# Patient Record
Sex: Male | Born: 1940 | Race: White | Hispanic: No | Marital: Married | Smoking: Never smoker
Health system: Southern US, Community
[De-identification: ages and names within clinical notes are randomized; demographics above are authoritative.]

## PROBLEM LIST (undated history)

## (undated) DIAGNOSIS — E785 Hyperlipidemia, unspecified: Secondary | ICD-10-CM

## (undated) DIAGNOSIS — K219 Gastro-esophageal reflux disease without esophagitis: Secondary | ICD-10-CM

## (undated) DIAGNOSIS — R350 Frequency of micturition: Secondary | ICD-10-CM

## (undated) DIAGNOSIS — R351 Nocturia: Secondary | ICD-10-CM

## (undated) DIAGNOSIS — R3915 Urgency of urination: Secondary | ICD-10-CM

## (undated) DIAGNOSIS — N400 Enlarged prostate without lower urinary tract symptoms: Secondary | ICD-10-CM

## (undated) DIAGNOSIS — Z8619 Personal history of other infectious and parasitic diseases: Secondary | ICD-10-CM

## (undated) DIAGNOSIS — I10 Essential (primary) hypertension: Secondary | ICD-10-CM

---

## 1996-05-05 HISTORY — PX: OTHER SURGICAL HISTORY: SHX169

## 1997-12-19 ENCOUNTER — Ambulatory Visit (HOSPITAL_BASED_OUTPATIENT_CLINIC_OR_DEPARTMENT_OTHER): Admission: RE | Admit: 1997-12-19 | Discharge: 1997-12-19 | Payer: Self-pay | Admitting: Orthopaedic Surgery

## 1998-09-17 ENCOUNTER — Emergency Department (HOSPITAL_COMMUNITY): Admission: EM | Admit: 1998-09-17 | Discharge: 1998-09-17 | Payer: Self-pay | Admitting: Emergency Medicine

## 1998-09-17 ENCOUNTER — Encounter: Payer: Self-pay | Admitting: Emergency Medicine

## 2001-02-09 ENCOUNTER — Encounter: Payer: Self-pay | Admitting: Family Medicine

## 2001-02-09 ENCOUNTER — Ambulatory Visit (HOSPITAL_COMMUNITY): Admission: RE | Admit: 2001-02-09 | Discharge: 2001-02-09 | Payer: Self-pay | Admitting: Family Medicine

## 2001-02-22 ENCOUNTER — Ambulatory Visit (HOSPITAL_COMMUNITY): Admission: RE | Admit: 2001-02-22 | Discharge: 2001-02-22 | Payer: Self-pay | Admitting: Family Medicine

## 2001-02-22 ENCOUNTER — Encounter: Payer: Self-pay | Admitting: Family Medicine

## 2005-03-10 ENCOUNTER — Encounter: Admission: RE | Admit: 2005-03-10 | Discharge: 2005-03-10 | Payer: Self-pay | Admitting: Family Medicine

## 2011-07-18 DIAGNOSIS — IMO0002 Reserved for concepts with insufficient information to code with codable children: Secondary | ICD-10-CM | POA: Diagnosis not present

## 2011-07-18 DIAGNOSIS — M1710 Unilateral primary osteoarthritis, unspecified knee: Secondary | ICD-10-CM | POA: Diagnosis not present

## 2011-07-18 DIAGNOSIS — M76899 Other specified enthesopathies of unspecified lower limb, excluding foot: Secondary | ICD-10-CM | POA: Diagnosis not present

## 2011-08-26 DIAGNOSIS — R350 Frequency of micturition: Secondary | ICD-10-CM | POA: Diagnosis not present

## 2011-08-26 DIAGNOSIS — R82998 Other abnormal findings in urine: Secondary | ICD-10-CM | POA: Diagnosis not present

## 2011-08-26 DIAGNOSIS — Z125 Encounter for screening for malignant neoplasm of prostate: Secondary | ICD-10-CM | POA: Diagnosis not present

## 2011-08-26 DIAGNOSIS — R3911 Hesitancy of micturition: Secondary | ICD-10-CM | POA: Diagnosis not present

## 2011-09-09 DIAGNOSIS — H2510 Age-related nuclear cataract, unspecified eye: Secondary | ICD-10-CM | POA: Diagnosis not present

## 2011-10-03 DIAGNOSIS — N401 Enlarged prostate with lower urinary tract symptoms: Secondary | ICD-10-CM | POA: Diagnosis not present

## 2011-11-26 DIAGNOSIS — N401 Enlarged prostate with lower urinary tract symptoms: Secondary | ICD-10-CM | POA: Diagnosis not present

## 2011-11-28 DIAGNOSIS — N3000 Acute cystitis without hematuria: Secondary | ICD-10-CM | POA: Diagnosis not present

## 2011-11-28 DIAGNOSIS — R339 Retention of urine, unspecified: Secondary | ICD-10-CM | POA: Diagnosis not present

## 2011-11-28 DIAGNOSIS — N401 Enlarged prostate with lower urinary tract symptoms: Secondary | ICD-10-CM | POA: Diagnosis not present

## 2011-12-30 DIAGNOSIS — N401 Enlarged prostate with lower urinary tract symptoms: Secondary | ICD-10-CM | POA: Diagnosis not present

## 2011-12-30 DIAGNOSIS — R339 Retention of urine, unspecified: Secondary | ICD-10-CM | POA: Diagnosis not present

## 2012-01-06 DIAGNOSIS — N401 Enlarged prostate with lower urinary tract symptoms: Secondary | ICD-10-CM | POA: Diagnosis not present

## 2012-01-06 DIAGNOSIS — R339 Retention of urine, unspecified: Secondary | ICD-10-CM | POA: Diagnosis not present

## 2012-01-07 ENCOUNTER — Other Ambulatory Visit: Payer: Self-pay | Admitting: Urology

## 2012-02-03 ENCOUNTER — Encounter (HOSPITAL_BASED_OUTPATIENT_CLINIC_OR_DEPARTMENT_OTHER): Payer: Self-pay | Admitting: *Deleted

## 2012-02-03 NOTE — Progress Notes (Signed)
NPO AFTER MN. ARRVIES AT 0715. NEEDS ISTAT AND EKG. WILL TAKE NEXIUM AND NORVASC AM OF SURG W/ SIP OF WATER.

## 2012-02-05 ENCOUNTER — Other Ambulatory Visit: Payer: Self-pay

## 2012-02-05 ENCOUNTER — Ambulatory Visit (HOSPITAL_BASED_OUTPATIENT_CLINIC_OR_DEPARTMENT_OTHER): Payer: Medicare Other | Admitting: Anesthesiology

## 2012-02-05 ENCOUNTER — Encounter (HOSPITAL_BASED_OUTPATIENT_CLINIC_OR_DEPARTMENT_OTHER): Payer: Self-pay | Admitting: Anesthesiology

## 2012-02-05 ENCOUNTER — Observation Stay (HOSPITAL_BASED_OUTPATIENT_CLINIC_OR_DEPARTMENT_OTHER)
Admission: RE | Admit: 2012-02-05 | Discharge: 2012-02-07 | Disposition: A | Discharge status: Home / Self Care | Payer: Medicare Other | Source: Ambulatory Visit | Attending: Urology | Admitting: Urology

## 2012-02-05 ENCOUNTER — Encounter (HOSPITAL_COMMUNITY)
Admission: RE | Disposition: A | Discharge status: Home / Self Care | Payer: Self-pay | Source: Ambulatory Visit | Attending: Urology

## 2012-02-05 ENCOUNTER — Encounter (HOSPITAL_BASED_OUTPATIENT_CLINIC_OR_DEPARTMENT_OTHER): Payer: Self-pay | Admitting: *Deleted

## 2012-02-05 DIAGNOSIS — Z23 Encounter for immunization: Secondary | ICD-10-CM | POA: Diagnosis not present

## 2012-02-05 DIAGNOSIS — K219 Gastro-esophageal reflux disease without esophagitis: Secondary | ICD-10-CM | POA: Diagnosis not present

## 2012-02-05 DIAGNOSIS — N400 Enlarged prostate without lower urinary tract symptoms: Secondary | ICD-10-CM

## 2012-02-05 DIAGNOSIS — E785 Hyperlipidemia, unspecified: Secondary | ICD-10-CM | POA: Diagnosis not present

## 2012-02-05 DIAGNOSIS — Q549 Hypospadias, unspecified: Secondary | ICD-10-CM | POA: Diagnosis not present

## 2012-02-05 DIAGNOSIS — N401 Enlarged prostate with lower urinary tract symptoms: Principal | ICD-10-CM | POA: Insufficient documentation

## 2012-02-05 DIAGNOSIS — E669 Obesity, unspecified: Secondary | ICD-10-CM | POA: Insufficient documentation

## 2012-02-05 DIAGNOSIS — N312 Flaccid neuropathic bladder, not elsewhere classified: Secondary | ICD-10-CM | POA: Diagnosis not present

## 2012-02-05 DIAGNOSIS — I10 Essential (primary) hypertension: Secondary | ICD-10-CM | POA: Diagnosis not present

## 2012-02-05 DIAGNOSIS — R339 Retention of urine, unspecified: Secondary | ICD-10-CM | POA: Diagnosis not present

## 2012-02-05 DIAGNOSIS — Z79899 Other long term (current) drug therapy: Secondary | ICD-10-CM | POA: Diagnosis not present

## 2012-02-05 HISTORY — DX: Gastro-esophageal reflux disease without esophagitis: K21.9

## 2012-02-05 HISTORY — DX: Urgency of urination: R39.15

## 2012-02-05 HISTORY — DX: Essential (primary) hypertension: I10

## 2012-02-05 HISTORY — DX: Nocturia: R35.1

## 2012-02-05 HISTORY — DX: Personal history of other infectious and parasitic diseases: Z86.19

## 2012-02-05 HISTORY — DX: Hyperlipidemia, unspecified: E78.5

## 2012-02-05 HISTORY — DX: Frequency of micturition: R35.0

## 2012-02-05 HISTORY — PX: TRANSURETHRAL RESECTION OF PROSTATE: SHX73

## 2012-02-05 HISTORY — DX: Benign prostatic hyperplasia without lower urinary tract symptoms: N40.0

## 2012-02-05 LAB — POCT I-STAT, CHEM 8
BUN: 12 mg/dL (ref 6–23)
Calcium, Ion: 1.25 mmol/L (ref 1.13–1.30)
Chloride: 105 mEq/L (ref 96–112)
Creatinine, Ser: 0.9 mg/dL (ref 0.50–1.35)
Glucose, Bld: 112 mg/dL — ABNORMAL HIGH (ref 70–99)
HCT: 43 % (ref 39.0–52.0)
Hemoglobin: 14.6 g/dL (ref 13.0–17.0)
Potassium: 3.8 mEq/L (ref 3.5–5.1)
Sodium: 143 mEq/L (ref 135–145)
TCO2: 23 mmol/L (ref 0–100)

## 2012-02-05 SURGERY — TRANSURETHRAL RESECTION OF THE PROSTATE WITH GYRUS INSTRUMENTS
Anesthesia: General | Site: Bladder | Wound class: Clean Contaminated

## 2012-02-05 MED ORDER — LACTATED RINGERS IV SOLN
INTRAVENOUS | Status: DC
  Administered 2012-02-05 (×2): via INTRAVENOUS

## 2012-02-05 MED ORDER — DIPHENHYDRAMINE HCL 12.5 MG/5ML PO ELIX: 12.5000 mg | ORAL_SOLUTION | Freq: Four times a day (QID) | ORAL | Status: DC | PRN

## 2012-02-05 MED ORDER — CEFAZOLIN SODIUM-DEXTROSE 2-3 GM-% IV SOLR
2.0000 g | INTRAVENOUS | Status: AC
  Administered 2012-02-05: 2 g via INTRAVENOUS

## 2012-02-05 MED ORDER — GLYCOPYRROLATE 0.2 MG/ML IJ SOLN
INTRAMUSCULAR | Status: DC | PRN
  Administered 2012-02-05: 0.2 mg via INTRAVENOUS

## 2012-02-05 MED ORDER — ZOLPIDEM TARTRATE 5 MG PO TABS
5.0000 mg | ORAL_TABLET | Freq: Every evening | ORAL | Status: DC | PRN
  Administered 2012-02-05 – 2012-02-06 (×2): 5 mg via ORAL
  Filled 2012-02-05 (×2): qty 1

## 2012-02-05 MED ORDER — TAMSULOSIN HCL 0.4 MG PO CAPS
0.4000 mg | ORAL_CAPSULE | Freq: Every day | ORAL | Status: DC
  Administered 2012-02-05 – 2012-02-06 (×2): 0.4 mg via ORAL
  Filled 2012-02-05 (×3): qty 1

## 2012-02-05 MED ORDER — AMLODIPINE BESYLATE 5 MG PO TABS
5.0000 mg | ORAL_TABLET | Freq: Every morning | ORAL | Status: DC
  Administered 2012-02-05 – 2012-02-07 (×3): 5 mg via ORAL
  Filled 2012-02-05 (×3): qty 1

## 2012-02-05 MED ORDER — PANTOPRAZOLE SODIUM 40 MG PO TBEC
80.0000 mg | DELAYED_RELEASE_TABLET | Freq: Every day | ORAL | Status: DC
  Administered 2012-02-06 – 2012-02-07 (×2): 80 mg via ORAL
  Filled 2012-02-05 (×4): qty 2

## 2012-02-05 MED ORDER — FENTANYL CITRATE 0.05 MG/ML IJ SOLN
INTRAMUSCULAR | Status: DC | PRN
  Administered 2012-02-05 (×2): 25 ug via INTRAVENOUS
  Administered 2012-02-05: 50 ug via INTRAVENOUS

## 2012-02-05 MED ORDER — DIPHENHYDRAMINE HCL 50 MG/ML IJ SOLN: 12.5000 mg | Freq: Four times a day (QID) | INTRAMUSCULAR | Status: DC | PRN

## 2012-02-05 MED ORDER — INFLUENZA VIRUS VACC SPLIT PF IM SUSP
0.5000 mL | INTRAMUSCULAR | Status: AC
Start: 2012-02-06 — End: 2012-02-06
  Administered 2012-02-06: 0.5 mL via INTRAMUSCULAR
  Filled 2012-02-05: qty 0.5

## 2012-02-05 MED ORDER — CYCLOBENZAPRINE HCL 10 MG PO TABS
10.0000 mg | ORAL_TABLET | Freq: Three times a day (TID) | ORAL | Status: DC | PRN
  Filled 2012-02-05: qty 1

## 2012-02-05 MED ORDER — LIDOCAINE HCL (CARDIAC) 20 MG/ML IV SOLN
INTRAVENOUS | Status: DC | PRN
  Administered 2012-02-05: 80 mg via INTRAVENOUS

## 2012-02-05 MED ORDER — TEMAZEPAM 15 MG PO CAPS: 15.0000 mg | ORAL_CAPSULE | Freq: Every day | ORAL | Status: DC

## 2012-02-05 MED ORDER — FENTANYL CITRATE 0.05 MG/ML IJ SOLN
25.0000 ug | INTRAMUSCULAR | Status: DC | PRN
  Administered 2012-02-05 (×2): 25 ug via INTRAVENOUS

## 2012-02-05 MED ORDER — BISACODYL 5 MG PO TBEC: 5.0000 mg | DELAYED_RELEASE_TABLET | Freq: Every day | ORAL | Status: DC | PRN

## 2012-02-05 MED ORDER — MIDAZOLAM HCL 5 MG/5ML IJ SOLN
INTRAMUSCULAR | Status: DC | PRN
  Administered 2012-02-05: 1 mg via INTRAVENOUS

## 2012-02-05 MED ORDER — SIMVASTATIN 40 MG PO TABS
40.0000 mg | ORAL_TABLET | Freq: Every evening | ORAL | Status: DC
  Administered 2012-02-05: 40 mg via ORAL
  Filled 2012-02-05 (×2): qty 1

## 2012-02-05 MED ORDER — ONDANSETRON HCL 4 MG/2ML IJ SOLN: 4.0000 mg | INTRAMUSCULAR | Status: DC | PRN

## 2012-02-05 MED ORDER — HYDROCODONE-ACETAMINOPHEN 5-325 MG PO TABS
1.0000 | ORAL_TABLET | ORAL | Status: DC | PRN
Start: 2012-02-05 — End: 2012-02-07

## 2012-02-05 MED ORDER — PROPOFOL 10 MG/ML IV BOLUS
INTRAVENOUS | Status: DC | PRN
  Administered 2012-02-05: 150 mg via INTRAVENOUS

## 2012-02-05 MED ORDER — DEXTROSE-NACL 5-0.45 % IV SOLN
INTRAVENOUS | Status: DC
  Administered 2012-02-05 – 2012-02-06 (×3): via INTRAVENOUS

## 2012-02-05 MED ORDER — DEXAMETHASONE SODIUM PHOSPHATE 4 MG/ML IJ SOLN
INTRAMUSCULAR | Status: DC | PRN
  Administered 2012-02-05: 8 mg via INTRAVENOUS

## 2012-02-05 MED ORDER — BELLADONNA ALKALOIDS-OPIUM 16.2-60 MG RE SUPP
RECTAL | Status: DC | PRN
  Administered 2012-02-05: 1 via RECTAL

## 2012-02-05 MED ORDER — OXYBUTYNIN CHLORIDE 5 MG PO TABS
5.0000 mg | ORAL_TABLET | Freq: Three times a day (TID) | ORAL | Status: DC | PRN
  Administered 2012-02-05: 5 mg via ORAL
  Filled 2012-02-05 (×2): qty 1

## 2012-02-05 MED ORDER — CIPROFLOXACIN HCL 500 MG PO TABS
500.0000 mg | ORAL_TABLET | Freq: Two times a day (BID) | ORAL | Status: DC
  Administered 2012-02-05 – 2012-02-07 (×4): 500 mg via ORAL
  Filled 2012-02-05 (×6): qty 1

## 2012-02-05 MED ORDER — PANTOPRAZOLE SODIUM 40 MG PO TBEC: 40.0000 mg | DELAYED_RELEASE_TABLET | Freq: Every day | ORAL | Status: DC

## 2012-02-05 MED ORDER — PROMETHAZINE HCL 25 MG/ML IJ SOLN: 6.2500 mg | INTRAMUSCULAR | Status: DC | PRN

## 2012-02-05 MED ORDER — ACETAMINOPHEN 10 MG/ML IV SOLN
INTRAVENOUS | Status: DC | PRN
  Administered 2012-02-05: 1000 mg via INTRAVENOUS

## 2012-02-05 MED ORDER — HYDROMORPHONE HCL PF 1 MG/ML IJ SOLN: 0.5000 mg | INTRAMUSCULAR | Status: DC | PRN

## 2012-02-05 MED ORDER — ONDANSETRON HCL 4 MG/2ML IJ SOLN
INTRAMUSCULAR | Status: DC | PRN
  Administered 2012-02-05: 4 mg via INTRAVENOUS

## 2012-02-05 MED ORDER — BACITRACIN-NEOMYCIN-POLYMYXIN 400-5-5000 EX OINT: 1.0000 "application " | TOPICAL_OINTMENT | Freq: Three times a day (TID) | CUTANEOUS | Status: DC | PRN

## 2012-02-05 MED ORDER — SODIUM CHLORIDE 0.9 % IR SOLN
Status: DC | PRN
  Administered 2012-02-05: 6000 mL via INTRAVESICAL

## 2012-02-05 SURGICAL SUPPLY — 44 items
BAG DRAIN URO-CYSTO SKYTR STRL (DRAIN) ×2 IMPLANT
BAG URINE DRAINAGE (UROLOGICAL SUPPLIES) ×2 IMPLANT
BAG URINE LEG 19OZ MD ST LTX (BAG) ×2 IMPLANT
BLADE SURG 15 STRL LF DISP TIS (BLADE) IMPLANT
BLADE SURG 15 STRL SS (BLADE)
BOOTIES KNEE HIGH SLOAN (MISCELLANEOUS) ×2 IMPLANT
CANISTER SUCT LVC 12 LTR MEDI- (MISCELLANEOUS) ×6 IMPLANT
CATH AINSWORTH 30CC 24FR (CATHETERS) IMPLANT
CATH FOLEY 2WAY  5CC 16FR SIL (CATHETERS) ×1
CATH FOLEY 2WAY 5CC 16FR SIL (CATHETERS) ×1 IMPLANT
CATH FOLEY 2WAY SLVR  5CC 14FR (CATHETERS)
CATH FOLEY 2WAY SLVR  5CC 20FR (CATHETERS)
CATH FOLEY 2WAY SLVR 5CC 14FR (CATHETERS) IMPLANT
CATH FOLEY 2WAY SLVR 5CC 20FR (CATHETERS) IMPLANT
CATH HEMA 3WAY 30CC 24FR COUDE (CATHETERS) IMPLANT
CATH HEMA 3WAY 30CC 24FR RND (CATHETERS) ×2 IMPLANT
CATH SIMPLASTIC 24 30ML (CATHETERS) IMPLANT
CLOTH BEACON ORANGE TIMEOUT ST (SAFETY) ×2 IMPLANT
DRAPE CAMERA CLOSED 9X96 (DRAPES) ×2 IMPLANT
DRSG TEGADERM 4X4.75 (GAUZE/BANDAGES/DRESSINGS) ×6 IMPLANT
ELECT BUTTON HF 24-28F 2 30DE (ELECTRODE) IMPLANT
ELECT LOOP HF 24-28F (CUTTING LOOP) IMPLANT
ELECT LOOP HF 26F 30D .35MM (CUTTING LOOP) IMPLANT
ELECT LOOP MED HF 24F 12D CBL (CLIP) ×2 IMPLANT
ELECT NEEDLE 45D HF 24-28F 12D (CUTTING LOOP) IMPLANT
ELECT REM PT RETURN 9FT ADLT (ELECTROSURGICAL)
ELECTRODE REM PT RTRN 9FT ADLT (ELECTROSURGICAL) IMPLANT
GLOVE BIO SURGEON STRL SZ 6.5 (GLOVE) ×2 IMPLANT
GLOVE BIO SURGEON STRL SZ7.5 (GLOVE) ×2 IMPLANT
GLOVE ECLIPSE 6.0 STRL STRAW (GLOVE) ×2 IMPLANT
GOWN PREVENTION PLUS LG XLONG (DISPOSABLE) ×2 IMPLANT
GOWN STRL REIN XL XLG (GOWN DISPOSABLE) ×2 IMPLANT
HOLDER FOLEY CATH W/STRAP (MISCELLANEOUS) ×2 IMPLANT
IV NS IRRIG 3000ML ARTHROMATIC (IV SOLUTION) ×16 IMPLANT
KIT ASPIRATION TUBING (SET/KITS/TRAYS/PACK) ×2 IMPLANT
LOOP CUTTING 24FR OLYMPUS (CUTTING LOOP) IMPLANT
PACK CYSTOSCOPY (CUSTOM PROCEDURE TRAY) ×2 IMPLANT
PLUG CATH AND CAP STER (CATHETERS) ×2 IMPLANT
SET ASPIRATION TUBING (TUBING) IMPLANT
SPONGE GAUZE 4X4 12PLY (GAUZE/BANDAGES/DRESSINGS) ×2 IMPLANT
SUT ETHILON 3 0 PS 1 (SUTURE) IMPLANT
SUT SILK 0 TIES 10X30 (SUTURE) ×2 IMPLANT
SYR 30ML LL (SYRINGE) IMPLANT
SYRINGE IRR TOOMEY STRL 70CC (SYRINGE) ×2 IMPLANT

## 2012-02-05 NOTE — Anesthesia Procedure Notes (Signed)
Procedure Name: LMA Insertion Date/Time: 02/05/2012 8:55 AM Performed by: Norva Pavlov Pre-anesthesia Checklist: Patient identified, Emergency Drugs available, Suction available and Patient being monitored Patient Re-evaluated:Patient Re-evaluated prior to inductionOxygen Delivery Method: Circle System Utilized Preoxygenation: Pre-oxygenation with 100% oxygen Intubation Type: IV induction Ventilation: Mask ventilation without difficulty LMA: LMA inserted LMA Size: 4.0 Number of attempts: 1 Airway Equipment and Method: bite block Placement Confirmation: positive ETCO2 Tube secured with: Tape Dental Injury: Teeth and Oropharynx as per pre-operative assessment

## 2012-02-05 NOTE — Anesthesia Preprocedure Evaluation (Addendum)
Anesthesia Evaluation  Patient identified by MRN, date of birth, ID band Patient awake    Reviewed: Allergy & Precautions, H&P , NPO status , Patient's Chart, lab work & pertinent test results  Airway Mallampati: II TM Distance: >3 FB Neck ROM: Full    Dental  (+) Upper Dentures and Lower Dentures   Pulmonary neg pulmonary ROS,  breath sounds clear to auscultation  Pulmonary exam normal       Cardiovascular hypertension, Pt. on medications negative cardio ROS  Rhythm:Regular Rate:Normal     Neuro/Psych negative neurological ROS  negative psych ROS   GI/Hepatic Neg liver ROS, GERD-  Medicated,  Endo/Other  negative endocrine ROS  Renal/GU negative Renal ROS  negative genitourinary   Musculoskeletal negative musculoskeletal ROS (+)   Abdominal (+) + obese,   Peds negative pediatric ROS (+)  Hematology negative hematology ROS (+)   Anesthesia Other Findings   Reproductive/Obstetrics negative OB ROS                          Anesthesia Physical Anesthesia Plan  ASA: II  Anesthesia Plan: General   Post-op Pain Management:    Induction: Intravenous  Airway Management Planned: LMA  Additional Equipment:   Intra-op Plan:   Post-operative Plan: Extubation in OR  Informed Consent: I have reviewed the patients History and Physical, chart, labs and discussed the procedure including the risks, benefits and alternatives for the proposed anesthesia with the patient or authorized representative who has indicated his/her understanding and acceptance.   Dental advisory given  Plan Discussed with: CRNA  Anesthesia Plan Comments:         Anesthesia Quick Evaluation

## 2012-02-05 NOTE — Interval H&P Note (Signed)
History and Physical Interval Note:  02/05/2012 8:49 AM  Philip Allen  has presented today for surgery, with the diagnosis of BPH  The various methods of treatment have been discussed with the patient and family. After consideration of risks, benefits and other options for treatment, the patient has consented to  Procedure(s) (LRB) with comments: TRANSURETHRAL RESECTION OF THE PROSTATE WITH GYRUS INSTRUMENTS (N/A) - WITH GYRUS SUPRAPUBIC TUBE OWER TO MAIN as a surgical intervention .  The patient's history has been reviewed, patient examined, no change in status, stable for surgery.  I have reviewed the patient's chart and labs.  Questions were answered to the patient's satisfaction.     Jethro Bolus I

## 2012-02-05 NOTE — Transfer of Care (Signed)
Immediate Anesthesia Transfer of Care Note  Patient: Philip Allen  Procedure(s) Performed: Procedure(s) (LRB): TRANSURETHRAL RESECTION OF THE PROSTATE WITH GYRUS INSTRUMENTS (N/A)  Patient Location: PACU  Anesthesia Type: General  Level of Consciousness: drowsy, follows commands, oriented x3.  Airway & Oxygen Therapy: Patient Spontanous Breathing and Patient connected to face mask oxygen  Post-op Assessment: Report given to PACU RN and Post -op Vital signs reviewed and stable  Post vital signs: Reviewed and stable  Complications: No apparent anesthesia complications

## 2012-02-05 NOTE — H&P (Signed)
Chief Complaint  cc: Dr. Felipa Eth   Reason For Visit  F/u to review urodynamics & Renal u/s   Active Problems Problems  1. 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 Incomplete Emptying Of Bladder 788.21  History of Present Illness       71 year old male returns today to review urodynamic results & Renal u/s.  Hx of a 10 year history of urinary frequency, hesitancy, incomplete emptying, straining to urinate, weakened urinary flow, nocturia-despite taking Flomax for 3 years. He has been treated for possible prostatitis with Cipro 500 mg b.i.d. for 14 days. IPSS 21/7. He has failed a Rapaflo. PSA is 1.99 urine culture is negative. His past history is significant for elevated PVR of 800 cc without discomfort.  Physical examination shows normal sphincter tone with prostate size 3+. Repeat PSA is 2.3. Flow rate is 6 cc per second. PVR = 186 cc. The patient was felt to have probable obstruction, and is referred for urodynamics for evaluation.  Video urodynamics is accomplished on 12/30/11, in the sitting position. The maximum cystometric capacity is 1255 cc. The bladder is hyposensitive. First sensation occurs at 925 cc, and normal desire at 1118 cc. Strong desire to void occurs at 1118 cc. The bladder comes slightly unstable, and the patient is able to void off of these unstable bladder contractions.  The patient did not leak for leak point determination and abdominal pressure generation of 141 cm water.  Pressure flow study showed a maximum flow rate of 11 cc per second, with detrusor pressure at maximum flow of 100 cm water. He has some loss of compliance. PVR appears to be 1000 cc. He voids with an obstructed flow pattern. There is trabeculation, and elevation of the bladder base found.  Review of the Leisa Lenz nomogram shows obstruction. The patient is polyp self intermittent catheterization. He has definite large hyposensitive bladder.   Past Medical  History Problems  1. 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 History of  Esophageal Reflux 530.81 3. History of  Hepatitis 573.3 4. History of  Hyperlipidemia 272.4 5. History of  Hypertension 401.9  Surgical History Problems  1. History of  Rotator Cuff Repair Right  Current Meds 1. Amoxicillin-Pot Clavulanate 500-125 MG Oral Tablet; TAKE 3 TABLET 3 times daily; Therapy:  30Jul2013 to (Evaluate:09Aug2013)  Requested for: 30Jul2013; Last Rx:30Jul2013 2. Aspirin 81 MG Oral Tablet; Therapy: (Recorded:31May2013) to 3. Cyclobenzaprine HCl 10 MG Oral Tablet; Therapy: (Recorded:31May2013) to 4. Finasteride 5 MG Oral Tablet; TAKE ONE TABLET BY MOUTH DAILY; Therapy: 26Jul2013 to  (Evaluate:21Jul2014)  Requested for: 26Jul2013; Last Rx:26Jul2013 5. Flomax 0.4 MG Oral Capsule; Therapy: (Recorded:31May2013) to 6. Mobic 15 MG Oral Tablet; Therapy: (Recorded:31May2013) to 7. NexIUM 40 MG Oral Packet; Therapy: (Recorded:31May2013) to 8. Norvasc 5 MG Oral Tablet; Therapy: (Recorded:31May2013) to 9. Rapaflo 8 MG Oral Capsule; TAKE 1 CAPSULE DAILY WITH FOOD; Therapy: 31May2013 to  (Evaluate:07Oct2013); Last Rx:09Jul2013 10. Sulfamethoxazole-TMP DS 800-160 MG Oral Tablet; 1 tablet BID x 10 days, then 1/2 tablet daily x 1   mo; Therapy: 26Jul2013 to (Last Rx:26Jul2013)  Requested for: 26Jul2013 11. Temazepam 15 MG Oral Capsule; Therapy: (Recorded:31May2013) to 12. Zocor 40 MG Oral Tablet; Therapy: (Recorded:31May2013) to  Allergies Medication  1. No Known Drug Allergies  Family History Problems  1. Family history of  Family Health Status Number Of Children 2 girls and 1 boy 2. Family history of  Father Deceased At Age 48 heart disease 3. Family history of  Mother Deceased At Age  84 heart disease  Social History Problems  1. Alcohol Use 2 per week 2. Caffeine Use 3 per day 3. Marital History - Single 4. Never A Smoker 5. Occupation: Retired Denied  6. History of  Tobacco  Use  Review of Systems Genitourinary, constitutional, skin, eye, otolaryngeal, hematologic/lymphatic, cardiovascular, pulmonary, endocrine, musculoskeletal, gastrointestinal, neurological and psychiatric system(s) were reviewed and pertinent findings if present are noted.  Genitourinary: urinary frequency, difficulty starting the urinary stream, weak urinary stream, incomplete emptying of bladder, penile pain and initiating urination requires straining.  Musculoskeletal: back pain and joint pain.    Vitals Vital Signs [Data Includes: Last 1 Day]  03Sep2013 08:26AM  Blood Pressure: 155 / 79 Temperature: 97.9 F Heart Rate: 59  Results/Data Urine [Data Includes: Last 1 Day]   03Sep2013  COLOR YELLOW   APPEARANCE CLEAR   SPECIFIC GRAVITY 1.010   pH 7.0   GLUCOSE NEG mg/dL  BILIRUBIN NEG   KETONE NEG mg/dL  BLOOD NEG   PROTEIN NEG mg/dL  UROBILINOGEN 0.2 mg/dL  NITRITE NEG   LEUKOCYTE ESTERASE NEG    Assessment Assessed  1. 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 Incomplete Emptying Of Bladder 788.21      49.38cc gland size, with 6-12cc max flow rate, and urodynamic diagnosis of outflow obstruction, with hypotonic bladder, and elevated pvr. Cysto shows median lobe with elevated bladder neck. Urodynamics shows hypotonic bladder with markedly elevated pvr.   We have reviewed options for treatment. He has failed medical theapy, and needs relief of his obstruction, then it will take several weeks to months to try to get his bladder to return to normal. However, has normal neurologic status, and normal eye-hand coordination, and normal bowels, and I would anticipate that he has a good chance to recover his bladder function.  Mr. Chaikin needs cysto,Gyrus TURP and S-P tube. Then he will need PT post op.   Plan  Gyrus TUR-P and s-p tube.   Signatures Electronically signed by : Jethro Bolus, M.D.; Jan 06 2012  9:26AM

## 2012-02-05 NOTE — Anesthesia Postprocedure Evaluation (Signed)
  Anesthesia Post-op Note  Patient: Philip Allen  Procedure(s) Performed: Procedure(s) (LRB): TRANSURETHRAL RESECTION OF THE PROSTATE WITH GYRUS INSTRUMENTS (N/A)  Patient Location: PACU  Anesthesia Type: General  Level of Consciousness: awake and alert   Airway and Oxygen Therapy: Patient Spontanous Breathing  Post-op Pain: mild  Post-op Assessment: Post-op Vital signs reviewed, Patient's Cardiovascular Status Stable, Respiratory Function Stable, Patent Airway and No signs of Nausea or vomiting  Post-op Vital Signs: stable  Complications: No apparent anesthesia complications

## 2012-02-05 NOTE — Op Note (Signed)
Pre-operative diagnosis : BPH with hypotonic bladder Postoperative diagnosis: Same  Operation: Cystourethroscopy, suprapubic tube placement, TURP  Surgeon:  S. Patsi Sears, MD  First assistant: None  [id number]}  Preparation: After appropriate preanesthesia, the patient was brought to the operating room, placed on the operating table in the dorsal supine position where general LMA anesthesia was introduced. He was replaced on the operating table in the dorsal lithotomy position with pubis was prepped with Betadine solution and draped in usual fashion. The arm band was double checked.  Review history:71 year old male returns today to review urodynamic results & Renal u/s. Hx of a 10 year history of urinary frequency, hesitancy, incomplete emptying, straining to urinate, weakened urinary flow, nocturia-despite taking Flomax for 3 years. He has been treated for possible prostatitis with Cipro 500 mg b.i.d. for 14 days. IPSS 21/7. He has failed a Rapaflo. PSA is 1.99 urine culture is negative. His past history is significant for elevated PVR of 800 cc without discomfort.  Physical examination shows normal sphincter tone with prostate size 3+. Repeat PSA is 2.3. Flow rate is 6 cc per second. PVR = 186 cc. The patient was felt to have probable obstruction, and is referred for urodynamics for evaluation.  Video urodynamics is accomplished on 12/30/11, in the sitting position. The maximum cystometric capacity is 1255 cc. The bladder is hyposensitive. First sensation occurs at 925 cc, and normal desire at 1118 cc. Strong desire to void occurs at 1118 cc. The bladder comes slightly unstable, and the patient is able to void off of these unstable bladder contractions.  The patient did not leak for leak point determination and abdominal pressure generation of 141 cm water.  Pressure flow study showed a maximum flow rate of 11 cc per second, with detrusor pressure at maximum flow of 100 cm water. He has  some loss of compliance. PVR appears to be 1000 cc. He voids with an obstructed flow pattern. There is trabeculation, and elevation of the bladder base found.  Review of the Leisa Lenz nomogram shows obstruction. The patient is  has definite large hyposensitive bladder. He is for TURP and S-P tube, followed by PT, and bladder re-training.     Statement of  Likelihood of Success: Excellent. TIME-OUT observed.:  Procedure: Inspection of the penis revealed that the patient had distal shaft hypospadias, requiring urethral dilation to a size 26 [place] prior to introduction of the continuous flow resectoscope. This was accomplished, and the obturator removed. Photodocumentation of very obstructed prostatic fossa was noted, with bilobar BPH, and greatly enlarged median lobe. Trabeculation was photo documented. There was no evidence of bladder stone or tumor noted. The ureteral orifices were identified in normal position on the trigone.  The patient was placed in Trendelenburg position. The scope was removed. A Lowsley tractor was placed. Using a 15 blade, cutdown was accomplished where the Lowsley could be palpated through the skin, and the midline. The Lowsley was brought into the wound, and a 16 [place] Silastic catheter was placed in the urethra antegrade. The Lowsley was then removed, and the continuous flow resectoscope was placed within the urethra, and under direct vision, the Foley catheter was brought retrograde into the bladder. Once in the bladder, 10 cc of sterile water was placed in the balloon. At the end of the procedure, a 3-0 nylon suture was used to secure the suprapubic catheter to the skin.  Following placement of the SP tube, attention was directed to resection of the prostate. Resection was begun at the 7:00  position, carried to the 5:00 position, from the 1:00 position to the 6:00 position, and from the 11:00 to the 6 clock position. Chips were sent to laboratory for evaluation. The  resection was noted to be quite bloody, extensive cauterization was used. Irrigation was accomplished, and a 24 [place] hematuria catheter, was placed, with continuous irrigation, and traction. The patient was awakened and taken recovery room in good condition. He received IV Tylenol prior to the surgery.

## 2012-02-06 ENCOUNTER — Encounter (HOSPITAL_BASED_OUTPATIENT_CLINIC_OR_DEPARTMENT_OTHER): Payer: Self-pay | Admitting: Urology

## 2012-02-06 LAB — HEMOGLOBIN AND HEMATOCRIT, BLOOD
HCT: 32 % — ABNORMAL LOW (ref 39.0–52.0)
Hemoglobin: 11.4 g/dL — ABNORMAL LOW (ref 13.0–17.0)

## 2012-02-06 MED ORDER — CIPROFLOXACIN HCL 500 MG PO TABS: 500.0000 mg | ORAL_TABLET | Freq: Two times a day (BID) | ORAL | Status: DC

## 2012-02-06 MED ORDER — ATORVASTATIN CALCIUM 20 MG PO TABS
20.0000 mg | ORAL_TABLET | Freq: Every day | ORAL | Status: DC
  Administered 2012-02-06: 20 mg via ORAL
  Filled 2012-02-06 (×2): qty 1

## 2012-02-06 MED ORDER — HYDROCODONE-ACETAMINOPHEN 7.5-650 MG PO TABS: 1.0000 | ORAL_TABLET | Freq: Four times a day (QID) | ORAL | Status: DC | PRN

## 2012-02-06 NOTE — Discharge Summary (Signed)
Physician Discharge Summary  Patient ID: KYM FENTER MRN: [id number] DOB/AGE: 08-18-1940 71 y.o.  Admit date: 02/05/2012 Discharge date: 02/06/2012  Admission Diagnoses: Hypotonic bladder, chronic urinary obstruction  Discharge Diagnoses:  Hypotonic bladder Bladder outlet obstruction  Discharged Condition: good  Hospital Course: TURP/S-P tube   Consults: None  Significant Diagnostic Studies: labs: ( see labs)  Treatments: IV hydration, antibiotics: Ancef and surgery: TURP/S-P tube  Discharge Exam: Blood pressure 136/50, pulse 72, temperature 98.5 F (36.9 C), temperature source Oral, resp. rate 16, height 5\' 7"  (1.702 m), weight 89.35 kg (196 lb 15.7 oz), SpO2 95.00%. General appearance: alert, cooperative and appears stated age  Disposition:   For D/C Saturday with s-p training.      Medication List     As of 02/06/2012  1:40 PM    ASK your doctor about these medications         amLODipine 5 MG tablet   Commonly known as: NORVASC   Take 5 mg by mouth every morning.      aspirin 81 MG tablet   Take 81 mg by mouth every morning.      cyclobenzaprine 10 MG tablet   Commonly known as: FLEXERIL   Take 10 mg by mouth 3 (three) times daily as needed.      esomeprazole 40 MG capsule   Commonly known as: NEXIUM   Take 40 mg by mouth daily before breakfast.      silodosin 4 MG Caps capsule   Commonly known as: RAPAFLO   Take 8 mg by mouth daily with breakfast.      simvastatin 40 MG tablet   Commonly known as: ZOCOR   Take 40 mg by mouth every evening.      temazepam 15 MG capsule   Commonly known as: RESTORIL   Take 15 mg by mouth at bedtime.           Follow-up Information    Follow up with Jethro Bolus I, MD. (per appointment)    Contact information:   [address] [place] [address] [phone]          Signed: Jethro Bolus I 02/06/2012, 1:40 PM

## 2012-02-06 NOTE — Care Management Note (Addendum)
    Page 1 of 1   02/06/2012     11:22:29 AM   CARE MANAGEMENT NOTE 02/06/2012  Patient:  Philip Allen, Philip Allen   Account Number:  [id number]  Date Initiated:  02/06/2012  Documentation initiated by:  Lanier Clam  Subjective/Objective Assessment:   ADMITTED W/PROSTATE HYPERPLASIA     Action/Plan:   FROM HOME   Anticipated DC Date:  02/06/2012   Anticipated DC Plan:  HOME/SELF CARE         Choice offered to / List presented to:             Status of service:  Completed, signed off Medicare Important Message given?   (If response is "NO", the following Medicare IM given date fields will be blank) Date Medicare IM given:   Date Additional Medicare IM given:    Discharge Disposition:  HOME/SELF CARE  Per UR Regulation:  Reviewed for med. necessity/level of care/duration of stay  If discussed at Long Length of Stay Meetings, dates discussed:    Comments:

## 2012-02-06 NOTE — Progress Notes (Signed)
Urology Progress Note  1 Day Post-Op   Subjective: no clots, but mild hematuria. No s-p teaching yet.     No acute urologic events overnight. Ambulation:   positive Flatus:    negative Bowel movement  negative  Pain: some relief  Objective:  Blood pressure 136/50, pulse 72, temperature 98.5 F (36.9 C), temperature source Oral, resp. rate 16, height 5\' 7"  (1.702 m), weight 89.35 kg (196 lb 15.7 oz), SpO2 95.00%.  Physical Exam:  General:  No acute distress, awake Extremities: extremities normal, atraumatic, no cyanosis or edema Genitourinary:  Foley: pink urine. No clots.  Foley: 3-way. No clots    I/O last 3 completed shifts: In: 16109 [P.O.:720; I.V.:3310; Other:24700] Out: 60454 [Urine:29925]  Recent Labs  Basename 02/06/12 0333 02/05/12 0806   HGB 11.4* 14.6   WBC -- --   PLT -- --    Recent Labs  [facility] 02/05/12 0806   NA 143   K 3.8   CL 105   CO2 --   BUN 12   CREATININE 0.90   CALCIUM --   GFRNONAA --   GFRAA --     No results found for this basename: PT:2,INR:2,APTT:2 in the last 72 hours   No components found with this basename: ABG:2  Assessment/Plan:  Catheter not removed. Remove in AM. D/C in AM. Teach how to use s-p tube.   Follow up per appointment.

## 2012-02-07 NOTE — Progress Notes (Signed)
2 Days Post-Op Subjective: Patient reports no complaint.  Objective: Vital signs in last 24 hours: Temp:  [98.1 F (36.7 C)-98.5 F (36.9 C)] 98.5 F (36.9 C) (10/05 0523) Pulse Rate:  [58-72] 72  (10/05 0523) Resp:  [16] 16  (10/05 0523) BP: (125-132)/(64-70) 125/70 mmHg (10/05 0523) SpO2:  [96 %-97 %] 97 % (10/05 0523)  Intake/Output from previous day: 10/04 0701 - 10/05 0700 In: 4940 [P.O.:840; I.V.:1100] Out: 8225 [Urine:8225] Intake/Output this shift:    Physical Exam:  General: His suprapubic tube site looks good. The dressing is intact. His Foley catheter is draining. Urine is slightly blood-tinged but there are no clots. His abdomen is soft and nontender.  Lab Results:  Basename 02/06/12 0333 02/05/12 0806  HGB 11.4* 14.6  HCT 32.0* 43.0   BMET  Basename 02/05/12 0806  NA 143  K 3.8  CL 105  CO2 --  GLUCOSE 112*  BUN 12  CREATININE 0.90  CALCIUM --   No results found for this basename: LABPT:3,INR:3 in the last 72 hours No results found for this basename: LABURIN:1 in the last 72 hours No results found for this or any previous visit.  Studies/Results: No results found.  Assessment/Plan: He is doing well and ready for discharge. I will remove his urethral Foley catheter and he will be discharged with his suprapubic tube open to drainage.  DC urethral Foley  Discharged home with suprapubic tube.   LOS: 2 days   Philip Allen C 02/07/2012, 10:15 AM

## 2012-02-07 NOTE — Progress Notes (Signed)
Paged oncall Md regarding order to remove foley catheter.

## 2012-02-07 NOTE — Progress Notes (Signed)
Connected supra public to bag which is draining.  Removed foley catheter.  Provided teaching on S-P care.

## 2012-02-16 DIAGNOSIS — N401 Enlarged prostate with lower urinary tract symptoms: Secondary | ICD-10-CM | POA: Diagnosis not present

## 2012-03-02 DIAGNOSIS — R82998 Other abnormal findings in urine: Secondary | ICD-10-CM | POA: Diagnosis not present

## 2012-03-02 DIAGNOSIS — R339 Retention of urine, unspecified: Secondary | ICD-10-CM | POA: Diagnosis not present

## 2012-03-16 DIAGNOSIS — N3941 Urge incontinence: Secondary | ICD-10-CM | POA: Diagnosis not present

## 2012-04-11 ENCOUNTER — Emergency Department (INDEPENDENT_AMBULATORY_CARE_PROVIDER_SITE_OTHER)
Admission: EM | Admit: 2012-04-11 | Discharge: 2012-04-11 | Disposition: A | Discharge status: Home / Self Care | Payer: Medicare Other | Source: Home / Self Care | Attending: Family Medicine | Admitting: Family Medicine

## 2012-04-11 ENCOUNTER — Emergency Department (INDEPENDENT_AMBULATORY_CARE_PROVIDER_SITE_OTHER): Payer: Medicare Other

## 2012-04-11 ENCOUNTER — Encounter (HOSPITAL_COMMUNITY): Payer: Self-pay | Admitting: *Deleted

## 2012-04-11 DIAGNOSIS — S63509A Unspecified sprain of unspecified wrist, initial encounter: Secondary | ICD-10-CM | POA: Diagnosis not present

## 2012-04-11 DIAGNOSIS — M25539 Pain in unspecified wrist: Secondary | ICD-10-CM | POA: Diagnosis not present

## 2012-04-11 DIAGNOSIS — S63501A Unspecified sprain of right wrist, initial encounter: Secondary | ICD-10-CM

## 2012-04-11 NOTE — ED Provider Notes (Signed)
History     CSN: [id number]  Arrival date & time 04/11/12  1304   First MD Initiated Contact with Patient 04/11/12 1322      Chief Complaint  Patient presents with  . Wrist Injury    (Consider location/radiation/quality/duration/timing/severity/associated sxs/prior treatment) Patient is a 71 y.o. male presenting with wrist injury. The history is provided by the patient.  Wrist Injury  The incident occurred yesterday. The incident occurred at home. The injury mechanism was torsion (reached back and pushed up on sofa and felt pop in ulnar right wrist, similar action this am caused similar pain, here for eval.). The pain is present in the right wrist. The quality of the pain is described as aching. The pain is mild. Pertinent negatives include no fever. The symptoms are aggravated by movement, palpation and use.    Past Medical History  Diagnosis Date  . Hypertension   . BPH (benign prostatic hypertrophy)   . Frequency of urination   . Urgency of urination   . Nocturia   . Hyperlipidemia   . GERD (gastroesophageal reflux disease)   . 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 Right shoulder surgery 1998  . Transurethral resection of prostate 02/05/2012    Procedure: TRANSURETHRAL RESECTION OF THE PROSTATE WITH GYRUS INSTRUMENTS;  Surgeon: Kathi Ludwig, MD;  Location: [facility];  Service: Urology;  Laterality: N/A;  WITH GYRUS SUPRAPUBIC TUBE OWER TO MAIN    No family history on file.  History  Substance Use Topics  . Smoking status: Never Smoker   . Smokeless tobacco: Never Used  . Alcohol Use: Yes     Comment: OCCASIONAL       Review of Systems  Constitutional: Negative for fever.  Musculoskeletal: Negative for joint swelling.    Allergies  Review of patient's allergies indicates no known allergies.  Home Medications   Current Outpatient Rx  Name  Route  Sig  Dispense  Refill    . AMLODIPINE BESYLATE 5 MG PO TABS   Oral   Take 5 mg by mouth every morning.         . ASPIRIN 81 MG PO TABS   Oral   Take 81 mg by mouth every morning.         . CYCLOBENZAPRINE HCL 10 MG PO TABS   Oral   Take 10 mg by mouth 3 (three) times daily as needed.         [name] ESOMEPRAZOLE MAGNESIUM 40 MG PO CPDR   Oral   Take 40 mg by mouth daily before breakfast.         . SILODOSIN 4 MG PO CAPS   Oral   Take 8 mg by mouth daily with breakfast.         . SIMVASTATIN 40 MG PO TABS   Oral   Take 40 mg by mouth every evening.         [name] TEMAZEPAM 15 MG PO CAPS   Oral   Take 15 mg by mouth at bedtime.         [name] CIPROFLOXACIN HCL 500 MG PO TABS   Oral   Take 1 tablet (500 mg total) by mouth 2 (two) times daily.   10 tablet   0   . HYDROCODONE-ACETAMINOPHEN 7.5-650 MG PO TABS   Oral   Take 1 tablet by mouth every 6 (six) hours as needed  for pain.   30 tablet   0     BP 113/66  Pulse 68  Temp 97.9 F (36.6 C) (Oral)  Resp 17  SpO2 98%  Physical Exam  Nursing note and vitals reviewed. Constitutional: He is oriented to person, place, and time. He appears well-developed and well-nourished.  Musculoskeletal: He exhibits tenderness.       Right wrist: He exhibits decreased range of motion and tenderness. He exhibits no swelling and no effusion.       Arms: Neurological: He is alert and oriented to person, place, and time.  Skin: Skin is warm and dry.    ED Course  Procedures (including critical care time)  Labs Reviewed - No data to display Dg Wrist Complete Right  04/11/2012  *RADIOLOGY REPORT*  Clinical Data: Pain  RIGHT WRIST - COMPLETE 3+ VIEW  Comparison: None.  Findings: Four views of the right wrist submitted.  No acute fracture or subluxation.  No radiopaque foreign body.  IMPRESSION: No acute fracture or subluxation.   Original Report Authenticated By: Natasha Mead, M.D.      1. Sprain of right wrist       MDM  X-rays reviewed and report  per radiologist.        Linna Hoff, MD 04/11/12 [phone]

## 2012-04-11 NOTE — ED Notes (Signed)
Reports feeling a "snap" in right wrist when using BUE to push himself up off the sofa yesterday; incident occurred twice.  C/O continued pain and tenderness to right anterior medial wrist.  Has been wearing flexible wrist support.

## 2012-05-17 ENCOUNTER — Emergency Department (HOSPITAL_COMMUNITY)
Admission: EM | Admit: 2012-05-17 | Discharge: 2012-05-17 | Disposition: A | Discharge status: Home / Self Care | Payer: Medicare Other | Attending: Emergency Medicine | Admitting: Emergency Medicine

## 2012-05-17 ENCOUNTER — Encounter (HOSPITAL_COMMUNITY): Payer: Self-pay

## 2012-05-17 DIAGNOSIS — M79609 Pain in unspecified limb: Secondary | ICD-10-CM | POA: Diagnosis not present

## 2012-05-17 DIAGNOSIS — I10 Essential (primary) hypertension: Secondary | ICD-10-CM | POA: Insufficient documentation

## 2012-05-17 DIAGNOSIS — IMO0001 Reserved for inherently not codable concepts without codable children: Secondary | ICD-10-CM | POA: Diagnosis not present

## 2012-05-17 DIAGNOSIS — Z7982 Long term (current) use of aspirin: Secondary | ICD-10-CM | POA: Insufficient documentation

## 2012-05-17 DIAGNOSIS — T148XXA Other injury of unspecified body region, initial encounter: Secondary | ICD-10-CM

## 2012-05-17 DIAGNOSIS — Z79899 Other long term (current) drug therapy: Secondary | ICD-10-CM | POA: Diagnosis not present

## 2012-05-17 DIAGNOSIS — Z87448 Personal history of other diseases of urinary system: Secondary | ICD-10-CM | POA: Diagnosis not present

## 2012-05-17 DIAGNOSIS — Y9389 Activity, other specified: Secondary | ICD-10-CM | POA: Insufficient documentation

## 2012-05-17 DIAGNOSIS — E785 Hyperlipidemia, unspecified: Secondary | ICD-10-CM | POA: Insufficient documentation

## 2012-05-17 DIAGNOSIS — Z8719 Personal history of other diseases of the digestive system: Secondary | ICD-10-CM | POA: Insufficient documentation

## 2012-05-17 DIAGNOSIS — Y9289 Other specified places as the place of occurrence of the external cause: Secondary | ICD-10-CM | POA: Insufficient documentation

## 2012-05-17 DIAGNOSIS — W050XXA Fall from non-moving wheelchair, initial encounter: Secondary | ICD-10-CM | POA: Insufficient documentation

## 2012-05-17 DIAGNOSIS — R6889 Other general symptoms and signs: Secondary | ICD-10-CM | POA: Diagnosis not present

## 2012-05-17 DIAGNOSIS — M25559 Pain in unspecified hip: Secondary | ICD-10-CM | POA: Diagnosis not present

## 2012-05-17 LAB — POCT I-STAT, CHEM 8
BUN: 17 mg/dL (ref 6–23)
Calcium, Ion: 1.17 mmol/L (ref 1.13–1.30)
Chloride: 107 meq/L (ref 96–112)
Creatinine, Ser: 1 mg/dL (ref 0.50–1.35)
Glucose, Bld: 114 mg/dL — ABNORMAL HIGH (ref 70–99)
HCT: 44 % (ref 39.0–52.0)
Hemoglobin: 15 g/dL (ref 13.0–17.0)
Potassium: 4.2 meq/L (ref 3.5–5.1)
Sodium: 140 meq/L (ref 135–145)
TCO2: 25 mmol/L (ref 0–100)

## 2012-05-17 MED ORDER — DIAZEPAM 5 MG PO TABS: 5.0000 mg | ORAL_TABLET | Freq: Four times a day (QID) | ORAL | Status: DC | PRN
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
## 2012-05-18 NOTE — ED Provider Notes (Signed)
Medical screening examination/treatment/procedure(s) were performed by non-physician practitioner and as supervising physician I was immediately available for consultation/collaboration.  Pami Wool, MD 05/18/12 2117 

## 2012-07-21 DIAGNOSIS — N3941 Urge incontinence: Secondary | ICD-10-CM | POA: Diagnosis not present

## 2012-07-21 DIAGNOSIS — N401 Enlarged prostate with lower urinary tract symptoms: Secondary | ICD-10-CM | POA: Diagnosis not present

## 2012-07-28 DIAGNOSIS — E785 Hyperlipidemia, unspecified: Secondary | ICD-10-CM | POA: Diagnosis not present

## 2012-07-28 DIAGNOSIS — R252 Cramp and spasm: Secondary | ICD-10-CM | POA: Diagnosis not present

## 2012-07-28 DIAGNOSIS — M25569 Pain in unspecified knee: Secondary | ICD-10-CM | POA: Diagnosis not present

## 2012-07-28 DIAGNOSIS — Z1331 Encounter for screening for depression: Secondary | ICD-10-CM | POA: Diagnosis not present

## 2012-07-28 DIAGNOSIS — K219 Gastro-esophageal reflux disease without esophagitis: Secondary | ICD-10-CM | POA: Diagnosis not present

## 2012-07-28 DIAGNOSIS — N401 Enlarged prostate with lower urinary tract symptoms: Secondary | ICD-10-CM | POA: Diagnosis not present

## 2012-07-28 DIAGNOSIS — N138 Other obstructive and reflux uropathy: Secondary | ICD-10-CM | POA: Diagnosis not present

## 2012-07-28 DIAGNOSIS — I10 Essential (primary) hypertension: Secondary | ICD-10-CM | POA: Diagnosis not present

## 2012-11-30 DIAGNOSIS — N401 Enlarged prostate with lower urinary tract symptoms: Secondary | ICD-10-CM | POA: Diagnosis not present

## 2013-01-17 DIAGNOSIS — N401 Enlarged prostate with lower urinary tract symptoms: Secondary | ICD-10-CM | POA: Diagnosis not present

## 2013-01-17 DIAGNOSIS — B356 Tinea cruris: Secondary | ICD-10-CM | POA: Diagnosis not present

## 2013-01-17 DIAGNOSIS — I10 Essential (primary) hypertension: Secondary | ICD-10-CM | POA: Diagnosis not present

## 2013-01-17 DIAGNOSIS — M25569 Pain in unspecified knee: Secondary | ICD-10-CM | POA: Diagnosis not present

## 2013-01-17 DIAGNOSIS — K219 Gastro-esophageal reflux disease without esophagitis: Secondary | ICD-10-CM | POA: Diagnosis not present

## 2013-01-17 DIAGNOSIS — Z1331 Encounter for screening for depression: Secondary | ICD-10-CM | POA: Diagnosis not present

## 2013-01-17 DIAGNOSIS — N138 Other obstructive and reflux uropathy: Secondary | ICD-10-CM | POA: Diagnosis not present

## 2013-01-17 DIAGNOSIS — Z23 Encounter for immunization: Secondary | ICD-10-CM | POA: Diagnosis not present

## 2013-01-17 DIAGNOSIS — E785 Hyperlipidemia, unspecified: Secondary | ICD-10-CM | POA: Diagnosis not present

## 2013-05-25 IMAGING — CR DG WRIST COMPLETE 3+V*R*
2 series · 2 of 2 positions shown · non-contrast
Comparison: None.

CLINICAL DATA: Pain

RIGHT WRIST - COMPLETE 3+ VIEW

[view not recorded (1 of 2)]
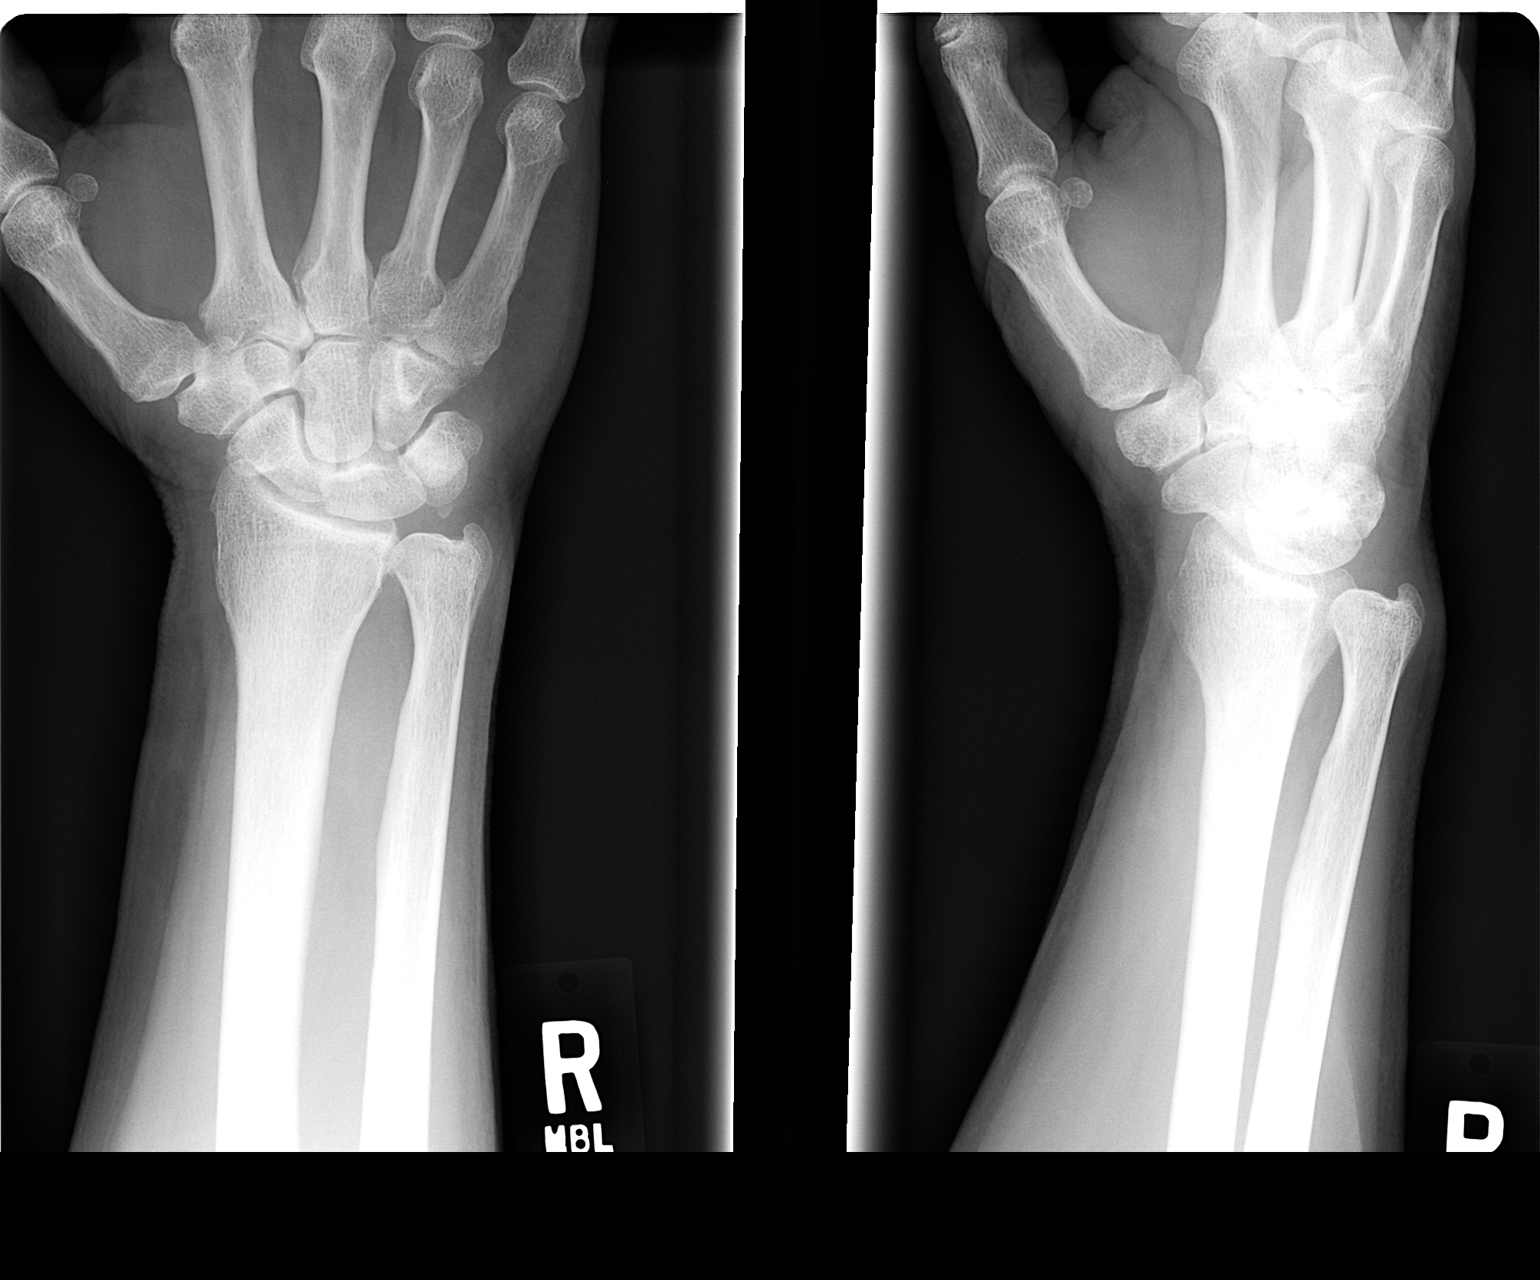

[view not recorded (2 of 2)]
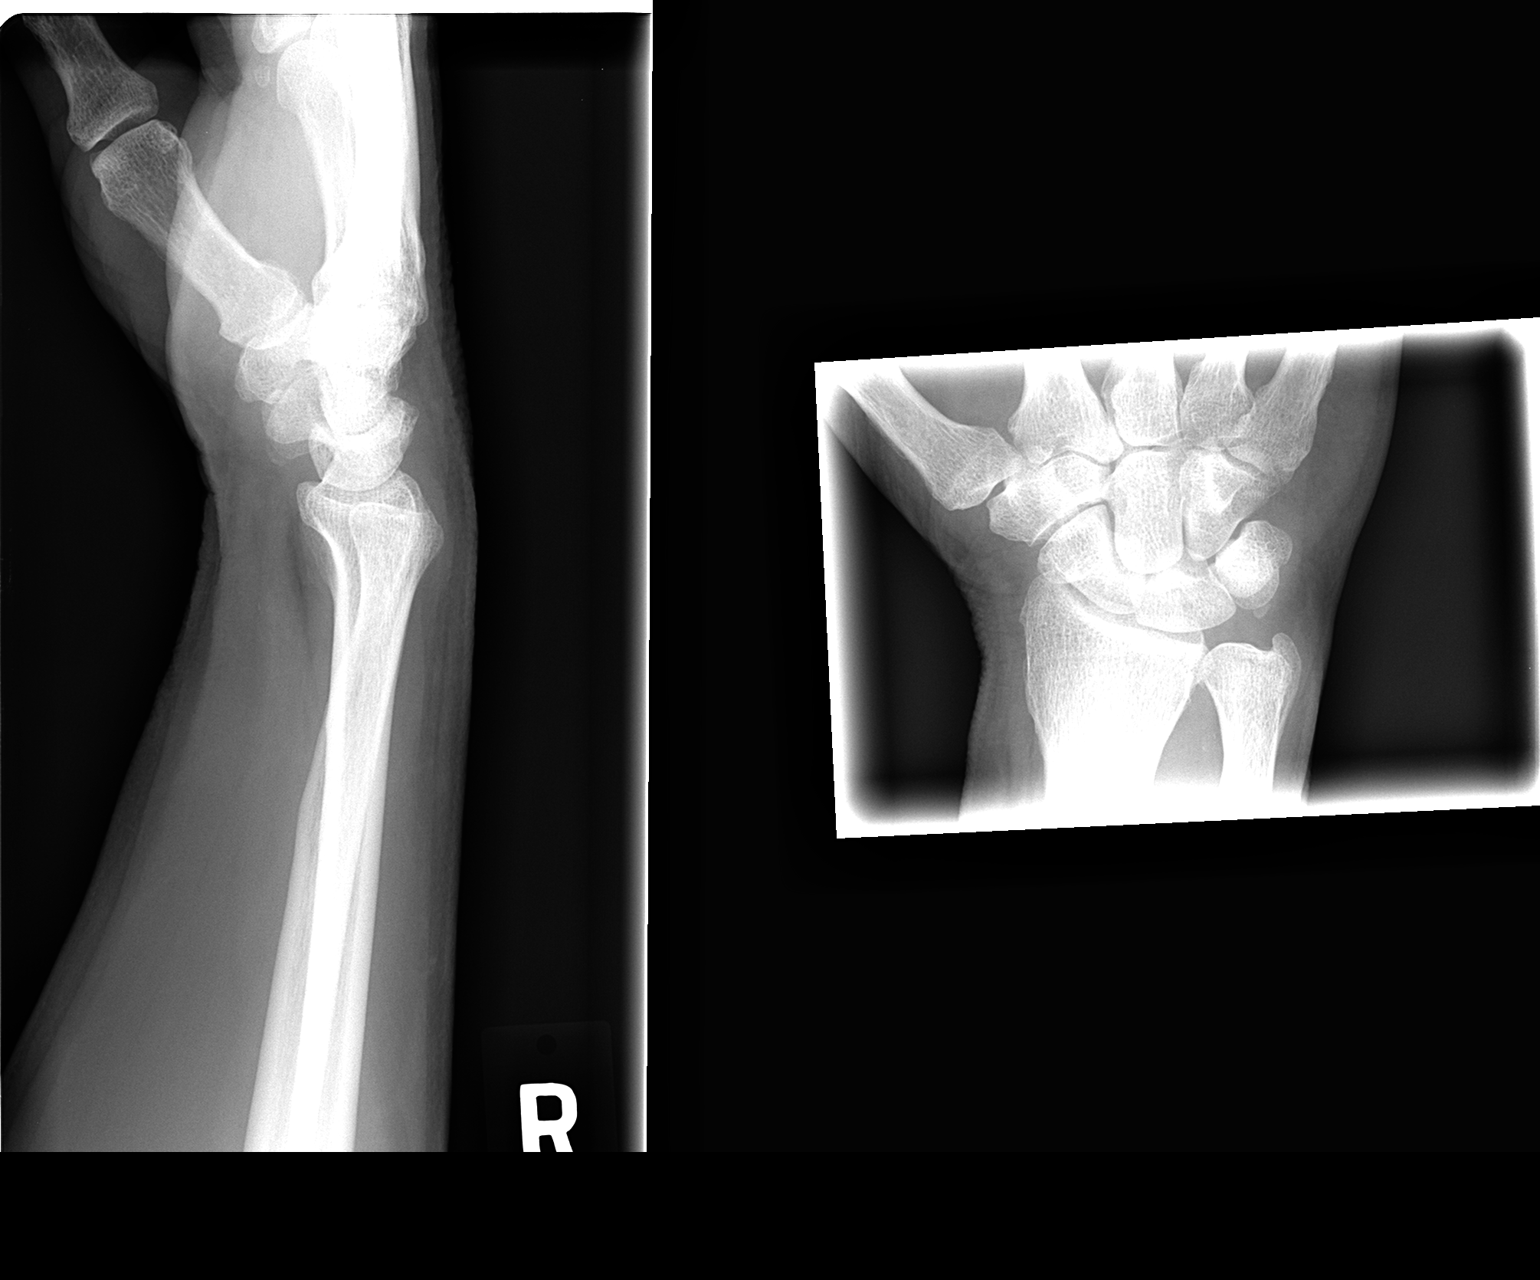

[2 of 2 positions shown; findings below may reference images not displayed]

FINDINGS: Four views of the right wrist submitted.  No acute
fracture or subluxation.  No radiopaque foreign body.
IMPRESSION: No acute fracture or subluxation.

## 2013-07-11 DIAGNOSIS — E785 Hyperlipidemia, unspecified: Secondary | ICD-10-CM | POA: Diagnosis not present

## 2013-07-11 DIAGNOSIS — I10 Essential (primary) hypertension: Secondary | ICD-10-CM | POA: Diagnosis not present

## 2013-07-11 DIAGNOSIS — K219 Gastro-esophageal reflux disease without esophagitis: Secondary | ICD-10-CM | POA: Diagnosis not present

## 2013-07-11 DIAGNOSIS — L989 Disorder of the skin and subcutaneous tissue, unspecified: Secondary | ICD-10-CM | POA: Diagnosis not present

## 2013-07-11 DIAGNOSIS — Z6830 Body mass index (BMI) 30.0-30.9, adult: Secondary | ICD-10-CM | POA: Diagnosis not present

## 2013-07-11 DIAGNOSIS — N401 Enlarged prostate with lower urinary tract symptoms: Secondary | ICD-10-CM | POA: Diagnosis not present

## 2013-07-11 DIAGNOSIS — N138 Other obstructive and reflux uropathy: Secondary | ICD-10-CM | POA: Diagnosis not present

## 2013-07-11 DIAGNOSIS — M722 Plantar fascial fibromatosis: Secondary | ICD-10-CM | POA: Diagnosis not present

## 2013-07-14 DIAGNOSIS — L820 Inflamed seborrheic keratosis: Secondary | ICD-10-CM | POA: Diagnosis not present

## 2013-07-14 DIAGNOSIS — D235 Other benign neoplasm of skin of trunk: Secondary | ICD-10-CM | POA: Diagnosis not present

## 2013-07-14 DIAGNOSIS — L570 Actinic keratosis: Secondary | ICD-10-CM | POA: Diagnosis not present

## 2013-11-11 DIAGNOSIS — H5210 Myopia, unspecified eye: Secondary | ICD-10-CM | POA: Diagnosis not present

## 2013-11-11 DIAGNOSIS — H2510 Age-related nuclear cataract, unspecified eye: Secondary | ICD-10-CM | POA: Diagnosis not present

## 2013-11-11 DIAGNOSIS — H35369 Drusen (degenerative) of macula, unspecified eye: Secondary | ICD-10-CM | POA: Diagnosis not present

## 2013-11-11 DIAGNOSIS — D3130 Benign neoplasm of unspecified choroid: Secondary | ICD-10-CM | POA: Diagnosis not present

## 2014-03-23 DIAGNOSIS — Z23 Encounter for immunization: Secondary | ICD-10-CM | POA: Diagnosis not present

## 2014-05-26 DIAGNOSIS — J069 Acute upper respiratory infection, unspecified: Secondary | ICD-10-CM | POA: Diagnosis not present

## 2014-05-26 DIAGNOSIS — J40 Bronchitis, not specified as acute or chronic: Secondary | ICD-10-CM | POA: Diagnosis not present

## 2014-05-26 DIAGNOSIS — J0100 Acute maxillary sinusitis, unspecified: Secondary | ICD-10-CM | POA: Diagnosis not present

## 2014-09-25 DIAGNOSIS — Z125 Encounter for screening for malignant neoplasm of prostate: Secondary | ICD-10-CM | POA: Diagnosis not present

## 2014-09-25 DIAGNOSIS — E782 Mixed hyperlipidemia: Secondary | ICD-10-CM | POA: Diagnosis not present

## 2014-09-25 DIAGNOSIS — I10 Essential (primary) hypertension: Secondary | ICD-10-CM | POA: Diagnosis not present

## 2014-09-25 DIAGNOSIS — Z79899 Other long term (current) drug therapy: Secondary | ICD-10-CM | POA: Diagnosis not present

## 2014-09-25 DIAGNOSIS — E785 Hyperlipidemia, unspecified: Secondary | ICD-10-CM | POA: Diagnosis not present

## 2014-09-25 DIAGNOSIS — N401 Enlarged prostate with lower urinary tract symptoms: Secondary | ICD-10-CM | POA: Diagnosis not present

## 2014-09-25 DIAGNOSIS — E559 Vitamin D deficiency, unspecified: Secondary | ICD-10-CM | POA: Diagnosis not present

## 2014-09-25 DIAGNOSIS — K219 Gastro-esophageal reflux disease without esophagitis: Secondary | ICD-10-CM | POA: Diagnosis not present

## 2014-09-25 DIAGNOSIS — R251 Tremor, unspecified: Secondary | ICD-10-CM | POA: Diagnosis not present

## 2014-09-25 DIAGNOSIS — G250 Essential tremor: Secondary | ICD-10-CM | POA: Diagnosis not present

## 2014-10-18 DIAGNOSIS — M069 Rheumatoid arthritis, unspecified: Secondary | ICD-10-CM | POA: Diagnosis not present

## 2014-10-18 DIAGNOSIS — E782 Mixed hyperlipidemia: Secondary | ICD-10-CM | POA: Diagnosis not present

## 2014-10-18 DIAGNOSIS — I10 Essential (primary) hypertension: Secondary | ICD-10-CM | POA: Diagnosis not present

## 2014-10-18 DIAGNOSIS — R7301 Impaired fasting glucose: Secondary | ICD-10-CM | POA: Diagnosis not present

## 2014-12-13 DIAGNOSIS — H35363 Drusen (degenerative) of macula, bilateral: Secondary | ICD-10-CM | POA: Diagnosis not present

## 2014-12-13 DIAGNOSIS — H02831 Dermatochalasis of right upper eyelid: Secondary | ICD-10-CM | POA: Diagnosis not present

## 2014-12-13 DIAGNOSIS — H02832 Dermatochalasis of right lower eyelid: Secondary | ICD-10-CM | POA: Diagnosis not present

## 2014-12-13 DIAGNOSIS — H02835 Dermatochalasis of left lower eyelid: Secondary | ICD-10-CM | POA: Diagnosis not present

## 2014-12-13 DIAGNOSIS — D4981 Neoplasm of unspecified behavior of retina and choroid: Secondary | ICD-10-CM | POA: Diagnosis not present

## 2014-12-13 DIAGNOSIS — H02834 Dermatochalasis of left upper eyelid: Secondary | ICD-10-CM | POA: Diagnosis not present

## 2014-12-13 DIAGNOSIS — H52223 Regular astigmatism, bilateral: Secondary | ICD-10-CM | POA: Diagnosis not present

## 2014-12-13 DIAGNOSIS — H2513 Age-related nuclear cataract, bilateral: Secondary | ICD-10-CM | POA: Diagnosis not present

## 2014-12-13 DIAGNOSIS — H524 Presbyopia: Secondary | ICD-10-CM | POA: Diagnosis not present

## 2015-01-03 DIAGNOSIS — S92325A Nondisplaced fracture of second metatarsal bone, left foot, initial encounter for closed fracture: Secondary | ICD-10-CM | POA: Diagnosis not present

## 2015-01-03 DIAGNOSIS — M79674 Pain in right toe(s): Secondary | ICD-10-CM | POA: Diagnosis not present

## 2015-01-03 DIAGNOSIS — M79672 Pain in left foot: Secondary | ICD-10-CM | POA: Diagnosis not present

## 2015-01-15 DIAGNOSIS — H25013 Cortical age-related cataract, bilateral: Secondary | ICD-10-CM | POA: Diagnosis not present

## 2015-01-29 DIAGNOSIS — H2512 Age-related nuclear cataract, left eye: Secondary | ICD-10-CM | POA: Diagnosis not present

## 2015-01-31 DIAGNOSIS — I10 Essential (primary) hypertension: Secondary | ICD-10-CM | POA: Diagnosis not present

## 2015-01-31 DIAGNOSIS — R7301 Impaired fasting glucose: Secondary | ICD-10-CM | POA: Diagnosis not present

## 2015-01-31 DIAGNOSIS — E782 Mixed hyperlipidemia: Secondary | ICD-10-CM | POA: Diagnosis not present

## 2015-02-07 DIAGNOSIS — H2512 Age-related nuclear cataract, left eye: Secondary | ICD-10-CM | POA: Diagnosis not present

## 2015-02-07 DIAGNOSIS — H25812 Combined forms of age-related cataract, left eye: Secondary | ICD-10-CM | POA: Diagnosis not present

## 2015-02-13 DIAGNOSIS — H2511 Age-related nuclear cataract, right eye: Secondary | ICD-10-CM | POA: Diagnosis not present

## 2015-02-21 DIAGNOSIS — H2511 Age-related nuclear cataract, right eye: Secondary | ICD-10-CM | POA: Diagnosis not present

## 2015-02-21 DIAGNOSIS — H25811 Combined forms of age-related cataract, right eye: Secondary | ICD-10-CM | POA: Diagnosis not present

## 2015-03-14 ENCOUNTER — Encounter (HOSPITAL_COMMUNITY): Payer: Self-pay | Admitting: Emergency Medicine

## 2015-03-14 ENCOUNTER — Emergency Department (HOSPITAL_COMMUNITY)
Admission: EM | Admit: 2015-03-14 | Discharge: 2015-03-14 | Disposition: A | Discharge status: Home / Self Care | Payer: Medicare Other | Attending: Emergency Medicine | Admitting: Emergency Medicine

## 2015-03-14 ENCOUNTER — Emergency Department (HOSPITAL_COMMUNITY): Payer: Medicare Other

## 2015-03-14 DIAGNOSIS — I10 Essential (primary) hypertension: Secondary | ICD-10-CM | POA: Diagnosis not present

## 2015-03-14 DIAGNOSIS — Z79899 Other long term (current) drug therapy: Secondary | ICD-10-CM | POA: Diagnosis not present

## 2015-03-14 DIAGNOSIS — M79602 Pain in left arm: Secondary | ICD-10-CM | POA: Insufficient documentation

## 2015-03-14 DIAGNOSIS — K219 Gastro-esophageal reflux disease without esophagitis: Secondary | ICD-10-CM | POA: Diagnosis not present

## 2015-03-14 DIAGNOSIS — R079 Chest pain, unspecified: Secondary | ICD-10-CM | POA: Insufficient documentation

## 2015-03-14 DIAGNOSIS — E785 Hyperlipidemia, unspecified: Secondary | ICD-10-CM | POA: Insufficient documentation

## 2015-03-14 DIAGNOSIS — Z87438 Personal history of other diseases of male genital organs: Secondary | ICD-10-CM | POA: Insufficient documentation

## 2015-03-14 DIAGNOSIS — Z791 Long term (current) use of non-steroidal anti-inflammatories (NSAID): Secondary | ICD-10-CM | POA: Diagnosis not present

## 2015-03-14 DIAGNOSIS — R0789 Other chest pain: Secondary | ICD-10-CM | POA: Diagnosis not present

## 2015-03-14 DIAGNOSIS — Z7982 Long term (current) use of aspirin: Secondary | ICD-10-CM | POA: Insufficient documentation

## 2015-03-14 DIAGNOSIS — Z8619 Personal history of other infectious and parasitic diseases: Secondary | ICD-10-CM | POA: Diagnosis not present

## 2015-03-14 DIAGNOSIS — Z792 Long term (current) use of antibiotics: Secondary | ICD-10-CM | POA: Diagnosis not present

## 2015-03-14 DIAGNOSIS — H47012 Ischemic optic neuropathy, left eye: Secondary | ICD-10-CM | POA: Diagnosis not present

## 2015-03-14 LAB — SEDIMENTATION RATE: Sed Rate: 6 mm/hr (ref 0–16)

## 2015-03-14 LAB — BASIC METABOLIC PANEL
Anion gap: 8 (ref 5–15)
BUN: 16 mg/dL (ref 6–20)
CO2: 24 mmol/L (ref 22–32)
Calcium: 9.5 mg/dL (ref 8.9–10.3)
Chloride: 107 mmol/L (ref 101–111)
Creatinine, Ser: 0.94 mg/dL (ref 0.61–1.24)
GFR calc Af Amer: >60 mL/min (ref >60)
GFR calc non Af Amer: >60 mL/min (ref >60)
Glucose, Bld: 138 mg/dL — ABNORMAL HIGH (ref 65–99)
Potassium: 4.1 mmol/L (ref 3.5–5.1)
Sodium: 139 mmol/L (ref 135–145)

## 2015-03-14 LAB — CBC
HCT: 41.6 % (ref 39.0–52.0)
Hemoglobin: 14.4 g/dL (ref 13.0–17.0)
MCH: 32.5 pg (ref 26.0–34.0)
MCHC: 34.6 g/dL (ref 30.0–36.0)
MCV: 93.9 fL (ref 78.0–100.0)
Platelets: 121 10*3/uL — ABNORMAL LOW (ref 150–400)
RBC: 4.43 MIL/uL (ref 4.22–5.81)
RDW: 12.6 % (ref 11.5–15.5)
WBC: 5.4 10*3/uL (ref 4.0–10.5)

## 2015-03-14 LAB — DIFFERENTIAL
Basophils Absolute: 0 10*3/uL (ref 0.0–0.1)
Basophils Relative: 0 %
Eosinophils Absolute: 0.2 10*3/uL (ref 0.0–0.7)
Eosinophils Relative: 3 %
Lymphocytes Relative: 35 %
Lymphs Abs: 1.9 10*3/uL (ref 0.7–4.0)
Monocytes Absolute: 0.4 10*3/uL (ref 0.1–1.0)
Monocytes Relative: 8 %
Neutro Abs: 3 10*3/uL (ref 1.7–7.7)
Neutrophils Relative %: 54 %

## 2015-03-14 LAB — C-REACTIVE PROTEIN: CRP: <0.5 mg/dL (ref <1.0)

## 2015-03-14 NOTE — ED Notes (Signed)
The patient was having chest pain yesterday and now today he is having arm pain today.  The patient's wife has a prescription for labs to be drawn from Dr. Kevan Rosebush.   The patient is not having chest pain now, but he is having arm pain now.  The patient is having surgery in his eye tomorrow.  The wife is concerned something is happening with his heart.

## 2015-03-14 NOTE — ED Provider Notes (Signed)
CSN: [id number]     Arrival date & time 03/14/15  1811 History   First MD Initiated Contact with Patient 03/14/15 1855     Chief Complaint  Patient presents with  . Chest Pain    The patient was having chest pain yesterday and now today he is having arm pain today.  The patient's wife has a prescription for labs to be drawn from Dr. Kevan Rosebush.       HPI Patient reports yesterday he was working in the garage and he fell slightly forward onto his left chest and shoulder and developed a burning sensation in his chest.  This lasts approximately 30 minutes.  He had no associated diaphoresis, nausea, shortness of breath.  He went about the remainder of his stay and felt fine.  Today he was sitting on the couch and developed left arm pain without associated chest pain that lasted approximately 20 minutes.  He felt it like an aching pain.  He denies weakness of his arms or legs.  Again no associated shortness of breath, nausea, diaphoresis.  Last night the patient developed some slight blurred vision in his left eye and he was seen by ophthalmology today because of decreasing vision out of his left eye.  He is being seen by the retina specialist tomorrow and was ordered to have a CBC, sedimentation rate, CRP.  They asked him about chest pain and when the patient, and that is having chest discomfort but the physician and the patient's wife recommended he come to the ER to have it evaluated.  Currently he is without any symptoms.  He reports no chest discomfort or arm pain except for the 2 isolated episodes.  At age 74 he is very functional.   Past Medical History  Diagnosis Date  . Hypertension   . BPH (benign prostatic hypertrophy)   . Frequency of urination   . Urgency of urination   . Nocturia   . Hyperlipidemia   . GERD (gastroesophageal reflux disease)   . 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 Right shoulder surgery  1998   . Transurethral resection of prostate  02/05/2012    Procedure: TRANSURETHRAL RESECTION OF THE PROSTATE WITH GYRUS INSTRUMENTS;  Surgeon: Ailene Rud, MD;  Location: [facility];  Service: Urology;  Laterality: N/A;  WITH GYRUS SUPRAPUBIC TUBE OWER TO MAIN   History reviewed. No pertinent family history. Social History  Substance Use Topics  . Smoking status: Never Smoker   . Smokeless tobacco: Never Used  . Alcohol Use: Yes     Comment: OCCASIONAL     Review of Systems  All other systems reviewed and are negative.     Allergies  Naproxen  Home Medications   Prior to Admission medications   Medication Sig Start Date End Date Taking? Authorizing Provider  amLODipine (NORVASC) 5 MG tablet Take 5 mg by mouth every morning.   Yes Historical Provider, MD  aspirin 81 MG tablet Take 81 mg by mouth every morning.   Yes Historical Provider, MD  atorvastatin (LIPITOR) 40 MG tablet Take 40 mg by mouth daily.   Yes Historical Provider, MD  celecoxib (CELEBREX) 200 MG capsule Take 400 mg by mouth at bedtime.   Yes Historical Provider, MD  esomeprazole (NEXIUM) 40 MG capsule Take 40 mg by mouth daily before breakfast.   Yes Historical Provider, MD  temazepam (RESTORIL) 15 MG capsule  Take 30 mg by mouth at bedtime as needed.    Yes Historical Provider, MD  ciprofloxacin (CIPRO) 500 MG tablet Take 500 mg by mouth 2 (two) times daily. 02/06/12   Carolan Clines, MD  diazepam (VALIUM) 5 MG tablet Take 1 tablet (5 mg total) by mouth every 6 (six) hours as needed for anxiety (spasms). 05/17/12   Nicole Pisciotta, PA-C  hydrocodone-acetaminophen (LORCET PLUS) 7.5-650 MG per tablet Take 1 tablet by mouth every 6 (six) hours as needed for pain. 02/06/12   Carolan Clines, MD   BP 143/83 mmHg  Pulse 56  Temp(Src) 98.3 F (36.8 C) (Oral)  Resp 16  SpO2 97% Physical Exam  Constitutional: He is oriented to person, place, and time. He appears well-developed and  well-nourished.  HENT:  Head: Normocephalic and atraumatic.  Eyes: EOM are normal.  Neck: Normal range of motion.  Cardiovascular: Normal rate, regular rhythm, normal heart sounds and intact distal pulses.   Pulmonary/Chest: Effort normal and breath sounds normal. No respiratory distress.  Abdominal: Soft. He exhibits no distension. There is no tenderness.  Musculoskeletal: Normal range of motion.  Neurological: He is alert and oriented to person, place, and time.  Skin: Skin is warm and dry.  Psychiatric: He has a normal mood and affect. Judgment normal.  Nursing note and vitals reviewed.   ED Course  Procedures (including critical care time) Labs Review Labs Reviewed  BASIC METABOLIC PANEL - Abnormal; Notable for the following:    Glucose, Bld 138 (*)    All other components within normal limits  CBC - Abnormal; Notable for the following:    Platelets 121 (*)    All other components within normal limits  SEDIMENTATION RATE  C-REACTIVE PROTEIN  DIFFERENTIAL  I-STAT TROPOININ, ED  Randolm Idol, ED    Imaging Review Dg Chest 2 View  03/14/2015  CLINICAL DATA:  74 year old male with midsternal chest pain yesterday and left arm tingling today for 30 minutes. EXAM: CHEST  2 VIEW COMPARISON:  Chest x-ray 05/26/2014. FINDINGS: Mild diffuse peribronchial cuffing. Lung volumes are normal. No consolidative airspace disease. No pleural effusions. No pneumothorax. No pulmonary nodule or mass noted. Pulmonary vasculature and the cardiomediastinal silhouette are within normal limits. 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 IMPRESSION: 1. Mild diffuse peribronchial cuffing, suggestive of acute bronchitis. 2. Atherosclerosis. Electronically Signed   By: Vinnie Langton M.D.   On: 03/14/2015 19:28   I have personally reviewed and evaluated these images and lab results as part of my medical decision-making.   EKG Interpretation   Date/Time:  Wednesday March 14 2015 21:40:20  EST Ventricular Rate:  55 PR Interval:  186 QRS Duration: 111 QT Interval:  460 QTC Calculation: 440 R Axis:   49 Text Interpretation:  Normal sinus rhythm Posterior infarct, old No  significant change was found Confirmed by Glen Blatchley  MD, Lennette Bihari ([id number]) on  03/14/2015 10:14:28 PM      MDM   Final diagnoses:  None    I pathology evaluation is deferred to his ophthalmology team who has a plan in place.  No indication for emergent management of treatment by myself in the emergency department.  In regards to his chest pain and left arm pain they were 2 transient episodes and unrelated to one another.  Neither was with exertion.  They're both atypical in nature.  EKG 2) 2 are negative for ischemic changes.  No active symptoms at this time.  Outpatient cardiology and primary care follow-up    [facility],  MD 03/15/15 [id number]

## 2015-03-14 NOTE — ED Notes (Signed)
Called the [facility] and reported labs to him per prescription

## 2015-03-14 NOTE — ED Notes (Signed)
Order from [facility]: CBC with diff CRP ESR  Call results to [phone] and fax to [phone]

## 2015-03-15 ENCOUNTER — Encounter (INDEPENDENT_AMBULATORY_CARE_PROVIDER_SITE_OTHER): Payer: Medicare Other | Admitting: Ophthalmology

## 2015-03-15 DIAGNOSIS — H47012 Ischemic optic neuropathy, left eye: Secondary | ICD-10-CM

## 2015-03-15 DIAGNOSIS — H35033 Hypertensive retinopathy, bilateral: Secondary | ICD-10-CM | POA: Diagnosis not present

## 2015-03-15 DIAGNOSIS — H43813 Vitreous degeneration, bilateral: Secondary | ICD-10-CM

## 2015-03-15 DIAGNOSIS — H4602 Optic papillitis, left eye: Secondary | ICD-10-CM | POA: Diagnosis not present

## 2015-03-15 DIAGNOSIS — I10 Essential (primary) hypertension: Secondary | ICD-10-CM | POA: Diagnosis not present

## 2015-03-15 DIAGNOSIS — H353131 Nonexudative age-related macular degeneration, bilateral, early dry stage: Secondary | ICD-10-CM

## 2015-03-15 LAB — I-STAT TROPONIN, ED: Troponin i, poc: 0.01 ng/mL (ref 0.00–0.08)

## 2015-03-30 DIAGNOSIS — I10 Essential (primary) hypertension: Secondary | ICD-10-CM | POA: Diagnosis not present

## 2015-03-30 DIAGNOSIS — H47012 Ischemic optic neuropathy, left eye: Secondary | ICD-10-CM | POA: Diagnosis not present

## 2015-03-30 DIAGNOSIS — Z9842 Cataract extraction status, left eye: Secondary | ICD-10-CM | POA: Diagnosis not present

## 2015-03-30 DIAGNOSIS — Z7982 Long term (current) use of aspirin: Secondary | ICD-10-CM | POA: Diagnosis not present

## 2015-03-30 DIAGNOSIS — Z79899 Other long term (current) drug therapy: Secondary | ICD-10-CM | POA: Diagnosis not present

## 2015-03-30 DIAGNOSIS — Z961 Presence of intraocular lens: Secondary | ICD-10-CM | POA: Diagnosis not present

## 2015-03-30 DIAGNOSIS — H47292 Other optic atrophy, left eye: Secondary | ICD-10-CM | POA: Diagnosis not present

## 2015-03-30 DIAGNOSIS — H53432 Sector or arcuate defects, left eye: Secondary | ICD-10-CM | POA: Diagnosis not present

## 2015-04-17 DIAGNOSIS — I10 Essential (primary) hypertension: Secondary | ICD-10-CM | POA: Diagnosis not present

## 2015-04-17 DIAGNOSIS — Z961 Presence of intraocular lens: Secondary | ICD-10-CM | POA: Diagnosis not present

## 2015-04-17 DIAGNOSIS — H35432 Paving stone degeneration of retina, left eye: Secondary | ICD-10-CM | POA: Diagnosis not present

## 2015-04-17 DIAGNOSIS — H47292 Other optic atrophy, left eye: Secondary | ICD-10-CM | POA: Diagnosis not present

## 2015-04-17 DIAGNOSIS — H47012 Ischemic optic neuropathy, left eye: Secondary | ICD-10-CM | POA: Diagnosis not present

## 2015-04-17 DIAGNOSIS — H53432 Sector or arcuate defects, left eye: Secondary | ICD-10-CM | POA: Diagnosis not present

## 2015-04-28 DIAGNOSIS — H47292 Other optic atrophy, left eye: Secondary | ICD-10-CM | POA: Diagnosis not present

## 2015-05-08 DIAGNOSIS — G459 Transient cerebral ischemic attack, unspecified: Secondary | ICD-10-CM | POA: Diagnosis not present

## 2015-05-08 DIAGNOSIS — H5442 Blindness, left eye, normal vision right eye: Secondary | ICD-10-CM | POA: Diagnosis not present

## 2015-05-08 DIAGNOSIS — I10 Essential (primary) hypertension: Secondary | ICD-10-CM | POA: Diagnosis not present

## 2015-06-08 DIAGNOSIS — H53432 Sector or arcuate defects, left eye: Secondary | ICD-10-CM | POA: Diagnosis not present

## 2015-06-08 DIAGNOSIS — H47012 Ischemic optic neuropathy, left eye: Secondary | ICD-10-CM | POA: Diagnosis not present

## 2015-06-08 DIAGNOSIS — H47292 Other optic atrophy, left eye: Secondary | ICD-10-CM | POA: Diagnosis not present

## 2015-06-08 DIAGNOSIS — Z961 Presence of intraocular lens: Secondary | ICD-10-CM | POA: Diagnosis not present

## 2015-06-27 DIAGNOSIS — I10 Essential (primary) hypertension: Secondary | ICD-10-CM | POA: Diagnosis not present

## 2015-06-27 DIAGNOSIS — R7301 Impaired fasting glucose: Secondary | ICD-10-CM | POA: Diagnosis not present

## 2015-06-27 DIAGNOSIS — E782 Mixed hyperlipidemia: Secondary | ICD-10-CM | POA: Diagnosis not present

## 2015-06-29 DIAGNOSIS — I10 Essential (primary) hypertension: Secondary | ICD-10-CM | POA: Diagnosis not present

## 2015-06-29 DIAGNOSIS — R7301 Impaired fasting glucose: Secondary | ICD-10-CM | POA: Diagnosis not present

## 2015-06-29 DIAGNOSIS — M1991 Primary osteoarthritis, unspecified site: Secondary | ICD-10-CM | POA: Diagnosis not present

## 2015-06-29 DIAGNOSIS — K219 Gastro-esophageal reflux disease without esophagitis: Secondary | ICD-10-CM | POA: Diagnosis not present

## 2015-06-29 DIAGNOSIS — E782 Mixed hyperlipidemia: Secondary | ICD-10-CM | POA: Diagnosis not present

## 2015-08-01 DIAGNOSIS — J060 Acute laryngopharyngitis: Secondary | ICD-10-CM | POA: Diagnosis not present

## 2015-08-01 DIAGNOSIS — R05 Cough: Secondary | ICD-10-CM | POA: Diagnosis not present

## 2015-09-06 DIAGNOSIS — H47012 Ischemic optic neuropathy, left eye: Secondary | ICD-10-CM | POA: Diagnosis not present

## 2015-10-24 ENCOUNTER — Ambulatory Visit (HOSPITAL_COMMUNITY)
Admission: RE | Admit: 2015-10-24 | Discharge: 2015-10-24 | Disposition: A | Discharge status: Home / Self Care | Payer: Medicare Other | Source: Ambulatory Visit | Attending: Internal Medicine | Admitting: Internal Medicine

## 2015-10-24 ENCOUNTER — Other Ambulatory Visit (HOSPITAL_COMMUNITY): Payer: Self-pay | Admitting: Internal Medicine

## 2015-10-24 DIAGNOSIS — R059 Cough, unspecified: Secondary | ICD-10-CM

## 2015-10-24 DIAGNOSIS — R05 Cough: Secondary | ICD-10-CM

## 2015-10-24 DIAGNOSIS — R042 Hemoptysis: Secondary | ICD-10-CM | POA: Diagnosis not present

## 2015-12-20 DIAGNOSIS — I10 Essential (primary) hypertension: Secondary | ICD-10-CM | POA: Diagnosis not present

## 2015-12-20 DIAGNOSIS — Z125 Encounter for screening for malignant neoplasm of prostate: Secondary | ICD-10-CM | POA: Diagnosis not present

## 2015-12-20 DIAGNOSIS — E782 Mixed hyperlipidemia: Secondary | ICD-10-CM | POA: Diagnosis not present

## 2015-12-20 DIAGNOSIS — R7301 Impaired fasting glucose: Secondary | ICD-10-CM | POA: Diagnosis not present

## 2015-12-24 DIAGNOSIS — I10 Essential (primary) hypertension: Secondary | ICD-10-CM | POA: Diagnosis not present

## 2015-12-24 DIAGNOSIS — R7301 Impaired fasting glucose: Secondary | ICD-10-CM | POA: Diagnosis not present

## 2015-12-24 DIAGNOSIS — Z0000 Encounter for general adult medical examination without abnormal findings: Secondary | ICD-10-CM | POA: Diagnosis not present

## 2015-12-24 DIAGNOSIS — K219 Gastro-esophageal reflux disease without esophagitis: Secondary | ICD-10-CM | POA: Diagnosis not present

## 2015-12-24 DIAGNOSIS — E782 Mixed hyperlipidemia: Secondary | ICD-10-CM | POA: Diagnosis not present

## 2016-04-10 DIAGNOSIS — Z23 Encounter for immunization: Secondary | ICD-10-CM | POA: Diagnosis not present

## 2016-04-26 IMAGING — CR DG CHEST 2V
2 series · 2 of 2 positions shown · non-contrast
Comparison: Chest x-ray 05/26/2014.

CLINICAL DATA: 74-year-old male with midsternal chest pain
yesterday and left arm tingling today for 30 minutes.

EXAM:
CHEST  2 VIEW

[chest pa]
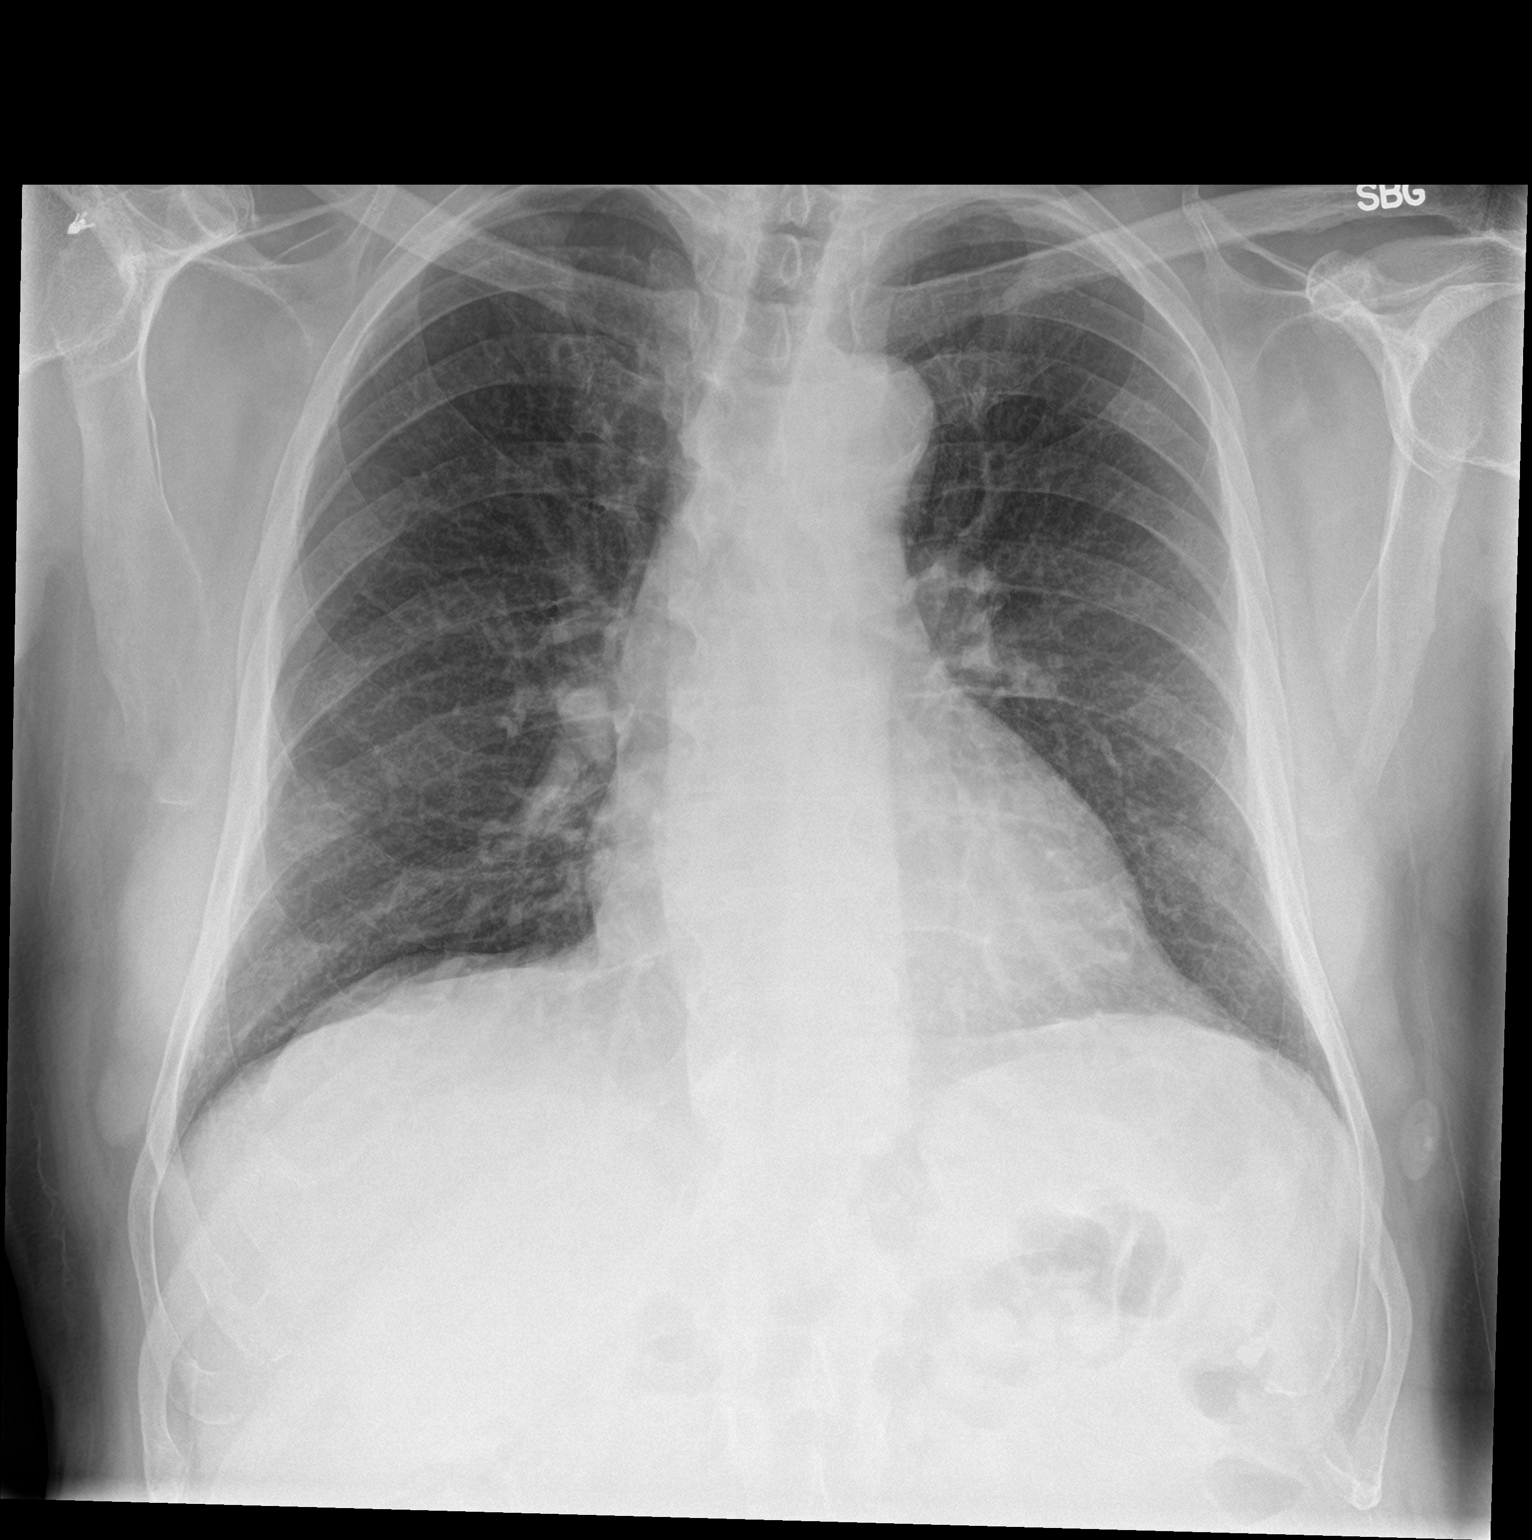

[chest lat]
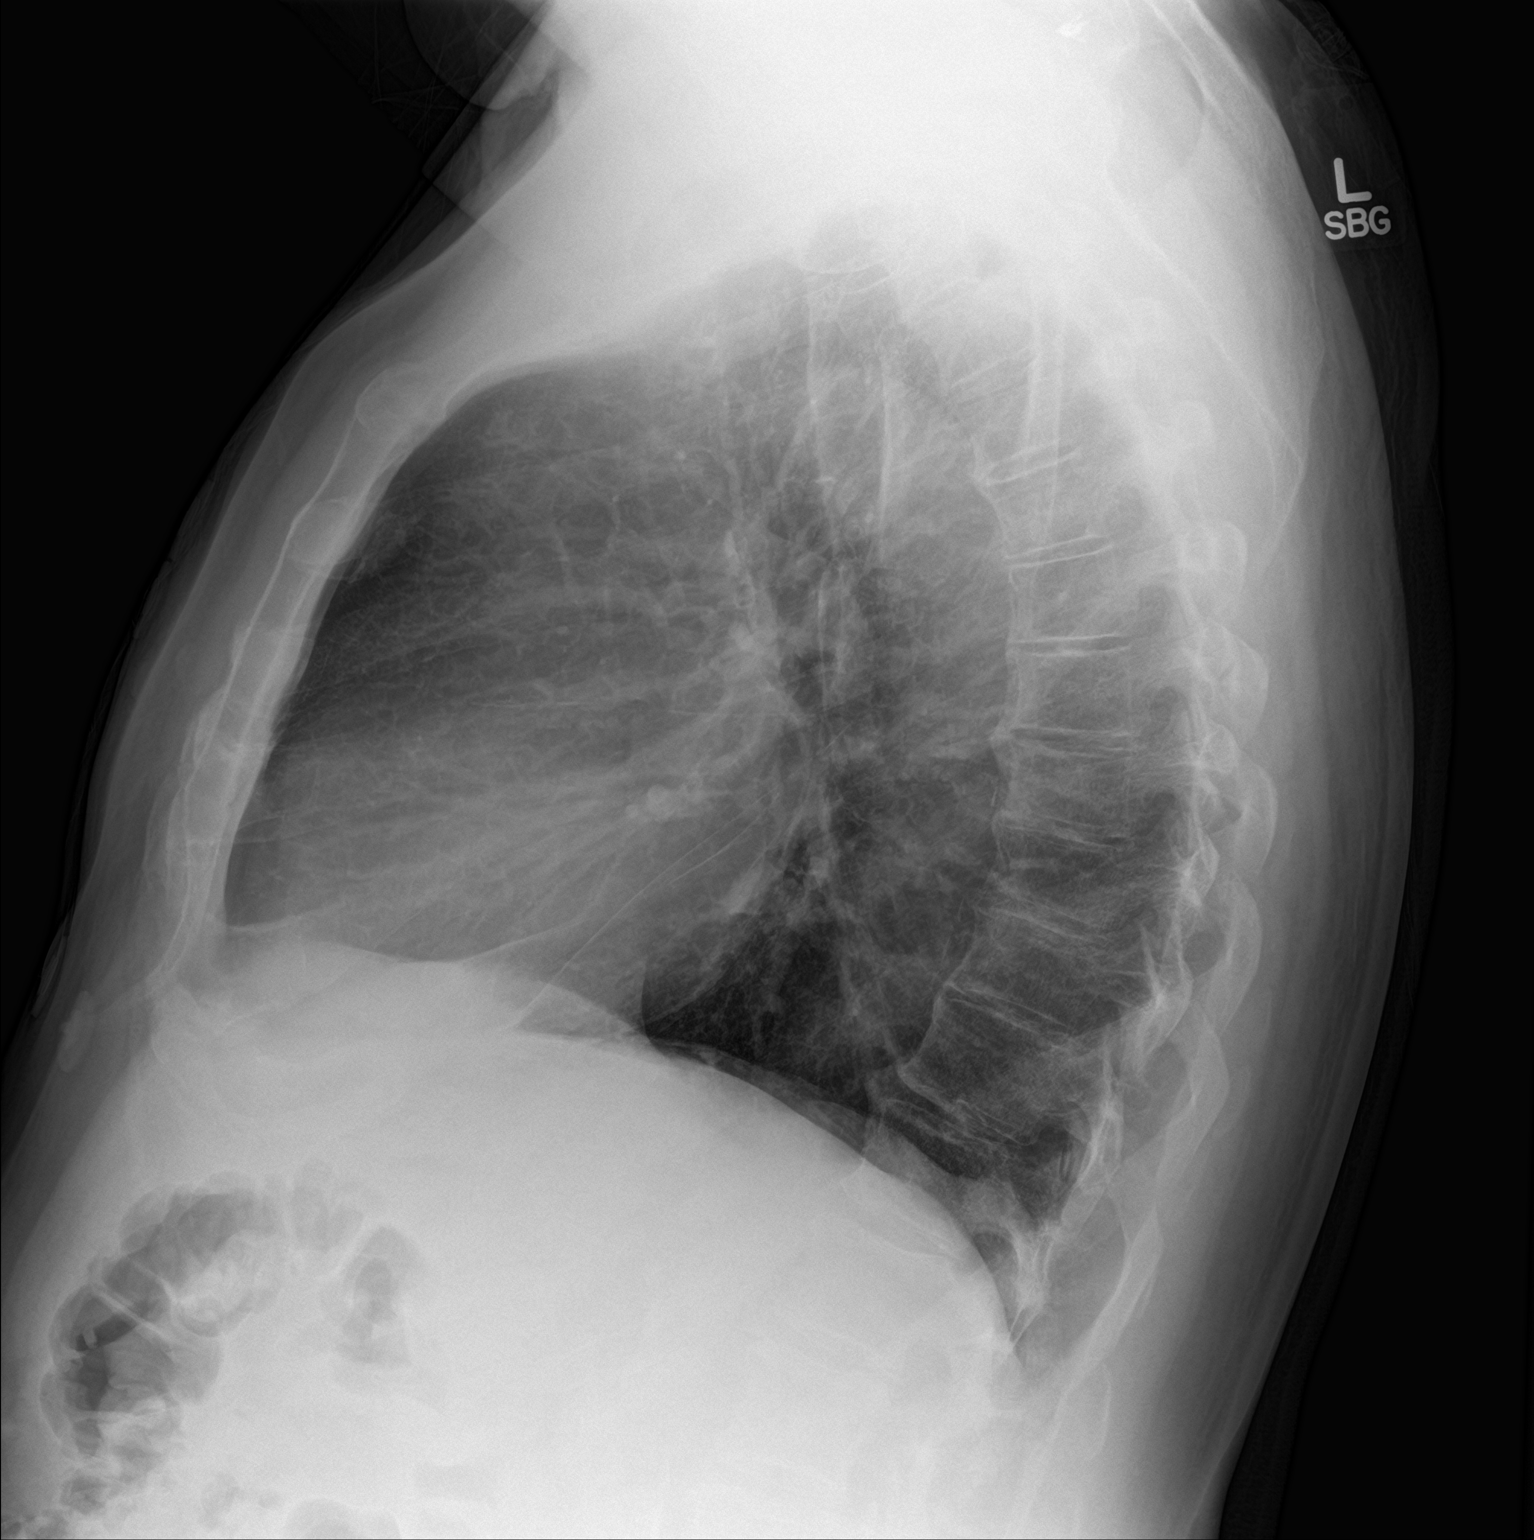

[2 of 2 positions shown; findings below may reference images not displayed]

FINDINGS: Mild diffuse peribronchial cuffing. Lung volumes are normal. No
consolidative airspace disease. No pleural effusions. No
pneumothorax. No pulmonary nodule or mass noted. Pulmonary
vasculature and the cardiomediastinal silhouette are within normal
limits. Atherosclerosis in the thoracic aorta.
IMPRESSION: 1. Mild diffuse peribronchial cuffing, suggestive of acute
bronchitis.
2. Atherosclerosis.

## 2016-06-25 DIAGNOSIS — R7301 Impaired fasting glucose: Secondary | ICD-10-CM | POA: Diagnosis not present

## 2016-06-25 DIAGNOSIS — E782 Mixed hyperlipidemia: Secondary | ICD-10-CM | POA: Diagnosis not present

## 2016-06-25 DIAGNOSIS — I10 Essential (primary) hypertension: Secondary | ICD-10-CM | POA: Diagnosis not present

## 2016-07-21 DIAGNOSIS — R7301 Impaired fasting glucose: Secondary | ICD-10-CM | POA: Diagnosis not present

## 2016-07-21 DIAGNOSIS — I10 Essential (primary) hypertension: Secondary | ICD-10-CM | POA: Diagnosis not present

## 2016-07-21 DIAGNOSIS — Z6830 Body mass index (BMI) 30.0-30.9, adult: Secondary | ICD-10-CM | POA: Diagnosis not present

## 2016-07-21 DIAGNOSIS — E782 Mixed hyperlipidemia: Secondary | ICD-10-CM | POA: Diagnosis not present

## 2016-08-19 DIAGNOSIS — H47012 Ischemic optic neuropathy, left eye: Secondary | ICD-10-CM | POA: Diagnosis not present

## 2016-08-19 DIAGNOSIS — H524 Presbyopia: Secondary | ICD-10-CM | POA: Diagnosis not present

## 2016-08-19 DIAGNOSIS — H43813 Vitreous degeneration, bilateral: Secondary | ICD-10-CM | POA: Diagnosis not present

## 2016-08-19 DIAGNOSIS — D3132 Benign neoplasm of left choroid: Secondary | ICD-10-CM | POA: Diagnosis not present

## 2016-08-19 DIAGNOSIS — H5201 Hypermetropia, right eye: Secondary | ICD-10-CM | POA: Diagnosis not present

## 2016-08-19 DIAGNOSIS — Z961 Presence of intraocular lens: Secondary | ICD-10-CM | POA: Diagnosis not present

## 2016-08-27 DIAGNOSIS — J0100 Acute maxillary sinusitis, unspecified: Secondary | ICD-10-CM | POA: Diagnosis not present

## 2016-09-25 DIAGNOSIS — H524 Presbyopia: Secondary | ICD-10-CM | POA: Diagnosis not present

## 2016-09-25 DIAGNOSIS — H26492 Other secondary cataract, left eye: Secondary | ICD-10-CM | POA: Diagnosis not present

## 2016-10-16 DIAGNOSIS — H26491 Other secondary cataract, right eye: Secondary | ICD-10-CM | POA: Diagnosis not present

## 2016-11-20 DIAGNOSIS — I10 Essential (primary) hypertension: Secondary | ICD-10-CM | POA: Diagnosis not present

## 2016-11-20 DIAGNOSIS — R7301 Impaired fasting glucose: Secondary | ICD-10-CM | POA: Diagnosis not present

## 2016-12-03 DIAGNOSIS — K219 Gastro-esophageal reflux disease without esophagitis: Secondary | ICD-10-CM | POA: Diagnosis not present

## 2016-12-03 DIAGNOSIS — E782 Mixed hyperlipidemia: Secondary | ICD-10-CM | POA: Diagnosis not present

## 2016-12-03 DIAGNOSIS — R7301 Impaired fasting glucose: Secondary | ICD-10-CM | POA: Diagnosis not present

## 2016-12-03 DIAGNOSIS — I10 Essential (primary) hypertension: Secondary | ICD-10-CM | POA: Diagnosis not present

## 2016-12-03 DIAGNOSIS — Z6829 Body mass index (BMI) 29.0-29.9, adult: Secondary | ICD-10-CM | POA: Diagnosis not present

## 2017-02-11 DIAGNOSIS — Z23 Encounter for immunization: Secondary | ICD-10-CM | POA: Diagnosis not present

## 2017-04-06 DIAGNOSIS — R7301 Impaired fasting glucose: Secondary | ICD-10-CM | POA: Diagnosis not present

## 2017-04-06 DIAGNOSIS — I10 Essential (primary) hypertension: Secondary | ICD-10-CM | POA: Diagnosis not present

## 2017-04-06 DIAGNOSIS — E782 Mixed hyperlipidemia: Secondary | ICD-10-CM | POA: Diagnosis not present

## 2017-04-08 DIAGNOSIS — I639 Cerebral infarction, unspecified: Secondary | ICD-10-CM | POA: Diagnosis not present

## 2017-04-08 DIAGNOSIS — I10 Essential (primary) hypertension: Secondary | ICD-10-CM | POA: Diagnosis not present

## 2017-04-08 DIAGNOSIS — M25551 Pain in right hip: Secondary | ICD-10-CM | POA: Diagnosis not present

## 2017-04-08 DIAGNOSIS — Z0000 Encounter for general adult medical examination without abnormal findings: Secondary | ICD-10-CM | POA: Diagnosis not present

## 2017-04-08 DIAGNOSIS — E782 Mixed hyperlipidemia: Secondary | ICD-10-CM | POA: Diagnosis not present

## 2017-04-08 DIAGNOSIS — D696 Thrombocytopenia, unspecified: Secondary | ICD-10-CM | POA: Diagnosis not present

## 2017-04-08 DIAGNOSIS — R7301 Impaired fasting glucose: Secondary | ICD-10-CM | POA: Diagnosis not present

## 2017-04-08 DIAGNOSIS — K219 Gastro-esophageal reflux disease without esophagitis: Secondary | ICD-10-CM | POA: Diagnosis not present

## 2017-04-08 DIAGNOSIS — Z23 Encounter for immunization: Secondary | ICD-10-CM | POA: Diagnosis not present

## 2017-06-09 DIAGNOSIS — K219 Gastro-esophageal reflux disease without esophagitis: Secondary | ICD-10-CM | POA: Diagnosis not present

## 2017-06-09 DIAGNOSIS — I639 Cerebral infarction, unspecified: Secondary | ICD-10-CM | POA: Diagnosis not present

## 2017-06-09 DIAGNOSIS — R7301 Impaired fasting glucose: Secondary | ICD-10-CM | POA: Diagnosis not present

## 2017-06-09 DIAGNOSIS — Z0000 Encounter for general adult medical examination without abnormal findings: Secondary | ICD-10-CM | POA: Diagnosis not present

## 2017-06-09 DIAGNOSIS — M25551 Pain in right hip: Secondary | ICD-10-CM | POA: Diagnosis not present

## 2017-06-09 DIAGNOSIS — D696 Thrombocytopenia, unspecified: Secondary | ICD-10-CM | POA: Diagnosis not present

## 2017-06-09 DIAGNOSIS — E782 Mixed hyperlipidemia: Secondary | ICD-10-CM | POA: Diagnosis not present

## 2017-06-09 DIAGNOSIS — R1011 Right upper quadrant pain: Secondary | ICD-10-CM | POA: Diagnosis not present

## 2017-06-09 DIAGNOSIS — Z23 Encounter for immunization: Secondary | ICD-10-CM | POA: Diagnosis not present

## 2017-06-09 DIAGNOSIS — I10 Essential (primary) hypertension: Secondary | ICD-10-CM | POA: Diagnosis not present

## 2017-08-28 DIAGNOSIS — H5201 Hypermetropia, right eye: Secondary | ICD-10-CM | POA: Diagnosis not present

## 2017-08-28 DIAGNOSIS — H47012 Ischemic optic neuropathy, left eye: Secondary | ICD-10-CM | POA: Diagnosis not present

## 2017-08-28 DIAGNOSIS — H53452 Other localized visual field defect, left eye: Secondary | ICD-10-CM | POA: Diagnosis not present

## 2017-08-28 DIAGNOSIS — H524 Presbyopia: Secondary | ICD-10-CM | POA: Diagnosis not present

## 2017-08-28 DIAGNOSIS — H4720 Unspecified optic atrophy: Secondary | ICD-10-CM | POA: Diagnosis not present

## 2017-08-28 DIAGNOSIS — H26491 Other secondary cataract, right eye: Secondary | ICD-10-CM | POA: Diagnosis not present

## 2017-10-19 DIAGNOSIS — R7301 Impaired fasting glucose: Secondary | ICD-10-CM | POA: Diagnosis not present

## 2017-10-19 DIAGNOSIS — E782 Mixed hyperlipidemia: Secondary | ICD-10-CM | POA: Diagnosis not present

## 2017-10-21 DIAGNOSIS — R7301 Impaired fasting glucose: Secondary | ICD-10-CM | POA: Diagnosis not present

## 2017-10-21 DIAGNOSIS — E782 Mixed hyperlipidemia: Secondary | ICD-10-CM | POA: Diagnosis not present

## 2017-10-21 DIAGNOSIS — D696 Thrombocytopenia, unspecified: Secondary | ICD-10-CM | POA: Diagnosis not present

## 2017-10-21 DIAGNOSIS — I639 Cerebral infarction, unspecified: Secondary | ICD-10-CM | POA: Diagnosis not present

## 2017-10-21 DIAGNOSIS — K219 Gastro-esophageal reflux disease without esophagitis: Secondary | ICD-10-CM | POA: Diagnosis not present

## 2017-10-21 DIAGNOSIS — I10 Essential (primary) hypertension: Secondary | ICD-10-CM | POA: Diagnosis not present

## 2018-01-20 DIAGNOSIS — Z6829 Body mass index (BMI) 29.0-29.9, adult: Secondary | ICD-10-CM | POA: Diagnosis not present

## 2018-01-20 DIAGNOSIS — R3989 Other symptoms and signs involving the genitourinary system: Secondary | ICD-10-CM | POA: Diagnosis not present

## 2018-01-20 DIAGNOSIS — M545 Low back pain: Secondary | ICD-10-CM | POA: Diagnosis not present

## 2018-02-04 DIAGNOSIS — X32XXXD Exposure to sunlight, subsequent encounter: Secondary | ICD-10-CM | POA: Diagnosis not present

## 2018-02-04 DIAGNOSIS — L570 Actinic keratosis: Secondary | ICD-10-CM | POA: Diagnosis not present

## 2018-02-04 DIAGNOSIS — D225 Melanocytic nevi of trunk: Secondary | ICD-10-CM | POA: Diagnosis not present

## 2018-09-20 DIAGNOSIS — Z0000 Encounter for general adult medical examination without abnormal findings: Secondary | ICD-10-CM | POA: Diagnosis not present

## 2018-09-28 DIAGNOSIS — R7301 Impaired fasting glucose: Secondary | ICD-10-CM | POA: Diagnosis not present

## 2018-09-28 DIAGNOSIS — I10 Essential (primary) hypertension: Secondary | ICD-10-CM | POA: Diagnosis not present

## 2018-09-28 DIAGNOSIS — E782 Mixed hyperlipidemia: Secondary | ICD-10-CM | POA: Diagnosis not present

## 2018-10-04 DIAGNOSIS — K219 Gastro-esophageal reflux disease without esophagitis: Secondary | ICD-10-CM | POA: Diagnosis not present

## 2018-10-04 DIAGNOSIS — Z8673 Personal history of transient ischemic attack (TIA), and cerebral infarction without residual deficits: Secondary | ICD-10-CM | POA: Diagnosis not present

## 2018-10-04 DIAGNOSIS — D696 Thrombocytopenia, unspecified: Secondary | ICD-10-CM | POA: Diagnosis not present

## 2018-10-04 DIAGNOSIS — E782 Mixed hyperlipidemia: Secondary | ICD-10-CM | POA: Diagnosis not present

## 2018-10-04 DIAGNOSIS — I10 Essential (primary) hypertension: Secondary | ICD-10-CM | POA: Diagnosis not present

## 2018-10-04 DIAGNOSIS — G4700 Insomnia, unspecified: Secondary | ICD-10-CM | POA: Diagnosis not present

## 2018-10-04 DIAGNOSIS — R7301 Impaired fasting glucose: Secondary | ICD-10-CM | POA: Diagnosis not present

## 2018-10-04 DIAGNOSIS — F321 Major depressive disorder, single episode, moderate: Secondary | ICD-10-CM | POA: Diagnosis not present

## 2018-10-04 DIAGNOSIS — Z749 Problem related to care provider dependency, unspecified: Secondary | ICD-10-CM | POA: Diagnosis not present

## 2018-11-24 ENCOUNTER — Other Ambulatory Visit: Payer: Self-pay

## 2018-11-24 ENCOUNTER — Other Ambulatory Visit: Payer: TRICARE For Life (TFL)

## 2018-11-24 DIAGNOSIS — Z20822 Contact with and (suspected) exposure to covid-19: Secondary | ICD-10-CM

## 2018-11-24 DIAGNOSIS — R6889 Other general symptoms and signs: Secondary | ICD-10-CM | POA: Diagnosis not present

## 2018-11-28 LAB — NOVEL CORONAVIRUS, NAA: SARS-CoV-2, NAA: NOT DETECTED

## 2018-12-02 ENCOUNTER — Other Ambulatory Visit: Payer: Self-pay

## 2019-02-14 DIAGNOSIS — I10 Essential (primary) hypertension: Secondary | ICD-10-CM | POA: Diagnosis not present

## 2019-02-14 DIAGNOSIS — E782 Mixed hyperlipidemia: Secondary | ICD-10-CM | POA: Diagnosis not present

## 2019-02-14 DIAGNOSIS — R7301 Impaired fasting glucose: Secondary | ICD-10-CM | POA: Diagnosis not present

## 2019-02-21 DIAGNOSIS — D696 Thrombocytopenia, unspecified: Secondary | ICD-10-CM | POA: Diagnosis not present

## 2019-02-21 DIAGNOSIS — I10 Essential (primary) hypertension: Secondary | ICD-10-CM | POA: Diagnosis not present

## 2019-02-21 DIAGNOSIS — R7301 Impaired fasting glucose: Secondary | ICD-10-CM | POA: Diagnosis not present

## 2019-02-21 DIAGNOSIS — Z749 Problem related to care provider dependency, unspecified: Secondary | ICD-10-CM | POA: Diagnosis not present

## 2019-02-21 DIAGNOSIS — F321 Major depressive disorder, single episode, moderate: Secondary | ICD-10-CM | POA: Diagnosis not present

## 2019-02-21 DIAGNOSIS — E782 Mixed hyperlipidemia: Secondary | ICD-10-CM | POA: Diagnosis not present

## 2019-02-21 DIAGNOSIS — G4700 Insomnia, unspecified: Secondary | ICD-10-CM | POA: Diagnosis not present

## 2019-02-21 DIAGNOSIS — Z8673 Personal history of transient ischemic attack (TIA), and cerebral infarction without residual deficits: Secondary | ICD-10-CM | POA: Diagnosis not present

## 2019-02-21 DIAGNOSIS — K219 Gastro-esophageal reflux disease without esophagitis: Secondary | ICD-10-CM | POA: Diagnosis not present

## 2019-06-16 ENCOUNTER — Other Ambulatory Visit: Payer: Self-pay

## 2019-06-16 ENCOUNTER — Ambulatory Visit: Payer: Medicare Other | Attending: Internal Medicine

## 2019-06-16 DIAGNOSIS — Z20822 Contact with and (suspected) exposure to covid-19: Secondary | ICD-10-CM | POA: Diagnosis not present

## 2019-06-17 ENCOUNTER — Telehealth: Payer: Self-pay

## 2019-06-17 ENCOUNTER — Telehealth: Payer: Self-pay | Admitting: *Deleted

## 2019-06-17 LAB — NOVEL CORONAVIRUS, NAA: SARS-CoV-2, NAA: NOT DETECTED

## 2019-06-17 NOTE — Telephone Encounter (Signed)
Received call from patients daughter wanting her fathers COVID-19 test result. She was informed that I was unable  to give health information to her.  No DPR. She will call back with her father present.

## 2019-06-17 NOTE — Telephone Encounter (Signed)
He called in requesting his COVID-19 test result.    I let him know it was not detected meaning he did not have the virus.  He thanked me for my help and quick service.

## 2019-07-19 DIAGNOSIS — H5201 Hypermetropia, right eye: Secondary | ICD-10-CM | POA: Diagnosis not present

## 2019-07-19 DIAGNOSIS — H4720 Unspecified optic atrophy: Secondary | ICD-10-CM | POA: Diagnosis not present

## 2019-07-19 DIAGNOSIS — H47012 Ischemic optic neuropathy, left eye: Secondary | ICD-10-CM | POA: Diagnosis not present

## 2019-07-19 DIAGNOSIS — H524 Presbyopia: Secondary | ICD-10-CM | POA: Diagnosis not present

## 2019-07-19 DIAGNOSIS — H353111 Nonexudative age-related macular degeneration, right eye, early dry stage: Secondary | ICD-10-CM | POA: Diagnosis not present

## 2019-07-19 DIAGNOSIS — H26491 Other secondary cataract, right eye: Secondary | ICD-10-CM | POA: Diagnosis not present

## 2019-07-28 DIAGNOSIS — R7301 Impaired fasting glucose: Secondary | ICD-10-CM | POA: Diagnosis not present

## 2019-07-28 DIAGNOSIS — K219 Gastro-esophageal reflux disease without esophagitis: Secondary | ICD-10-CM | POA: Diagnosis not present

## 2019-07-28 DIAGNOSIS — E782 Mixed hyperlipidemia: Secondary | ICD-10-CM | POA: Diagnosis not present

## 2019-07-28 DIAGNOSIS — F321 Major depressive disorder, single episode, moderate: Secondary | ICD-10-CM | POA: Diagnosis not present

## 2019-07-28 DIAGNOSIS — I10 Essential (primary) hypertension: Secondary | ICD-10-CM | POA: Diagnosis not present

## 2019-07-28 DIAGNOSIS — M545 Low back pain: Secondary | ICD-10-CM | POA: Diagnosis not present

## 2019-07-28 DIAGNOSIS — D696 Thrombocytopenia, unspecified: Secondary | ICD-10-CM | POA: Diagnosis not present

## 2019-07-28 DIAGNOSIS — M25551 Pain in right hip: Secondary | ICD-10-CM | POA: Diagnosis not present

## 2019-07-28 DIAGNOSIS — R1011 Right upper quadrant pain: Secondary | ICD-10-CM | POA: Diagnosis not present

## 2019-07-28 DIAGNOSIS — I639 Cerebral infarction, unspecified: Secondary | ICD-10-CM | POA: Diagnosis not present

## 2019-07-28 DIAGNOSIS — R3989 Other symptoms and signs involving the genitourinary system: Secondary | ICD-10-CM | POA: Diagnosis not present

## 2019-07-28 DIAGNOSIS — G4700 Insomnia, unspecified: Secondary | ICD-10-CM | POA: Diagnosis not present

## 2019-08-03 DIAGNOSIS — E782 Mixed hyperlipidemia: Secondary | ICD-10-CM | POA: Diagnosis not present

## 2019-08-03 DIAGNOSIS — Z6826 Body mass index (BMI) 26.0-26.9, adult: Secondary | ICD-10-CM | POA: Diagnosis not present

## 2019-08-03 DIAGNOSIS — R7301 Impaired fasting glucose: Secondary | ICD-10-CM | POA: Diagnosis not present

## 2019-08-03 DIAGNOSIS — Z8673 Personal history of transient ischemic attack (TIA), and cerebral infarction without residual deficits: Secondary | ICD-10-CM | POA: Diagnosis not present

## 2019-08-03 DIAGNOSIS — D696 Thrombocytopenia, unspecified: Secondary | ICD-10-CM | POA: Diagnosis not present

## 2019-08-03 DIAGNOSIS — K219 Gastro-esophageal reflux disease without esophagitis: Secondary | ICD-10-CM | POA: Diagnosis not present

## 2019-08-03 DIAGNOSIS — E663 Overweight: Secondary | ICD-10-CM | POA: Diagnosis not present

## 2019-08-03 DIAGNOSIS — I10 Essential (primary) hypertension: Secondary | ICD-10-CM | POA: Diagnosis not present

## 2019-08-03 DIAGNOSIS — G4700 Insomnia, unspecified: Secondary | ICD-10-CM | POA: Diagnosis not present

## 2019-08-03 DIAGNOSIS — F321 Major depressive disorder, single episode, moderate: Secondary | ICD-10-CM | POA: Diagnosis not present

## 2019-08-06 ENCOUNTER — Observation Stay (HOSPITAL_COMMUNITY): Payer: Medicare Other

## 2019-08-06 ENCOUNTER — Observation Stay (HOSPITAL_BASED_OUTPATIENT_CLINIC_OR_DEPARTMENT_OTHER): Payer: Medicare Other

## 2019-08-06 ENCOUNTER — Emergency Department (HOSPITAL_COMMUNITY): Payer: Medicare Other

## 2019-08-06 ENCOUNTER — Encounter (HOSPITAL_COMMUNITY): Payer: Self-pay

## 2019-08-06 ENCOUNTER — Observation Stay (HOSPITAL_COMMUNITY)
Admission: EM | Admit: 2019-08-06 | Discharge: 2019-08-07 | Disposition: A | Discharge status: Home / Self Care | Payer: Medicare Other | Attending: Internal Medicine | Admitting: Internal Medicine

## 2019-08-06 DIAGNOSIS — I10 Essential (primary) hypertension: Secondary | ICD-10-CM | POA: Insufficient documentation

## 2019-08-06 DIAGNOSIS — Z66 Do not resuscitate: Secondary | ICD-10-CM | POA: Diagnosis not present

## 2019-08-06 DIAGNOSIS — Z7982 Long term (current) use of aspirin: Secondary | ICD-10-CM | POA: Insufficient documentation

## 2019-08-06 DIAGNOSIS — R55 Syncope and collapse: Principal | ICD-10-CM | POA: Insufficient documentation

## 2019-08-06 DIAGNOSIS — Z634 Disappearance and death of family member: Secondary | ICD-10-CM | POA: Diagnosis not present

## 2019-08-06 DIAGNOSIS — Z03818 Encounter for observation for suspected exposure to other biological agents ruled out: Secondary | ICD-10-CM | POA: Diagnosis not present

## 2019-08-06 DIAGNOSIS — K219 Gastro-esophageal reflux disease without esophagitis: Secondary | ICD-10-CM | POA: Diagnosis not present

## 2019-08-06 DIAGNOSIS — R4189 Other symptoms and signs involving cognitive functions and awareness: Secondary | ICD-10-CM | POA: Insufficient documentation

## 2019-08-06 DIAGNOSIS — R4781 Slurred speech: Secondary | ICD-10-CM | POA: Diagnosis not present

## 2019-08-06 DIAGNOSIS — F329 Major depressive disorder, single episode, unspecified: Secondary | ICD-10-CM | POA: Diagnosis not present

## 2019-08-06 DIAGNOSIS — R270 Ataxia, unspecified: Secondary | ICD-10-CM | POA: Diagnosis not present

## 2019-08-06 DIAGNOSIS — R404 Transient alteration of awareness: Secondary | ICD-10-CM | POA: Diagnosis not present

## 2019-08-06 DIAGNOSIS — Z20822 Contact with and (suspected) exposure to covid-19: Secondary | ICD-10-CM | POA: Insufficient documentation

## 2019-08-06 DIAGNOSIS — E785 Hyperlipidemia, unspecified: Secondary | ICD-10-CM | POA: Diagnosis not present

## 2019-08-06 DIAGNOSIS — R2681 Unsteadiness on feet: Secondary | ICD-10-CM | POA: Insufficient documentation

## 2019-08-06 DIAGNOSIS — Z79899 Other long term (current) drug therapy: Secondary | ICD-10-CM | POA: Diagnosis not present

## 2019-08-06 DIAGNOSIS — R45851 Suicidal ideations: Secondary | ICD-10-CM | POA: Diagnosis not present

## 2019-08-06 DIAGNOSIS — R531 Weakness: Secondary | ICD-10-CM | POA: Diagnosis not present

## 2019-08-06 DIAGNOSIS — R4020 Unspecified coma: Secondary | ICD-10-CM | POA: Diagnosis not present

## 2019-08-06 DIAGNOSIS — R29818 Other symptoms and signs involving the nervous system: Secondary | ICD-10-CM | POA: Diagnosis not present

## 2019-08-06 DIAGNOSIS — R29810 Facial weakness: Secondary | ICD-10-CM | POA: Diagnosis not present

## 2019-08-06 DIAGNOSIS — F32A Depression, unspecified: Secondary | ICD-10-CM

## 2019-08-06 DIAGNOSIS — Z791 Long term (current) use of non-steroidal anti-inflammatories (NSAID): Secondary | ICD-10-CM | POA: Insufficient documentation

## 2019-08-06 DIAGNOSIS — I491 Atrial premature depolarization: Secondary | ICD-10-CM | POA: Diagnosis not present

## 2019-08-06 LAB — COMPREHENSIVE METABOLIC PANEL
ALT: 29 U/L (ref 0–44)
ALT: 28 U/L (ref 0–44)
AST: 30 U/L (ref 15–41)
AST: 25 U/L (ref 15–41)
Albumin: 4.6 g/dL (ref 3.5–5.0)
Albumin: 4.1 g/dL (ref 3.5–5.0)
Alkaline Phosphatase: 99 U/L (ref 38–126)
Alkaline Phosphatase: 85 U/L (ref 38–126)
Anion gap: 8 (ref 5–15)
Anion gap: 8 (ref 5–15)
BUN: 22 mg/dL (ref 8–23)
BUN: 19 mg/dL (ref 8–23)
CO2: 25 mmol/L (ref 22–32)
CO2: 26 mmol/L (ref 22–32)
Calcium: 9.2 mg/dL (ref 8.9–10.3)
Calcium: 9 mg/dL (ref 8.9–10.3)
Chloride: 103 mmol/L (ref 98–111)
Chloride: 106 mmol/L (ref 98–111)
Creatinine, Ser: 0.89 mg/dL (ref 0.61–1.24)
Creatinine, Ser: 0.77 mg/dL (ref 0.61–1.24)
GFR calc Af Amer: >60 mL/min (ref >60)
GFR calc Af Amer: >60 mL/min (ref >60)
GFR calc non Af Amer: >60 mL/min (ref >60)
GFR calc non Af Amer: >60 mL/min (ref >60)
Glucose, Bld: 134 mg/dL — ABNORMAL HIGH (ref 70–99)
Glucose, Bld: 111 mg/dL — ABNORMAL HIGH (ref 70–99)
Potassium: 3.8 mmol/L (ref 3.5–5.1)
Potassium: 3.7 mmol/L (ref 3.5–5.1)
Sodium: 136 mmol/L (ref 135–145)
Sodium: 140 mmol/L (ref 135–145)
Total Bilirubin: 0.5 mg/dL (ref 0.3–1.2)
Total Bilirubin: 0.6 mg/dL (ref 0.3–1.2)
Total Protein: 7.4 g/dL (ref 6.5–8.1)
Total Protein: 6.5 g/dL (ref 6.5–8.1)

## 2019-08-06 LAB — ECHOCARDIOGRAM COMPLETE: Weight: 2910.4 oz

## 2019-08-06 LAB — DIFFERENTIAL
Abs Immature Granulocytes: 0.03 10*3/uL (ref 0.00–0.07)
Basophils Absolute: 0 10*3/uL (ref 0.0–0.1)
Basophils Relative: 0 %
Eosinophils Absolute: 0.1 10*3/uL (ref 0.0–0.5)
Eosinophils Relative: 2 %
Immature Granulocytes: 1 %
Lymphocytes Relative: 29 %
Lymphs Abs: 1.7 10*3/uL (ref 0.7–4.0)
Monocytes Absolute: 0.5 10*3/uL (ref 0.1–1.0)
Monocytes Relative: 9 %
Neutro Abs: 3.5 10*3/uL (ref 1.7–7.7)
Neutrophils Relative %: 59 %

## 2019-08-06 LAB — CBC
HCT: 43.8 % (ref 39.0–52.0)
HCT: 42.5 % (ref 39.0–52.0)
Hemoglobin: 14.7 g/dL (ref 13.0–17.0)
Hemoglobin: 14.7 g/dL (ref 13.0–17.0)
MCH: 33.3 pg (ref 26.0–34.0)
MCH: 34 pg (ref 26.0–34.0)
MCHC: 33.6 g/dL (ref 30.0–36.0)
MCHC: 34.6 g/dL (ref 30.0–36.0)
MCV: 99.1 fL (ref 80.0–100.0)
MCV: 98.4 fL (ref 80.0–100.0)
Platelets: 112 10*3/uL — ABNORMAL LOW (ref 150–400)
Platelets: 107 10*3/uL — ABNORMAL LOW (ref 150–400)
RBC: 4.42 MIL/uL (ref 4.22–5.81)
RBC: 4.32 MIL/uL (ref 4.22–5.81)
RDW: 12.1 % (ref 11.5–15.5)
RDW: 12 % (ref 11.5–15.5)
WBC: 5.8 10*3/uL (ref 4.0–10.5)
WBC: 5.5 10*3/uL (ref 4.0–10.5)
nRBC: 0 % (ref 0.0–0.2)
nRBC: 0 % (ref 0.0–0.2)

## 2019-08-06 LAB — URINALYSIS, ROUTINE W REFLEX MICROSCOPIC
Bilirubin Urine: NEGATIVE
Glucose, UA: NEGATIVE mg/dL
Hgb urine dipstick: NEGATIVE
Ketones, ur: NEGATIVE mg/dL
Leukocytes,Ua: NEGATIVE
Nitrite: NEGATIVE
Protein, ur: NEGATIVE mg/dL
Specific Gravity, Urine: 1.013 (ref 1.005–1.030)
pH: 7 (ref 5.0–8.0)

## 2019-08-06 LAB — RAPID URINE DRUG SCREEN, HOSP PERFORMED
Amphetamines: NOT DETECTED
Barbiturates: NOT DETECTED
Benzodiazepines: POSITIVE — AB
Cocaine: NOT DETECTED
Opiates: NOT DETECTED
Tetrahydrocannabinol: NOT DETECTED

## 2019-08-06 LAB — T4, FREE: Free T4: 0.82 ng/dL (ref 0.61–1.12)

## 2019-08-06 LAB — VITAMIN B12: Vitamin B-12: 205 pg/mL (ref 180–914)

## 2019-08-06 LAB — TSH: TSH: 2.151 u[IU]/mL (ref 0.350–4.500)

## 2019-08-06 LAB — APTT: aPTT: 23 seconds — ABNORMAL LOW (ref 24–36)

## 2019-08-06 LAB — PROTIME-INR
INR: 1 (ref 0.8–1.2)
Prothrombin Time: 12.9 seconds (ref 11.4–15.2)

## 2019-08-06 LAB — TROPONIN I (HIGH SENSITIVITY): Troponin I (High Sensitivity): 8 ng/L (ref <18)

## 2019-08-06 LAB — SARS CORONAVIRUS 2 (TAT 6-24 HRS): SARS Coronavirus 2: NEGATIVE

## 2019-08-06 LAB — ETHANOL: Alcohol, Ethyl (B): <10 mg/dL (ref <10)

## 2019-08-06 MED ORDER — ENOXAPARIN SODIUM 40 MG/0.4ML ~~LOC~~ SOLN
40.0000 mg | SUBCUTANEOUS | Status: DC
  Administered 2019-08-06: 40 mg via SUBCUTANEOUS
  Filled 2019-08-06 (×2): qty 0.4

## 2019-08-06 MED ORDER — CLOPIDOGREL BISULFATE 75 MG PO TABS
75.0000 mg | ORAL_TABLET | Freq: Every day | ORAL | Status: DC
  Administered 2019-08-06 – 2019-08-07 (×2): 75 mg via ORAL
  Filled 2019-08-06 (×2): qty 1

## 2019-08-06 MED ORDER — ASPIRIN EC 81 MG PO TBEC
81.0000 mg | DELAYED_RELEASE_TABLET | Freq: Every morning | ORAL | Status: DC
  Administered 2019-08-06 – 2019-08-07 (×2): 81 mg via ORAL
  Filled 2019-08-06 (×2): qty 1

## 2019-08-06 MED ORDER — ONDANSETRON HCL 4 MG/2ML IJ SOLN: 4.0000 mg | Freq: Four times a day (QID) | INTRAMUSCULAR | Status: DC | PRN

## 2019-08-06 MED ORDER — HYDRALAZINE HCL 25 MG PO TABS: 25.0000 mg | ORAL_TABLET | Freq: Four times a day (QID) | ORAL | Status: DC | PRN

## 2019-08-06 MED ORDER — SODIUM CHLORIDE 0.9 % IV SOLN: 250.0000 mL | INTRAVENOUS | Status: DC | PRN

## 2019-08-06 MED ORDER — SODIUM CHLORIDE 0.9% FLUSH: 3.0000 mL | INTRAVENOUS | Status: DC | PRN

## 2019-08-06 MED ORDER — ACETAMINOPHEN 650 MG RE SUPP: 650.0000 mg | Freq: Four times a day (QID) | RECTAL | Status: DC | PRN

## 2019-08-06 MED ORDER — ATORVASTATIN CALCIUM 40 MG PO TABS
40.0000 mg | ORAL_TABLET | Freq: Every day | ORAL | Status: DC
  Administered 2019-08-06 – 2019-08-07 (×2): 40 mg via ORAL
  Filled 2019-08-06 (×2): qty 1

## 2019-08-06 MED ORDER — SODIUM CHLORIDE 0.9% FLUSH
3.0000 mL | Freq: Two times a day (BID) | INTRAVENOUS | Status: DC
  Administered 2019-08-06 – 2019-08-07 (×3): 3 mL via INTRAVENOUS

## 2019-08-06 MED ORDER — ONDANSETRON HCL 4 MG PO TABS: 4.0000 mg | ORAL_TABLET | Freq: Four times a day (QID) | ORAL | Status: DC | PRN

## 2019-08-06 MED ORDER — ACETAMINOPHEN 325 MG PO TABS: 650.0000 mg | ORAL_TABLET | Freq: Four times a day (QID) | ORAL | Status: DC | PRN

## 2019-08-06 NOTE — ED Triage Notes (Signed)
Pt in by [facility] from home.  Pt lkn 0015, was walking through the kitchen when he suddenly felt weak in his legs, did not fall.  Pt now with right side weakness, slurred speech, right facial droop.

## 2019-08-06 NOTE — ED Notes (Signed)
Spoke with tele sitter and let them know pt was being transferred to [place].

## 2019-08-06 NOTE — Evaluation (Signed)
Physical Therapy Evaluation Patient Details Name: Philip Allen MRN: [id number] DOB: March 28, 1941 Today's Date: 08/06/2019   History of Present Illness  79 yo male with onset of sudden LE weakness and supported fall was noted to have susp orthostatic reaction vs TIA.  Imaging is negative for stroke, ongoing medical evaluation.  PMHx:  R shoulder sx, TURP, BPH, GERD, HTN,    Clinical Impression  Pt was assessed for orthostatics, noted supine 149/78; sitting 163/84;  Standing 142/102.  Pt is walking without an AD but is demonstrating stiffness of quality.  Has been at [place] with his wife who passed away after several months last year, and instructed pt on DF and hip ext stretches to manage changes from his activity level.  Follow acutely to monitor BP and follow up with his exercises, but will not need home therapy upon dc.    Follow Up Recommendations No PT follow up    Equipment Recommendations  None recommended by PT    Recommendations for Other Services       Precautions / Restrictions Precautions Precautions: Fall Precaution Comments: monitor BP Restrictions Weight Bearing Restrictions: No      Mobility  Bed Mobility Overal bed mobility: Needs Assistance Bed Mobility: Supine to Sit;Sit to Supine     Supine to sit: Supervision Sit to supine: Supervision   General bed mobility comments: supervision for safety  Transfers Overall transfer level: Needs assistance Equipment used: None Transfers: Sit to/from Stand Sit to Stand: Min guard         General transfer comment: min guard for initial eval safety  Ambulation/Gait Ambulation/Gait assistance: Min guard(for safety) Gait Distance (Feet): 160 Feet Assistive device: 1 person hand held assist Gait Pattern/deviations: Step-through pattern;Decreased stride length;Wide base of support;Trunk flexed Gait velocity: reduced Gait velocity interpretation: <1.31 ft/sec, indicative of household ambulator General Gait Details:  adjustments to stiffness in hips and ankles  Stairs            Wheelchair Mobility    Modified Rankin (Stroke Patients Only)       Balance Overall balance assessment: Needs assistance Sitting-balance support: Feet supported Sitting balance-Leahy Scale: Good     Standing balance support: Single extremity supported;During functional activity Standing balance-Leahy Scale: Fair                               Pertinent Vitals/Pain Pain Assessment: No/denies pain    Home Living Family/patient expects to be discharged to:: Private residence Living Arrangements: Alone Available Help at Discharge: Family;Available 24 hours/day(temporarily) Type of Home: House Home Access: Stairs to enter Entrance Stairs-Rails: Can reach both Entrance Stairs-Number of Steps: 2 Home Layout: Two level;Able to live on main level with bedroom/bathroom Home Equipment: Gilford Rile - 2 wheels;Walker - 4 wheels;Cane - single point Additional Comments: home with his neice who is leaving in a few days    Prior Function Level of Independence: Independent         Comments: no AD needed previously     Hand Dominance   Dominant Hand: Right    Extremity/Trunk Assessment   Upper Extremity Assessment Upper Extremity Assessment: Overall WFL for tasks assessed    Lower Extremity Assessment Lower Extremity Assessment: Overall WFL for tasks assessed    Cervical / Trunk Assessment Cervical / Trunk Assessment: Kyphotic(mild)  Communication   Communication: No difficulties  Cognition Arousal/Alertness: Awake/alert Behavior During Therapy: WFL for tasks assessed/performed Overall Cognitive Status: Within Functional Limits for  tasks assessed                                        General Comments General comments (skin integrity, edema, etc.): pt is walking with no falls but has moments of readjusting his balance due to limitations of hips and knees.  Had BP ck for  orthostatics with no excessively low numbers    Exercises General Exercises - Lower Extremity Ankle Circles/Pumps: AROM;5 reps Other Exercises Other Exercises: hip extension stretches   Assessment/Plan    PT Assessment Patient needs continued PT services  PT Problem List Decreased range of motion;Decreased balance;Decreased coordination       PT Treatment Interventions DME instruction;Gait training;Stair training;Functional mobility training;Therapeutic activities;Therapeutic exercise;Balance training;Neuromuscular re-education;Patient/family education    PT Goals (Current goals can be found in the Care Plan section)  Acute Rehab PT Goals Patient Stated Goal: to walk and get home PT Goal Formulation: With patient Time For Goal Achievement: 08/13/19 Potential to Achieve Goals: Good    Frequency Min 3X/week   Barriers to discharge Decreased caregiver support home alone usually    Co-evaluation               AM-PAC PT "6 Clicks" Mobility  Outcome Measure Help needed turning from your back to your side while in a flat bed without using bedrails?: None Help needed moving from lying on your back to sitting on the side of a flat bed without using bedrails?: None Help needed moving to and from a bed to a chair (including a wheelchair)?: A Little Help needed standing up from a chair using your arms (e.g., wheelchair or bedside chair)?: A Little Help needed to walk in hospital room?: A Little Help needed climbing 3-5 steps with a railing? : A Little 6 Click Score: 20    End of Session Equipment Utilized During Treatment: Gait belt Activity Tolerance: Patient tolerated treatment well;Treatment limited secondary to medical complications (Comment) Patient left: in bed;with call bell/phone within reach;with bed alarm set Nurse Communication: Mobility status;Other (comment)(BP numbers given to MD and nursing) PT Visit Diagnosis: Unsteadiness on feet (R26.81);Difficulty in  walking, not elsewhere classified (R26.2)    Time: [id number] PT Time Calculation (min) (ACUTE ONLY): 24 min   Charges:   PT Evaluation $PT Eval Moderate Complexity: 1 Mod PT Treatments $Gait Training: 8-22 mins       Ramond Dial 08/06/2019, 3:28 PM   Mee Hives, PT MS Acute Rehab Dept. Number: [place] and [place]

## 2019-08-06 NOTE — Progress Notes (Signed)
Beeper 0118 Exam started 0123  Exam ended 0126 Rad called imags sent to soc and exam finished 0127  ER Dr. Tomi Bamberger [phone]  [facility]

## 2019-08-06 NOTE — Progress Notes (Signed)
  Echocardiogram 2D Echocardiogram has been performed.  Philip Allen 08/06/2019, 11:42 AM

## 2019-08-06 NOTE — Consult Note (Addendum)
Gwenyth Allegra Euline Kimbler 08/06/2019, 1:52 AM  [facility] TeleNeurology Consult Services   Date of Service:   08/06/2019 01:27:00  Impression:     .  G45.9 - Transient cerebral ischemic attack, unspecified  Comments/Sign-Out: is not clear from the history whether he had a TIA or a near syncopal/syncopal event related to possible orthostasis from getting up quickly. To sort that out with an MRI of the head would be useful since he had symptoms for more than 30 minutes so often ischemia will be noted on the MRI if this was a cerebrovascular event. he would also benefit from starting on telemetry and evaluation of orthostatic blood pressure. In the interim I would put him on dual antiplatelet therapy and then he will need neurology follow-up after studies to determine whether to continue that or not. he would benefit from an MRI of the head to see if there is any evidence of cerebral vascular ischemia, along with checking orthostasis,  Metrics: Last Known Well: 08/06/2019 00:15:00 [facility] Notification Time: 08/06/2019 01:27:00 Arrival Time: 08/06/2019 01:19:00 Stamp Time: 08/06/2019 01:27:00 Time First Login Attempt: 08/06/2019 01:28:42 Symptoms: weakness NIHSS Start Assessment Time: 08/06/2019 01:37:15 Patient is not a candidate for Alteplase/Activase. Alteplase Medical Decision: 08/06/2019 01:44:23 Patient was not deemed candidate for Alteplase/Activase thrombolytics because of Resolved symptoms (no residual disabling symptoms).  CT head showed no acute hemorrhage or acute core infarct.  Clinical Presentation is not Suggestive of Large Vessel Occlusive Disease  ED Physician notified of diagnostic impression and management plan on 08/06/2019 01:48:09  Our recommendations are outlined below.  Recommendations:     .  Activate Stroke Protocol Admission/Order Set     .  Stroke/Telemetry Floor     .  Neuro Checks     .  Bedside Swallow Eval     .  DVT Prophylaxis     .  IV  Fluids, Normal Saline     .  Head of Bed 30 Degrees     .  Euglycemia and Avoid Hyperthermia (PRN Acetaminophen)     .  Initiate Aspirin 81 MG Daily     .  Initiate Plavix 75 MG Daily     .  MRI head     .  Telemetry     .   orthostatic pressures  Routine Consultation with [facility] for Follow up Care  Sign Out:     .  Discussed with Emergency Department Provider    ------------------------------------------------------------------------------  History of Present Illness: Patient is a 79 year old Male.  Patient was brought by EMS for symptoms of weakness  this is an 79 year old male who was in bedand then he got out of bed and went into his kitchen and then felt generally weak including both legs. He thinks he might a blacked out for a second but is not certain about that. At this point he feels significantly better and essentially back to normal.   Past Medical History:     . Hypertension     . Hyperlipidemia   Antiplatelet use: aspirin    Examination: BP(165/74), Pulse(72), Blood Glucose(138) 1A: Level of Consciousness - Alert; keenly responsive + 0 1B: Ask Month and Age - Both Questions Right + 0 1C: Blink Eyes & Squeeze Hands - Performs Both Tasks + 0 2: Test Horizontal Extraocular Movements - Normal + 0 3: Test Visual Fields - No Visual Loss + 0 4: Test Facial Palsy (Use Grimace if Obtunded) - Normal symmetry + 0 5A: Test Left Arm Motor  Drift - No Drift for 10 Seconds + 0 5B: Test Right Arm Motor Drift - No Drift for 10 Seconds + 0 6A: Test Left Leg Motor Drift - No Drift for 5 Seconds + 0 6B: Test Right Leg Motor Drift - No Drift for 5 Seconds + 0 7: Test Limb Ataxia (FNF/Heel-Shin) - No Ataxia + 0 8: Test Sensation - Normal; No sensory loss + 0 9: Test Language/Aphasia - Normal; No aphasia + 0 10: Test Dysarthria - Normal + 0 11: Test Extinction/Inattention - No abnormality + 0  NIHSS Score: 0  Pre-Morbid Modified Ranking Scale: 0 Points = No  symptoms at all   Patient/Family was informed the Neurology Consult would occur via TeleHealth consult by way of interactive audio and video telecommunications and consented to receiving care in this manner.   Due to the immediate potential for life-threatening deterioration due to underlying acute neurologic illness, I spent 20 minutes providing critical care. This time includes time for face to face visit via telemedicine, review of medical records, imaging studies and discussion of findings with providers, the patient and/or family.   Dr Janean Sark   [facility] [phone]  Case ON:[id number]

## 2019-08-06 NOTE — H&P (Signed)
TRH H&P    Patient Demographics:    Philip Allen, is a 79 y.o. male  MRN: [id number]  DOB - 09/11/1940  Admit Date - 08/06/2019  Referring MD/NP/PA: Dr. Tomi Bamberger  Outpatient Primary MD for the patient is System, Pcp Not In  Patient coming from: Home  Chief complaint-generalized weakness   HPI:    Philip Allen  is a 79 y.o. male, with medical history of BPH, status post TURP, GERD, hepatitis B, hypertension who was brought to hospital after episode of generalized weakness/presyncope.  As per patient he was in bed around 1 AM when he got out of bed to going to kitchen he felt weak in both legs.  Patient says that he sat on the floor with help of his granddaughter.  He is not sure whether he passed out or not. EMS was called, patient was brought to hospital and code stroke was called.  Patient was evaluated by tele neurology.  Patient did not have neurological deficits. Neurology recommended MRI brain, continue with aspirin and add Plavix 75 mg daily. Patient denies chest pain or shortness of breath. Denies fever or chills. Denies focal weakness of extremities. Denies slurred speech. Patient said that he may have had blurred vision at the time of the episode. He also admits to having suicidal thoughts.  Patient's wife died last year in 11-19-22.    Review of systems:    In addition to the HPI above,    All other systems reviewed and are negative.    Past History of the following :    Past Medical History:  Diagnosis Date  . BPH (benign prostatic hypertrophy)   . Frequency of urination   . GERD (gastroesophageal reflux disease)   . 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 Hyperlipidemia   . Hypertension   . Nocturia   . Urgency of urination       Past Surgical History:  Procedure Laterality Date  . RIGHT SHOULDER SURGERY  1998  . TRANSURETHRAL RESECTION OF PROSTATE  02/05/2012     Procedure: TRANSURETHRAL RESECTION OF THE PROSTATE WITH GYRUS INSTRUMENTS;  Surgeon: Ailene Rud, MD;  Location: [facility];  Service: Urology;  Laterality: N/A;  WITH GYRUS SUPRAPUBIC TUBE OWER TO MAIN      Social History:      Social History   Tobacco Use  . Smoking status: Never Smoker  . Smokeless tobacco: Never Used  Substance Use Topics  . Alcohol use: Yes    Comment: OCCASIONAL        Family History :   No pertinent family history   Home Medications:   Prior to Admission medications   Medication Sig Start Date End Date Taking? Authorizing Provider  amLODipine (NORVASC) 5 MG tablet Take 5 mg by mouth every morning.    [provider]  aspirin 81 MG tablet Take 81 mg by mouth every morning.    [provider]  atorvastatin (LIPITOR) 40 MG tablet Take 40 mg by mouth daily.  [provider]  celecoxib (CELEBREX) 200 MG capsule Take 400 mg by mouth at bedtime.    [provider]  ciprofloxacin (CIPRO) 500 MG tablet Take 500 mg by mouth 2 (two) times daily. 02/06/12   Carolan Clines, MD  diazepam (VALIUM) 5 MG tablet Take 1 tablet (5 mg total) by mouth every 6 (six) hours as needed for anxiety (spasms). 05/17/12   Pisciotta, Elmyra Ricks, PA-C  esomeprazole (NEXIUM) 40 MG capsule Take 40 mg by mouth daily before breakfast.    [provider]  hydrocodone-acetaminophen (LORCET PLUS) 7.5-650 MG per tablet Take 1 tablet by mouth every 6 (six) hours as needed for pain. 02/06/12   Carolan Clines, MD  temazepam (RESTORIL) 15 MG capsule Take 30 mg by mouth at bedtime as needed.     [provider]     Allergies:     Allergies  Allergen Reactions  . Naproxen Hives     Physical Exam:   Vitals  Blood pressure 139/79, pulse 62, temperature 97.8 F (36.6 C), temperature source Oral, resp. rate 18, weight 82.5 kg, SpO2 99 %.  1.  General: Appears in no acute distress  2.  Psychiatric: Alert, oriented x3, intact insight and judgment  3. Neurologic: Cranial nerves II through XII grossly intact, no focal deficit noted, motor strength 5/5 in all extremities  4. HEENMT:  Atraumatic normocephalic, extraocular muscles are intact  5. Respiratory : Clear to auscultation bilaterally, no wheezing or crackles auscultated  6. Cardiovascular : S1-S2, regular, no murmur auscultated, no edema in the lower extremities  7. Gastrointestinal:  Abdomen is soft, nontender, no organomegaly      Data Review:    CBC Recent Labs  Lab 08/06/19 0138  WBC 5.8  HGB 14.7  HCT 43.8  PLT 112*  MCV 99.1  MCH 33.3  MCHC 33.6  RDW 12.1  LYMPHSABS 1.7  MONOABS 0.5  EOSABS 0.1  BASOSABS 0.0   ------------------------------------------------------------------------------------------------------------------  Results for orders placed or performed during the hospital encounter of 08/06/19 (from the past 48 hour(s))  Ethanol     Status: None   Collection Time: 08/06/19  1:38 AM  Result Value Ref Range   Alcohol, Ethyl (B) <10 <10 mg/dL    Comment: (NOTE) Lowest detectable limit for serum alcohol is 10 mg/dL. For medical purposes only. Performed at [facility], [place], [address] [place], [place] [address]   Protime-INR     Status: None   Collection Time: 08/06/19  1:38 AM  Result Value Ref Range   Prothrombin Time 12.9 11.4 - 15.2 seconds   INR 1.0 0.8 - 1.2    Comment: (NOTE) INR goal varies based on device and disease states. Performed at [facility], [address] [place], [place] [address]   APTT     Status: Abnormal   Collection Time: 08/06/19  1:38 AM  Result Value Ref Range   aPTT 23 (L) 24 - 36 seconds    Comment: Performed at [facility], [address] [place], [place] [address]  CBC     Status: Abnormal   Collection Time: 08/06/19  1:38 AM  Result Value Ref Range   WBC 5.8 4.0 - 10.5 K/uL   RBC 4.42 4.22 - 5.81 MIL/uL   Hemoglobin 14.7 13.0 -  17.0 g/dL   HCT 43.8 39.0 - 52.0 %   MCV 99.1 80.0 - 100.0 fL   MCH 33.3 26.0 - 34.0 pg   MCHC 33.6 30.0 - 36.0 g/dL   RDW  12.1 11.5 - 15.5 %   Platelets 112 (L) 150 - 400 K/uL    Comment: SPECIMEN CHECKED FOR CLOTS   nRBC 0.0 0.0 - 0.2 %    Comment: Performed at [facility], [address], [place], [place] [address]  Differential     Status: None   Collection Time: 08/06/19  1:38 AM  Result Value Ref Range   Neutrophils Relative % 59 %   Neutro Abs 3.5 1.7 - 7.7 K/uL   Lymphocytes Relative 29 %   Lymphs Abs 1.7 0.7 - 4.0 K/uL   Monocytes Relative 9 %   Monocytes Absolute 0.5 0.1 - 1.0 K/uL   Eosinophils Relative 2 %   Eosinophils Absolute 0.1 0.0 - 0.5 K/uL   Basophils Relative 0 %   Basophils Absolute 0.0 0.0 - 0.1 K/uL   Immature Granulocytes 1 %   Abs Immature Granulocytes 0.03 0.00 - 0.07 K/uL    Comment: Performed at [facility], [address], [place], [place] [address]  Comprehensive metabolic panel     Status: Abnormal   Collection Time: 08/06/19  1:38 AM  Result Value Ref Range   Sodium 136 135 - 145 mmol/L   Potassium 3.8 3.5 - 5.1 mmol/L   Chloride 103 98 - 111 mmol/L   CO2 25 22 - 32 mmol/L   Glucose, Bld 134 (H) 70 - 99 mg/dL    Comment: Glucose reference range applies only to samples taken after fasting for at least 8 hours.   BUN 22 8 - 23 mg/dL   Creatinine, Ser 0.89 0.61 - 1.24 mg/dL   Calcium 9.2 8.9 - 10.3 mg/dL   Total Protein 7.4 6.5 - 8.1 g/dL   Albumin 4.6 3.5 - 5.0 g/dL   AST 30 15 - 41 U/L   ALT 29 0 - 44 U/L   Alkaline Phosphatase 99 38 - 126 U/L   Total Bilirubin 0.5 0.3 - 1.2 mg/dL   GFR calc non Af Amer >60 >60 mL/min   GFR calc Af Amer >60 >60 mL/min   Anion gap 8 5 - 15    Comment: Performed at [facility], [address] [place], [place] [address]    Chemistries  Recent Labs  Lab 08/06/19 0138  NA 136  K 3.8  CL 103  CO2 25  GLUCOSE 134*  BUN 22  CREATININE 0.89  CALCIUM 9.2  AST 30  ALT 29  ALKPHOS 99  BILITOT 0.5    ------------------------------------------------------------------------------------------------------------------  ------------------------------------------------------------------------------------------------------------------ GFR: CrCl cannot be calculated (Unknown ideal weight.). Liver Function Tests: Recent Labs  Lab 08/06/19 0138  AST 30  ALT 29  ALKPHOS 99  BILITOT 0.5  PROT 7.4  ALBUMIN 4.6   No results for input(s): LIPASE, AMYLASE in the last 168 hours. No results for input(s): AMMONIA in the last 168 hours. Coagulation Profile: Recent Labs  Lab 08/06/19 0138  INR 1.0   Cardiac Enzymes: No results for input(s): CKTOTAL, CKMB, CKMBINDEX, TROPONINI in the last 168 hours. BNP (last 3 results) No results for input(s): PROBNP in the last 8760 hours. HbA1C: No results for input(s): HGBA1C in the last 72 hours. CBG: No results for input(s): GLUCAP in the last 168 hours. Lipid Profile: No results for input(s): CHOL, HDL, LDLCALC, TRIG, CHOLHDL, LDLDIRECT in the last 72 hours. Thyroid Function Tests: No results for input(s): TSH, T4TOTAL, FREET4, T3FREE, THYROIDAB in the last 72 hours. Anemia Panel: No results for input(s): VITAMINB12, FOLATE, FERRITIN, TIBC, IRON, RETICCTPCT in the last 72  hours.  --------------------------------------------------------------------------------------------------------------- Urine analysis: No results found for: COLORURINE, APPEARANCEUR, LABSPEC, PHURINE, GLUCOSEU, HGBUR, BILIRUBINUR, KETONESUR, PROTEINUR, UROBILINOGEN, NITRITE, LEUKOCYTESUR    Imaging Results:    CT HEAD CODE STROKE WO CONTRAST  Result Date: 08/06/2019 CLINICAL DATA:  Code stroke.  Right-sided weakness and facial droop EXAM: CT HEAD WITHOUT CONTRAST TECHNIQUE: Contiguous axial images were obtained from the base of the skull through the vertex without intravenous contrast. COMPARISON:  None. FINDINGS: Brain: There is no mass, hemorrhage or extra-axial collection.  The size and configuration of the ventricles and extra-axial CSF spaces are normal. 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 Vascular: No abnormal hyperdensity of the major intracranial arteries or dural venous sinuses. No intracranial atherosclerosis. Skull: The visualized skull base, calvarium and extracranial soft tissues are normal. Sinuses/Orbits: No fluid levels or advanced mucosal thickening of the visualized paranasal sinuses. No mastoid or middle ear effusion. The orbits are normal. ASPECTS [facility] Stroke Program Early CT Score) - Ganglionic level infarction (caudate, lentiform nuclei, internal capsule, insula, M1-M3 cortex): 7 - Supraganglionic infarction (M4-M6 cortex): 3 Total score (0-10 with 10 being normal): 10 IMPRESSION: 1. 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 2. ASPECTS is 10. * These results were called by telephone at the time of interpretation on 08/06/2019 at 1:34 am to provider IVA KNAPP , who verbally acknowledged these results. Electronically Signed   By: Ulyses Jarred M.D.   On: 08/06/2019 01:34    My personal review of EKG: Rhythm NSR, nonspecific ST changes   Assessment & Plan:    Active Problems:   Syncope   1. Syncope versus TIA-patient had episode of generalized weakness with legs giving out.  CT head is negative for stroke, neurology recommended to add Plavix to aspirin and obtain MRI brain in a.m. to rule out stroke.  Will obtain MRI brain without contrast, orthostatic vital signs every shift.  Will check troponin x2.  Continue to hold antihypertensive medications for permissive hypertension.  Start hydralazine 25 mg every 6 hours as needed for BP greater than 220/110. 2. Suicidal ideation/questionable depression-patient admits to having suicidal thoughts since his wife died last year.  Will start suicide precautions with sitter at bedside.  Consider  psychiatric evaluation in a.m. 3. Hypertension-hold amlodipine for permissive hypertension.  If MRI negative for stroke consider restarting amlodipine.    DVT Prophylaxis-   Lovenox  AM Labs Ordered, also please review Full Orders  Family Communication: Admission, patients condition and plan of care including tests being ordered have been discussed with the patient  who indicate understanding and agree with the plan and Code Status.  Code Status: DNR  Admission status: Observation  Time spent in minutes : 60 minutes   Narissa Beaufort S Kavion Mancinas M.D

## 2019-08-06 NOTE — Progress Notes (Addendum)
Patient seen and examined, admitted earlier this morning by Dr. Claud Kelp. -Briefly this is an 79 year old male with history of BPH, TURP, hypertension, hepatitis B was brought to the emergency room following a syncopal event early this morning, he was transferred from [facility], ED for MRI. -Patient reports getting out of bed around 1 AM to go to the kitchen when he suddenly felt lightheaded, weak in both legs, followed by loss of consciousness, subsequently found by his granddaughter on the floor who started CPR on him. -He regained consciousness and was brought to the ED, reports both his legs being weak subsequently  Syncope -Etiology is unclear at this time -Monitor on telemetry, EKG with TWI in inferior leads -MRI was done this morning given concern for leg weakness, this is negative for acute findings, old cerebellar infarct noted -Check 2D echocardiogram -Orthostatics -He does have a few PACs, check TSH as well, free T4 -PT eval  Depression Bereavement -Family reports that patient has been depressed since his wife's passing last summer -He denies any suicidal intent or ideation, had extensive discussion with the patient regarding this, he tells me that whenever his time comes he is at peace and does not want any extraordinary measures to be kept alive  -Conservation officer, nature, discussed with family as well  Hypertension -Amlodipine on hold -Check orthostatics  Memory, cognitive deficits -Check TSH and B12 -OT eval  Domenic Polite, MD

## 2019-08-06 NOTE — ED Provider Notes (Signed)
[facility] EMERGENCY DEPARTMENT Provider Note   CSN: [id number] Arrival date & time: 08/06/19  0119  An emergency department physician performed an initial assessment on this suspected stroke patient at [place].Time seen 1:19 AM on arrival  History Chief Complaint  Patient presents with  . Code Stroke   Level 5 caveat for altered mental status  Philip Allen is a 79 y.o. male.  HPI   Per EMS patient was last seen normal at 12:15 AM. Just prior to arrival he was walking into the kitchen in complains of his legs getting numb. EMS reports he has some right-sided weakness. Patient is slow to respond. He is able to grasp my fingers. He opens his eyes however he does not follow commands to smile.  PCP System, Pcp Not In   Past Medical History:  Diagnosis Date  . BPH (benign prostatic hypertrophy)   . Frequency of urination   . GERD (gastroesophageal reflux disease)   . 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 Hyperlipidemia   . Hypertension   . Nocturia   . Urgency of urination     Patient Active Problem List   Diagnosis Date Noted  . Syncope 08/06/2019    Past Surgical History:  Procedure Laterality Date  . RIGHT SHOULDER SURGERY  1998  . TRANSURETHRAL RESECTION OF PROSTATE  02/05/2012   Procedure: TRANSURETHRAL RESECTION OF THE PROSTATE WITH GYRUS INSTRUMENTS;  Surgeon: Ailene Rud, MD;  Location: [facility];  Service: Urology;  Laterality: N/A;  WITH GYRUS SUPRAPUBIC TUBE OWER TO MAIN       No family history on file.  Social History   Tobacco Use  . Smoking status: Never Smoker  . Smokeless tobacco: Never Used  Substance Use Topics  . Alcohol use: Yes    Comment: OCCASIONAL   . Drug use: No  lives at home  Home Medications Prior to Admission medications   Medication Sig Start Date End Date Taking? Authorizing Provider  amLODipine (NORVASC) 5 MG tablet Take 5 mg by mouth every morning.    [provider]    aspirin 81 MG tablet Take 81 mg by mouth every morning.    [provider]  atorvastatin (LIPITOR) 40 MG tablet Take 40 mg by mouth daily.    [provider]  celecoxib (CELEBREX) 200 MG capsule Take 400 mg by mouth at bedtime.    [provider]  ciprofloxacin (CIPRO) 500 MG tablet Take 500 mg by mouth 2 (two) times daily. 02/06/12   Carolan Clines, MD  diazepam (VALIUM) 5 MG tablet Take 1 tablet (5 mg total) by mouth every 6 (six) hours as needed for anxiety (spasms). 05/17/12   Pisciotta, Elmyra Ricks, PA-C  esomeprazole (NEXIUM) 40 MG capsule Take 40 mg by mouth daily before breakfast.    [provider]  hydrocodone-acetaminophen (LORCET PLUS) 7.5-650 MG per tablet Take 1 tablet by mouth every 6 (six) hours as needed for pain. 02/06/12   Carolan Clines, MD  temazepam (RESTORIL) 15 MG capsule Take 30 mg by mouth at bedtime as needed.     [provider]  l  Allergies    Naproxen  Review of Systems   Review of Systems  All other systems reviewed and are negative.   Physical Exam Updated Vital Signs BP 139/79 (BP Location: Right Arm)   Pulse 62   Temp 97.8 F (36.6 C) (Oral)   Resp 18   Wt 82.5 kg  SpO2 99%   BMI 28.49 kg/m   Physical Exam Vitals and nursing note reviewed.  Constitutional:      Appearance: Normal appearance.     Comments: Patient has his eyes closed he does slowly open his eyes to verbal commands  HENT:     Head: Normocephalic and atraumatic.     Right Ear: External ear normal.     Left Ear: External ear normal.  Eyes:     Extraocular Movements: Extraocular movements intact.     Conjunctiva/sclera: Conjunctivae normal.     Pupils: Pupils are equal, round, and reactive to light.  Cardiovascular:     Rate and Rhythm: Normal rate and regular rhythm.     Pulses: Normal pulses.  Pulmonary:     Effort: Pulmonary effort is normal. No respiratory distress.     Breath sounds: Normal breath sounds.   Musculoskeletal:        General: Normal range of motion.     Cervical back: Normal range of motion.  Skin:    General: Skin is warm and dry.     Findings: No erythema or rash.  Neurological:     Comments: Patient appears to be generally diffusely weak without localization.  There is no obvious pronator drift.  Grips are equal but weak bilaterally.  He is able to hold his lower legs up against gravity but with difficulty.  Psychiatric:        Mood and Affect: Affect is flat.        Speech: Speech is delayed.        Behavior: Behavior is slowed.     Orthostatic VS ordered, I cannot find results, nurse reports they were "good"  ED Results / Procedures / Treatments   Labs (all labs ordered are listed, but only abnormal results are displayed) Results for orders placed or performed during the hospital encounter of 08/06/19  Ethanol  Result Value Ref Range   Alcohol, Ethyl (B) <10 <10 mg/dL  Protime-INR  Result Value Ref Range   Prothrombin Time 12.9 11.4 - 15.2 seconds   INR 1.0 0.8 - 1.2  APTT  Result Value Ref Range   aPTT 23 (L) 24 - 36 seconds  CBC  Result Value Ref Range   WBC 5.8 4.0 - 10.5 K/uL   RBC 4.42 4.22 - 5.81 MIL/uL   Hemoglobin 14.7 13.0 - 17.0 g/dL   HCT 43.8 39.0 - 52.0 %   MCV 99.1 80.0 - 100.0 fL   MCH 33.3 26.0 - 34.0 pg   MCHC 33.6 30.0 - 36.0 g/dL   RDW 12.1 11.5 - 15.5 %   Platelets 112 (L) 150 - 400 K/uL   nRBC 0.0 0.0 - 0.2 %  Differential  Result Value Ref Range   Neutrophils Relative % 59 %   Neutro Abs 3.5 1.7 - 7.7 K/uL   Lymphocytes Relative 29 %   Lymphs Abs 1.7 0.7 - 4.0 K/uL   Monocytes Relative 9 %   Monocytes Absolute 0.5 0.1 - 1.0 K/uL   Eosinophils Relative 2 %   Eosinophils Absolute 0.1 0.0 - 0.5 K/uL   Basophils Relative 0 %   Basophils Absolute 0.0 0.0 - 0.1 K/uL   Immature Granulocytes 1 %   Abs Immature Granulocytes 0.03 0.00 - 0.07 K/uL  Comprehensive metabolic panel  Result Value Ref Range   Sodium 136 135 - 145  mmol/L   Potassium 3.8 3.5 - 5.1 mmol/L   Chloride 103 98 - 111 mmol/L  CO2 25 22 - 32 mmol/L   Glucose, Bld 134 (H) 70 - 99 mg/dL   BUN 22 8 - 23 mg/dL   Creatinine, Ser 0.89 0.61 - 1.24 mg/dL   Calcium 9.2 8.9 - 10.3 mg/dL   Total Protein 7.4 6.5 - 8.1 g/dL   Albumin 4.6 3.5 - 5.0 g/dL   AST 30 15 - 41 U/L   ALT 29 0 - 44 U/L   Alkaline Phosphatase 99 38 - 126 U/L   Total Bilirubin 0.5 0.3 - 1.2 mg/dL   GFR calc non Af Amer >60 >60 mL/min   GFR calc Af Amer >60 >60 mL/min   Anion gap 8 5 - 15   Laboratory interpretation all normal except hyperglycemia nonfasting sample   EKG EKG Interpretation  Date/Time:  Saturday August 06 2019 01:31:03 EDT Ventricular Rate:  61 PR Interval:    QRS Duration: 111 QT Interval:  421 QTC Calculation: 424 R Axis:   43 Text Interpretation: Sinus rhythm Ventricular premature complex Minimal ST depression, lateral leads No significant change since last tracing 14 Mar 2015 Confirmed by Rolland Porter [phone]) on 08/06/2019 2:28:09 AM   Radiology CT HEAD CODE STROKE WO CONTRAST  Result Date: 08/06/2019 CLINICAL DATA:  Code stroke.  Right-sided weakness and facial droop EXAM: CT HEAD WITHOUT CONTRAST TECHNIQUE: Contiguous axial images were obtained from the base of the skull through the vertex without intravenous contrast. COMPARISON:  None. FINDINGS: Brain: There is no mass, hemorrhage or extra-axial collection. The size and configuration of the ventricles and extra-axial CSF spaces are normal. 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 Vascular: No abnormal hyperdensity of the major intracranial arteries or dural venous sinuses. No intracranial atherosclerosis. Skull: The visualized skull base, calvarium and extracranial soft tissues are normal. Sinuses/Orbits: No fluid levels or advanced mucosal thickening of the visualized paranasal sinuses. No mastoid or middle ear effusion. The orbits are normal. ASPECTS [facility]  Stroke Program Early CT Score) - Ganglionic level infarction (caudate, lentiform nuclei, internal capsule, insula, M1-M3 cortex): 7 - Supraganglionic infarction (M4-M6 cortex): 3 Total score (0-10 with 10 being normal): 10 IMPRESSION: 1. 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 2. ASPECTS is 10. * These results were called by telephone at the time of interpretation on 08/06/2019 at 1:34 am to provider Revanth Neidig , who verbally acknowledged these results. Electronically Signed   By: Ulyses Jarred M.D.   On: 08/06/2019 01:34    Procedures .Critical Care Performed by: Rolland Porter, MD Authorized by: Rolland Porter, MD   Critical care provider statement:    Critical care time (minutes):  35   Critical care was necessary to treat or prevent imminent or life-threatening deterioration of the following conditions:  CNS failure or compromise   Critical care was time spent personally by me on the following activities:  Discussions with consultants, examination of patient, obtaining history from patient or surrogate, ordering and review of radiographic studies, ordering and review of laboratory studies and re-evaluation of patient's condition   (including critical care time)  Medications Ordered in ED Medications - No data to display  ED Course  I have reviewed the triage vital signs and the nursing notes.  Pertinent labs & imaging results that were available during my care of the patient were reviewed by me and considered in my medical decision making (see chart for details).    MDM Rules/Calculators/A&P  Code stroke orders were started, patient was taken straight to CT by EMS  1:34 AM Dr Collins [place], radiologist called his CT report which was without acute findings.  Nurse reports during her triage assessment patient did report that he did not want to live.  His wife died in November 01, 2022 and he "wants to go join her".  1:30 AM I  was in the room during part of the teleneurologist assessment.  Patient seems to be a little bit more alert than when I first saw him.  1:48 AM Dr Jack Quarto, teleneurology has finished their assessment.  Patient reported to him that both his legs felt weak and he almost passed out.  He states the patient is already on aspirin which she would continue and he would add Plavix.  He was suggested orthostatic vital signs and MRI.  2:45 AM I went in to talk to the patient.  He seems much more alert now and his color seems better.  We discussed his test results and recommendation that he be admitted and the fact that MRI will not be here for another 2 days he would need to go to [place].  He seems agreeable.  03:05 AM Dr Darrick Meigs, hospitalist, will admit  Kennon Holter was ordered due to patient feeling depressed and like he wants to hurt himself.     Final Clinical Impression(s) / ED Diagnoses Final diagnoses:  Weakness  Depression, unspecified depression type    Rx / DC Orders  Plan admission  Rolland Porter, MD, Barbette Or, MD 08/06/19 [phone]

## 2019-08-07 DIAGNOSIS — F329 Major depressive disorder, single episode, unspecified: Secondary | ICD-10-CM

## 2019-08-07 DIAGNOSIS — R531 Weakness: Secondary | ICD-10-CM | POA: Diagnosis not present

## 2019-08-07 DIAGNOSIS — F32A Depression, unspecified: Secondary | ICD-10-CM

## 2019-08-07 DIAGNOSIS — R55 Syncope and collapse: Secondary | ICD-10-CM | POA: Diagnosis not present

## 2019-08-07 LAB — CBC
HCT: 44.5 % (ref 39.0–52.0)
Hemoglobin: 15.4 g/dL (ref 13.0–17.0)
MCH: 34.1 pg — ABNORMAL HIGH (ref 26.0–34.0)
MCHC: 34.6 g/dL (ref 30.0–36.0)
MCV: 98.5 fL (ref 80.0–100.0)
Platelets: 123 10*3/uL — ABNORMAL LOW (ref 150–400)
RBC: 4.52 MIL/uL (ref 4.22–5.81)
RDW: 12 % (ref 11.5–15.5)
WBC: 5 10*3/uL (ref 4.0–10.5)
nRBC: 0 % (ref 0.0–0.2)

## 2019-08-07 LAB — BASIC METABOLIC PANEL
Anion gap: 8 (ref 5–15)
BUN: 12 mg/dL (ref 8–23)
CO2: 29 mmol/L (ref 22–32)
Calcium: 9.5 mg/dL (ref 8.9–10.3)
Chloride: 105 mmol/L (ref 98–111)
Creatinine, Ser: 1.04 mg/dL (ref 0.61–1.24)
GFR calc Af Amer: >60 mL/min (ref >60)
GFR calc non Af Amer: >60 mL/min (ref >60)
Glucose, Bld: 128 mg/dL — ABNORMAL HIGH (ref 70–99)
Potassium: 4.1 mmol/L (ref 3.5–5.1)
Sodium: 142 mmol/L (ref 135–145)

## 2019-08-07 MED ORDER — ACETAMINOPHEN 325 MG PO TABS: 650.0000 mg | ORAL_TABLET | Freq: Four times a day (QID) | ORAL | Status: DC | PRN

## 2019-08-07 NOTE — Progress Notes (Signed)
Occupational Therapy Evaluation Only Patient Details Name: Philip Allen MRN: [id number] DOB: 1940-10-03 Today's Date: 08/07/2019    History of Present Illness 79 yo male with onset of sudden LE weakness and supported fall was noted to have susp orthostatic reaction vs TIA.  Imaging is negative for stroke, ongoing medical evaluation.  PMHx:  R shoulder sx, TURP, BPH, GERD, HTN,     Clinical Impression   PTA pt lived alone, independent in all ADL, IADL, and mobility tasks. Pt does not ambulate with an assistive device and reports 0 falls in the last 6 months. Pt still drives. Pt able to ambulate around room independently, noting 0 instances of LOB. Pt completed LB dressing, toileting, walk-in shower transfer, and hygiene tasks at the sink independently. Educated pt on safety strategies, fall prevention, and activity pacing to increase balance and prevent future falls with good understanding. All questions/concerns answered at this time. Pt appears to be functioning at/near baseline for self-care and functional transfer tasks. Skilled OT services not warranted at this time.      Follow Up Recommendations  No OT follow up    Equipment Recommendations  None recommended by OT    Recommendations for Other Services       Precautions / Restrictions Precautions Precautions: Fall Restrictions Weight Bearing Restrictions: No      Mobility Bed Mobility               General bed mobility comments: Pt seated EOB upon OT arrival  Transfers Overall transfer level: Independent Equipment used: None Transfers: Sit to/from Stand Sit to Stand: Independent         General transfer comment: No instances of LOB    Balance Overall balance assessment: No apparent balance deficits (not formally assessed)                                         ADL either performed or assessed with clinical judgement   ADL Overall ADL's : Independent                                        General ADL Comments: Pt completed self-care and functional transfer tasks indepedently. Noted 0 instances of LOB.      Vision Baseline Vision/History: Wears glasses Wears Glasses: Reading only       Perception     Praxis      Pertinent Vitals/Pain Pain Assessment: No/denies pain     Hand Dominance Right   Extremity/Trunk Assessment Upper Extremity Assessment Upper Extremity Assessment: Overall WFL for tasks assessed   Lower Extremity Assessment Lower Extremity Assessment: Defer to PT evaluation       Communication Communication Communication: No difficulties   Cognition Arousal/Alertness: Awake/alert Behavior During Therapy: WFL for tasks assessed/performed Overall Cognitive Status: Within Functional Limits for tasks assessed                                 General Comments: Pt pleasant and willing to participate in therapy. A&O x 4.    General Comments  No signs/symptoms of distress    Exercises     Shoulder Instructions      Home Living Family/patient expects to be discharged to:: Private residence Living Arrangements: Alone Available  Help at Discharge: Family;Available 24 hours/day(temporarily) Type of Home: House Home Access: Stairs to enter [employer] of Steps: 2 Entrance Stairs-Rails: Can reach both Home Layout: Two level;Able to live on main level with bedroom/bathroom     Bathroom Shower/Tub: Occupational psychologist: Standard     Home Equipment: Environmental consultant - 2 wheels;Walker - 4 wheels;Cane - single point;Grab bars - tub/shower;Shower seat;Wheelchair - manual;Toilet riser          Prior Functioning/Environment Level of Independence: Independent        Comments: Pt independent in all ADL, IADL, and mobility tasks. Pt ambulates without an assistive device. Pt reports 1 fall in the last 6 months. Pt still drives.         OT Problem List:        OT Treatment/Interventions:      OT  Goals(Current goals can be found in the care plan section)    OT Frequency:     Barriers to D/C:            Co-evaluation              AM-PAC OT "6 Clicks" Daily Activity     Outcome Measure Help from another person eating meals?: None Help from another person taking care of personal grooming?: None Help from another person toileting, which includes using toliet, bedpan, or urinal?: None Help from another person bathing (including washing, rinsing, drying)?: None Help from another person to put on and taking off regular upper body clothing?: None Help from another person to put on and taking off regular lower body clothing?: None 6 Click Score: 24   End of Session Equipment Utilized During Treatment: Gait belt Nurse Communication: Mobility status  Activity Tolerance: Patient tolerated treatment well Patient left: in bed;with nursing/sitter in room  OT Visit Diagnosis: Muscle weakness (generalized) (M62.81)                Time: [id number] OT Time Calculation (min): 20 min Charges:  OT General Charges $OT Visit: 1 Visit OT Evaluation $OT Eval Low Complexity: 1 Low  Mauri Brooklyn OTR/L [phone]  Mauri Brooklyn 08/07/2019, 9:20 AM

## 2019-08-30 ENCOUNTER — Ambulatory Visit (INDEPENDENT_AMBULATORY_CARE_PROVIDER_SITE_OTHER): Payer: Medicare Other

## 2019-08-30 ENCOUNTER — Encounter: Payer: Self-pay | Admitting: Cardiology

## 2019-08-30 ENCOUNTER — Ambulatory Visit (INDEPENDENT_AMBULATORY_CARE_PROVIDER_SITE_OTHER): Payer: Medicare Other | Admitting: Cardiology

## 2019-08-30 VITALS — BP 130/66 | HR 60 | Temp 98.7°F | Ht 67.0 in | Wt 179.0 lb

## 2019-08-30 DIAGNOSIS — Z87898 Personal history of other specified conditions: Secondary | ICD-10-CM

## 2019-08-30 DIAGNOSIS — R55 Syncope and collapse: Secondary | ICD-10-CM

## 2019-08-30 DIAGNOSIS — I10 Essential (primary) hypertension: Secondary | ICD-10-CM | POA: Diagnosis not present

## 2019-08-30 NOTE — Progress Notes (Signed)
Cardiology Office Note  Date: 08/30/2019   ID: SUHAAN JAQUES, DOB 1940/12/27, MRN [id number]  PCP:  Celene Squibb, MD  Cardiologist:  Rozann Lesches, MD Electrophysiologist:  None   Chief Complaint  Patient presents with  . History of syncope    History of Present Illness: Philip Allen is an 79 y.o. male referred after recent hospital evaluation, details of referral are not clear based on review of the chart, there is no discharge summary available.  He was admitted in early April after an episode of apparent syncope at home.  There was initial concern for possible stroke although neuroimaging showed no acute findings and he was observed thereafter.  He was not found to be orthostatic, echocardiogram reported LVEF 50 to 55% without major valvular abnormalities.  He states that he lives in his own home here in [place].  His wife passed away last summer.  He describes the event that occurred stating that he had gotten out of bed around 1:30 AM to check his door after his granddaughter came in from work.  He states that he felt fine, but when he was walking back to his bed his legs both suddenly felt weak and he apparently blacked out.  He states that this has not happened before, he did not recall any sense of palpitations or chest pain at the time.  He has had no further symptoms.   Past Medical History:  Diagnosis Date  . BPH (benign prostatic hypertrophy)   . GERD (gastroesophageal reflux disease)   . 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  . Hyperlipidemia   . Hypertension   . Urgency of urination     Past Surgical History:  Procedure Laterality Date  . RIGHT SHOULDER SURGERY  1998  . TRANSURETHRAL RESECTION OF PROSTATE  02/05/2012   Procedure: TRANSURETHRAL RESECTION OF THE PROSTATE WITH GYRUS INSTRUMENTS;  Surgeon: Ailene Rud, MD;  [facility]: [facility];  Service: Urology;  Laterality: N/A;  WITH GYRUS SUPRAPUBIC TUBE OWER TO  MAIN    Current Outpatient Medications  Medication Sig Dispense Refill  . acetaminophen (TYLENOL) 325 MG tablet Take 2 tablets (650 mg total) by mouth every 6 (six) hours as needed for mild pain (or Fever >/= 101).    [name] amLODipine (NORVASC) 10 MG tablet Take 10 mg by mouth daily.    [name] aspirin EC 81 MG tablet Take 81 mg by mouth daily.    [name] atorvastatin (LIPITOR) 40 MG tablet Take 40 mg by mouth daily.    . celecoxib (CELEBREX) 200 MG capsule Take 400 mg by mouth daily with breakfast.     . clopidogrel (PLAVIX) 75 MG tablet Take 75 mg by mouth daily.    [name] dexlansoprazole (DEXILANT) 60 MG capsule Take 60 mg by mouth daily.    . temazepam (RESTORIL) 30 MG capsule Take 30 mg by mouth at bedtime.     No current facility-administered medications for this visit.   Allergies:  Naproxen   Social History: The patient  reports that he has never smoked. He has never used smokeless tobacco. He reports current alcohol use. He reports that he does not use drugs.   Family History: The patient's family history includes Heart disease in his father and mother.   ROS:  Feet and hands feel cold.  Reports trouble with memory.  Physical Exam: VS:  BP 130/66   Pulse 60   Temp 98.7 F (37.1 C)  Ht 5\' 7"  (1.702 m)   Wt 179 lb (81.2 kg)   SpO2 98%   BMI 28.04 kg/m , BMI Body mass index is 28.04 kg/m.  Wt Readings from Last 3 Encounters:  08/30/19 179 lb (81.2 kg)  08/06/19 181 lb 14.4 oz (82.5 kg)  02/05/12 196 lb 15.7 oz (89.4 kg)    General: Elderly male, appears comfortable at rest. HEENT: Conjunctiva and lids normal, wearing a mask. Neck: Supple, no elevated JVP or carotid bruits, no thyromegaly. Lungs: Clear to auscultation, nonlabored breathing at rest. Cardiac: Regular rate and rhythm, no S3 or significant systolic murmur, no pericardial rub. Abdomen: Soft, bowel sounds present. Extremities: No pitting edema, distal pulses 2+. Skin: Warm and dry. Musculoskeletal: No  kyphosis. Neuropsychiatric: Alert and oriented x3, affect grossly appropriate.  ECG:  An ECG dated 08/06/2019 was personally reviewed today and demonstrated:  Sinus rhythm with PVC, borderline increased voltage.  Recent Labwork: 08/06/2019: ALT 28; AST 25; TSH 2.151 08/07/2019: BUN 12; Creatinine, Ser 1.04; Hemoglobin 15.4; Platelets 123; Potassium 4.1; Sodium 142   Other Studies Reviewed Today:  Echocardiogram 08/06/2019: 1. Left ventricular ejection fraction, by estimation, is 50 to 55%. The  left ventricle has low normal function. The left ventricle has no regional  wall motion abnormalities. There is moderate left ventricular hypertrophy.  Left ventricular diastolic  parameters are consistent with Grade I diastolic dysfunction (impaired  relaxation).  2. Right ventricular systolic function is normal. The right ventricular  size is normal.  3. The mitral valve is abnormal. Trivial mitral valve regurgitation.  4. The aortic valve is tricuspid. Aortic valve regurgitation is not  visualized. Mild aortic valve sclerosis is present, with no evidence of  aortic valve stenosis.   Brain MRI 08/06/2019: FINDINGS: Brain: No acute infarction, hemorrhage, hydrocephalus, extra-axial collection or mass lesion. Moderate remote right cerebellar infarct. Supratentorial chronic small vessel ischemic change is mild for age. Central predominant cerebral volume loss, typical for age. No chronic blood products.  Vascular: Normal flow voids. 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  Skull and upper cervical spine: Normal marrow signal  Sinuses/Orbits: Presumed retention cysts in the left maxillary sinus. Bilateral cataract resection.  IMPRESSION: 1. Remote right cerebellar infarct. 2. Otherwise age congruent appearance of the brain.  Assessment and Plan:  1.  Episode of apparent syncope while standing, described above.  This has not  been a recurring issue, no symptoms subsequent to hospital observation.  He was not found to be orthostatic, no evidence of acute stroke by neuroimaging, LVEF 50 to 55% by recent echocardiogram, ECG with sinus rhythm and single PVC.  Available cardiac monitor strips show some ventricular ectopy, no arrhythmias or pauses.  We will obtain a 7-day Zio patch for further investigation.  2.  Essential hypertension, currently on Norvasc with follow-up by Dr. Nevada Crane.  Systolic blood pressure [id number] today.  Medication Adjustments/Labs and Tests Ordered: Current medicines are reviewed at length with the patient today.  Concerns regarding medicines are outlined above.   Tests Ordered: Orders Placed This Encounter  Procedures  . Cardiac event monitor    Medication Changes: No orders of the defined types were placed in this encounter.   Disposition:  Follow up test results.  Signed, Satira Sark, MD, [facility] 08/30/2019 11:49 AM    [place] [facility]. [address], [place], [place] [address] Phone: [phone]; Fax: [phone]

## 2019-08-30 NOTE — Patient Instructions (Signed)
Medication Instructions: Your physician recommends that you continue on your current medications as directed. Please refer to the Current Medication list given to you today.   Labwork: None today  Procedures/Testing: Your physician has recommended that you wear an event monitor. Event monitors are medical devices that record the heart's electrical activity. Doctors most often [place] these monitors to diagnose arrhythmias. Arrhythmias are problems with the speed or rhythm of the heartbeat. The monitor is a small, portable device. You can wear one while you do your normal daily activities. This is usually used to diagnose what is causing palpitations/syncope (passing out).    Follow-Up: We will call you with results.  Any Additional Special Instructions Will Be Listed Below (If Applicable).     If you need a refill on your cardiac medications before your next appointment, please call your pharmacy.

## 2019-09-06 NOTE — Discharge Summary (Signed)
Physician Discharge Summary  Philip Allen [id number] DOB: [birth year] DOA: 08/06/2019  PCP: Celene Squibb, MD  Admit date: 08/06/2019 Discharge date: 08/07/2019  Time spent: 35 minutes  Recommendations for Outpatient Follow-up:  1. PCP Dr. [place] Crane in 1 week   Discharge Diagnoses:  Active Problems:   Syncope   Weakness   Depression   Discharge Condition:  stable  Diet recommendation: low-sodium  Filed Weights   08/06/19 0130  Weight: 82.5 kg    History of present illness:  79 year old male with history of BPH, TURP, hypertension, hepatitis B was brought to the emergency room following a syncopal event early this morning, he was transferred from [facility] ED for MRI. -Patient reports getting out of bed around 1 AM to go to the kitchen when he suddenly felt lightheaded, weak in both legs, followed by loss of consciousness, subsequently found by his granddaughter on the floor who started CPR on him. -He regained consciousness and was brought to the ED, reports both his legs being weak subsequently  Hospital Course:  Syncope -Etiology is unclear at this time - no events on telemetry, EKG with TWI in inferior leads -MRI was done this morning given concern for leg weakness, this is negative for acute findings, old cerebellar infarct noted - orthostatics negative, 2D echo noted preserved EF, no wall mot -He does have a few PACs,  TSH is normal - PT/ OT eval completed, no follow-up recommended - follow-up with PCP in 1 week, if has recurrent syncope would recommend event monitor to rule out conduction disorders  Depression Bereavement -Family reports that patient has been depressed since his wife's passing last summer -He denies any suicidal intent or ideation, had extensive discussion with the patient regarding this, he tells me that whenever his time comes he is at peace and does not want any extraordinary measures to be kept alive  -Conservation officer, nature, discussed with family  as well - follow-up with PCP and Psychiatry  Hypertension -Amlodipine  resume - orthostatics were  Memory, cognitive deficits - TSH and B12 were normal   Procedures:   2D echocardiogram, preserved EF, no wall motion abnormality  Discharge Exam: Vitals:   08/06/19 2100 08/06/19 2255  BP:  115/63  Pulse:  63  Resp:  20  Temp:  98.5 F (36.9 C)  SpO2: 98% 99%    General:  AAOx3 Cardiovascular:  S1-S2, regular rate rhy Respiratory:  clear  Discharge Instructions   Discharge Instructions    Diet - low sodium heart healthy   Complete by: As directed    Increase activity slowly   Complete by: As directed      Allergies as of 08/07/2019      Reactions   Naproxen Hives   Reaction to Aleve      Medication List    STOP taking these medications   POTASSIUM PO     TAKE these medications   acetaminophen 325 MG tablet Commonly known as: TYLENOL Take 2 tablets (650 mg total) by mouth every 6 (six) hours as needed for mild pain (or Fever >/= 101).   amLODipine 10 MG tablet Commonly known as: NORVASC Take 10 mg by mouth daily.   aspirin EC 81 MG tablet Take 81 mg by mouth daily.   atorvastatin 40 MG tablet Commonly known as: LIPITOR Take 40 mg by mouth daily.   celecoxib 200 MG capsule Commonly known as: CELEBREX Take 400 mg by mouth daily with breakfast.   clopidogrel 75 MG tablet Commonly  known as: PLAVIX Take 75 mg by mouth daily.   temazepam 30 MG capsule Commonly known as: RESTORIL Take 30 mg by mouth at bedtime.      Allergies  Allergen Reactions  . Naproxen Hives    Reaction to Aleve   Follow-up Information    PCP. Schedule an appointment as soon as possible for a visit in 2 week(s).        [facility].   Specialty: Cardiology Why: office will call you with FU Contact information: [address] [place] [phone]           The results of significant diagnostics from this  hospitalization (including imaging, microbiology, ancillary and laboratory) are listed below for reference.    Significant Diagnostic Studies: No results found.  Microbiology: No results found for this or any previous visit (from the past 240 hour(s)).   Labs: Basic Metabolic Panel: No results for input(s): NA, K, CL, CO2, GLUCOSE, BUN, CREATININE, CALCIUM, MG, PHOS in the last 168 hours. Liver Function Tests: No results for input(s): AST, ALT, ALKPHOS, BILITOT, PROT, ALBUMIN in the last 168 hours. No results for input(s): LIPASE, AMYLASE in the last 168 hours. No results for input(s): AMMONIA in the last 168 hours. CBC: No results for input(s): WBC, NEUTROABS, HGB, HCT, MCV, PLT in the last 168 hours. Cardiac Enzymes: No results for input(s): CKTOTAL, CKMB, CKMBINDEX, TROPONINI in the last 168 hours. BNP: BNP (last 3 results) No results for input(s): BNP in the last 8760 hours.  ProBNP (last 3 results) No results for input(s): PROBNP in the last 8760 hours.  CBG: No results for input(s): GLUCAP in the last 168 hours.     Signed:  Domenic Polite MD.  [facility] 09/06/2019, 2:48 PM

## 2019-09-20 ENCOUNTER — Other Ambulatory Visit: Payer: Self-pay

## 2019-09-20 DIAGNOSIS — Z87898 Personal history of other specified conditions: Secondary | ICD-10-CM | POA: Diagnosis not present

## 2020-02-01 DIAGNOSIS — K219 Gastro-esophageal reflux disease without esophagitis: Secondary | ICD-10-CM | POA: Diagnosis not present

## 2020-02-01 DIAGNOSIS — I10 Essential (primary) hypertension: Secondary | ICD-10-CM | POA: Diagnosis not present

## 2020-02-01 DIAGNOSIS — Z8673 Personal history of transient ischemic attack (TIA), and cerebral infarction without residual deficits: Secondary | ICD-10-CM | POA: Diagnosis not present

## 2020-02-01 DIAGNOSIS — E782 Mixed hyperlipidemia: Secondary | ICD-10-CM | POA: Diagnosis not present

## 2020-02-01 DIAGNOSIS — G4700 Insomnia, unspecified: Secondary | ICD-10-CM | POA: Diagnosis not present

## 2020-02-01 DIAGNOSIS — M545 Low back pain: Secondary | ICD-10-CM | POA: Diagnosis not present

## 2020-02-01 DIAGNOSIS — M25551 Pain in right hip: Secondary | ICD-10-CM | POA: Diagnosis not present

## 2020-02-01 DIAGNOSIS — E663 Overweight: Secondary | ICD-10-CM | POA: Diagnosis not present

## 2020-02-01 DIAGNOSIS — Z6829 Body mass index (BMI) 29.0-29.9, adult: Secondary | ICD-10-CM | POA: Diagnosis not present

## 2020-02-01 DIAGNOSIS — I639 Cerebral infarction, unspecified: Secondary | ICD-10-CM | POA: Diagnosis not present

## 2020-02-01 DIAGNOSIS — R3989 Other symptoms and signs involving the genitourinary system: Secondary | ICD-10-CM | POA: Diagnosis not present

## 2020-02-01 DIAGNOSIS — F321 Major depressive disorder, single episode, moderate: Secondary | ICD-10-CM | POA: Diagnosis not present

## 2020-02-01 DIAGNOSIS — Z0001 Encounter for general adult medical examination with abnormal findings: Secondary | ICD-10-CM | POA: Diagnosis not present

## 2020-02-01 DIAGNOSIS — Z6826 Body mass index (BMI) 26.0-26.9, adult: Secondary | ICD-10-CM | POA: Diagnosis not present

## 2020-02-01 DIAGNOSIS — D696 Thrombocytopenia, unspecified: Secondary | ICD-10-CM | POA: Diagnosis not present

## 2020-02-01 DIAGNOSIS — R7301 Impaired fasting glucose: Secondary | ICD-10-CM | POA: Diagnosis not present

## 2020-02-10 DIAGNOSIS — Z23 Encounter for immunization: Secondary | ICD-10-CM | POA: Diagnosis not present

## 2020-03-16 DIAGNOSIS — M25562 Pain in left knee: Secondary | ICD-10-CM | POA: Diagnosis not present

## 2020-07-06 DIAGNOSIS — Z6826 Body mass index (BMI) 26.0-26.9, adult: Secondary | ICD-10-CM | POA: Diagnosis not present

## 2020-07-06 DIAGNOSIS — K219 Gastro-esophageal reflux disease without esophagitis: Secondary | ICD-10-CM | POA: Diagnosis not present

## 2020-07-06 DIAGNOSIS — D696 Thrombocytopenia, unspecified: Secondary | ICD-10-CM | POA: Diagnosis not present

## 2020-07-06 DIAGNOSIS — E663 Overweight: Secondary | ICD-10-CM | POA: Diagnosis not present

## 2020-07-06 DIAGNOSIS — Z8673 Personal history of transient ischemic attack (TIA), and cerebral infarction without residual deficits: Secondary | ICD-10-CM | POA: Diagnosis not present

## 2020-07-06 DIAGNOSIS — R7301 Impaired fasting glucose: Secondary | ICD-10-CM | POA: Diagnosis not present

## 2020-07-06 DIAGNOSIS — G4700 Insomnia, unspecified: Secondary | ICD-10-CM | POA: Diagnosis not present

## 2020-07-06 DIAGNOSIS — I10 Essential (primary) hypertension: Secondary | ICD-10-CM | POA: Diagnosis not present

## 2020-07-06 DIAGNOSIS — E782 Mixed hyperlipidemia: Secondary | ICD-10-CM | POA: Diagnosis not present

## 2020-07-06 DIAGNOSIS — R079 Chest pain, unspecified: Secondary | ICD-10-CM | POA: Diagnosis not present

## 2020-07-06 DIAGNOSIS — F321 Major depressive disorder, single episode, moderate: Secondary | ICD-10-CM | POA: Diagnosis not present

## 2020-07-24 DIAGNOSIS — H353111 Nonexudative age-related macular degeneration, right eye, early dry stage: Secondary | ICD-10-CM | POA: Diagnosis not present

## 2020-07-24 DIAGNOSIS — H4720 Unspecified optic atrophy: Secondary | ICD-10-CM | POA: Diagnosis not present

## 2020-07-24 DIAGNOSIS — H5201 Hypermetropia, right eye: Secondary | ICD-10-CM | POA: Diagnosis not present

## 2020-07-24 DIAGNOSIS — H353122 Nonexudative age-related macular degeneration, left eye, intermediate dry stage: Secondary | ICD-10-CM | POA: Diagnosis not present

## 2020-07-24 DIAGNOSIS — H02831 Dermatochalasis of right upper eyelid: Secondary | ICD-10-CM | POA: Diagnosis not present

## 2020-07-24 DIAGNOSIS — H524 Presbyopia: Secondary | ICD-10-CM | POA: Diagnosis not present

## 2020-07-24 DIAGNOSIS — H26491 Other secondary cataract, right eye: Secondary | ICD-10-CM | POA: Diagnosis not present

## 2020-07-24 DIAGNOSIS — D3132 Benign neoplasm of left choroid: Secondary | ICD-10-CM | POA: Diagnosis not present

## 2020-07-25 DIAGNOSIS — I639 Cerebral infarction, unspecified: Secondary | ICD-10-CM | POA: Diagnosis not present

## 2020-07-25 DIAGNOSIS — D696 Thrombocytopenia, unspecified: Secondary | ICD-10-CM | POA: Diagnosis not present

## 2020-07-25 DIAGNOSIS — Z8673 Personal history of transient ischemic attack (TIA), and cerebral infarction without residual deficits: Secondary | ICD-10-CM | POA: Diagnosis not present

## 2020-07-25 DIAGNOSIS — F321 Major depressive disorder, single episode, moderate: Secondary | ICD-10-CM | POA: Diagnosis not present

## 2020-07-25 DIAGNOSIS — M25551 Pain in right hip: Secondary | ICD-10-CM | POA: Diagnosis not present

## 2020-07-25 DIAGNOSIS — R3989 Other symptoms and signs involving the genitourinary system: Secondary | ICD-10-CM | POA: Diagnosis not present

## 2020-07-25 DIAGNOSIS — Z136 Encounter for screening for cardiovascular disorders: Secondary | ICD-10-CM | POA: Diagnosis not present

## 2020-07-25 DIAGNOSIS — E663 Overweight: Secondary | ICD-10-CM | POA: Diagnosis not present

## 2020-07-25 DIAGNOSIS — R079 Chest pain, unspecified: Secondary | ICD-10-CM | POA: Diagnosis not present

## 2020-07-25 DIAGNOSIS — G4700 Insomnia, unspecified: Secondary | ICD-10-CM | POA: Diagnosis not present

## 2020-07-25 DIAGNOSIS — Z6829 Body mass index (BMI) 29.0-29.9, adult: Secondary | ICD-10-CM | POA: Diagnosis not present

## 2020-07-25 DIAGNOSIS — Z6826 Body mass index (BMI) 26.0-26.9, adult: Secondary | ICD-10-CM | POA: Diagnosis not present

## 2020-08-07 DIAGNOSIS — M25562 Pain in left knee: Secondary | ICD-10-CM | POA: Diagnosis not present

## 2020-08-08 DIAGNOSIS — D696 Thrombocytopenia, unspecified: Secondary | ICD-10-CM | POA: Diagnosis not present

## 2020-08-08 DIAGNOSIS — F321 Major depressive disorder, single episode, moderate: Secondary | ICD-10-CM | POA: Diagnosis not present

## 2020-08-08 DIAGNOSIS — R7301 Impaired fasting glucose: Secondary | ICD-10-CM | POA: Diagnosis not present

## 2020-08-08 DIAGNOSIS — K219 Gastro-esophageal reflux disease without esophagitis: Secondary | ICD-10-CM | POA: Diagnosis not present

## 2020-08-08 DIAGNOSIS — Z8673 Personal history of transient ischemic attack (TIA), and cerebral infarction without residual deficits: Secondary | ICD-10-CM | POA: Diagnosis not present

## 2020-08-08 DIAGNOSIS — I10 Essential (primary) hypertension: Secondary | ICD-10-CM | POA: Diagnosis not present

## 2020-08-08 DIAGNOSIS — G4700 Insomnia, unspecified: Secondary | ICD-10-CM | POA: Diagnosis not present

## 2020-08-08 DIAGNOSIS — Z6826 Body mass index (BMI) 26.0-26.9, adult: Secondary | ICD-10-CM | POA: Diagnosis not present

## 2020-08-08 DIAGNOSIS — E663 Overweight: Secondary | ICD-10-CM | POA: Diagnosis not present

## 2020-08-08 DIAGNOSIS — E782 Mixed hyperlipidemia: Secondary | ICD-10-CM | POA: Diagnosis not present

## 2020-08-11 DIAGNOSIS — M25562 Pain in left knee: Secondary | ICD-10-CM | POA: Diagnosis not present

## 2020-08-13 DIAGNOSIS — M25562 Pain in left knee: Secondary | ICD-10-CM | POA: Diagnosis not present

## 2020-08-29 NOTE — Progress Notes (Signed)
Cardiology Office Note  Date: 08/30/2020   ID: Philip Allen, DOB 02/11/1941, MRN [id number]  PCP:  Celene Squibb, MD  Cardiologist:  Rozann Lesches, MD Electrophysiologist:  None   Chief Complaint  Patient presents with  . Cardiac follow-up    History of Present Illness: Philip Allen is an 80 y.o. male last seen in April 2021.  He presents for a routine visit.  Reports no dizziness or syncope, no palpitations in the interim.  In addition, no exertional chest pain.  Echocardiogram and cardiac monitor results from last year are noted below.  He has continued to follow with Dr. Nevada Crane for primary care.  I personally reviewed his ECG today which shows sinus rhythm with Q in lead III, occasional PVCs.  He does mention that he has had trouble with erectile dysfunction.  Since his wife passed few years ago, he has made another acquaintance and was wondering whether he would be a candidate to take sildenafil.  He does not have any direct cardiac contraindication to try this, I encouraged him to talk with his PCP about getting a trial prescription and starting at a low dose first.  He is on antihypertensive therapy, does not use any nitroglycerin however.  Past Medical History:  Diagnosis Date  . BPH (benign prostatic hypertrophy)   . GERD (gastroesophageal reflux disease)   . 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  . Hyperlipidemia   . Hypertension   . Urgency of urination     Past Surgical History:  Procedure Laterality Date  . RIGHT SHOULDER SURGERY  1998  . TRANSURETHRAL RESECTION OF PROSTATE  02/05/2012   Procedure: TRANSURETHRAL RESECTION OF THE PROSTATE WITH GYRUS INSTRUMENTS;  Surgeon: Ailene Rud, MD;  [facility];  Service: Urology;  Laterality: N/A;  WITH GYRUS SUPRAPUBIC TUBE OWER TO MAIN    Current Outpatient Medications  Medication Sig Dispense Refill  . amLODipine (NORVASC) 10 MG tablet Take 10 mg by mouth daily.     [name] aspirin EC 81 MG tablet Take 81 mg by mouth daily.    [name] atorvastatin (LIPITOR) 40 MG tablet Take 40 mg by mouth daily.    . celecoxib (CELEBREX) 200 MG capsule Take 400 mg by mouth daily with breakfast.     . clopidogrel (PLAVIX) 75 MG tablet Take 75 mg by mouth daily.    [name] dexlansoprazole (DEXILANT) 60 MG capsule Take 60 mg by mouth daily.    . temazepam (RESTORIL) 30 MG capsule Take 30 mg by mouth at bedtime.     No current facility-administered medications for this visit.   Allergies:  Naproxen   ROS: No interval dizziness or syncope.  Physical Exam: VS:  BP (!) 142/72   Pulse 66   Ht 5\' 6"  (1.676 m)   Wt 182 lb 3.2 oz (82.6 kg)   SpO2 98%   BMI 29.41 kg/m , BMI Body mass index is 29.41 kg/m.  Wt Readings from Last 3 Encounters:  08/30/20 182 lb 3.2 oz (82.6 kg)  08/30/19 179 lb (81.2 kg)  08/06/19 181 lb 14.4 oz (82.5 kg)    General: Patient appears comfortable at rest. HEENT: Conjunctiva and lids normal, wearing a mask. Neck: Supple, no elevated JVP or carotid bruits, no thyromegaly. Lungs: Clear to auscultation, nonlabored breathing at rest. Cardiac: Regular rate and rhythm with occasional ectopy, no S3 or significant systolic murmur. Extremities: No pitting edema, distal pulses 2+.  ECG:  An ECG dated 08/06/2019 was personally reviewed today and demonstrated:  Sinus rhythm with PVC and nonspecific ST changes.  Recent Labwork:  No interval lab work for review today.  Other Studies Reviewed Today:  Echocardiogram 08/06/2019: 1. Left ventricular ejection fraction, by estimation, is 50 to 55%. The  left ventricle has low normal function. The left ventricle has no regional  wall motion abnormalities. There is moderate left ventricular hypertrophy.  Left ventricular diastolic  parameters are consistent with Grade I diastolic dysfunction (impaired  relaxation).  2. Right ventricular systolic function is normal. The right ventricular  size is normal.  3. The  mitral valve is abnormal. Trivial mitral valve regurgitation.  4. The aortic valve is tricuspid. Aortic valve regurgitation is not  visualized. Mild aortic valve sclerosis is present, with no evidence of  aortic valve stenosis.   Cardiac monitor 09/20/2019: ZIO XT reviewed.  5 days 23 hours analyzed.  Predominant rhythm is sinus with heart rate ranging from 47 bpm up to 100 bpm and average heart rate 62 bpm.  There were occasional PACs representing 2.5% of total beats and occasional PVCs representing 3% of total beats.  Also relatively brief episodes of SVT, the longest of which lasted for 14 beats.  No sustained arrhythmias or pauses.  Assessment and Plan:  1.  Prior history of syncope, no recurrence and clinically stable since last encounter.  Echocardiogram and cardiac monitor are noted above.  LVEF 50 to 55% and ectopy noted in the form of PACs and PVCs without any sustained arrhythmias.  PVCs are upright in the inferior leads and likely benign.  At this point no further work-up planned in the absence of any recurrent symptoms.  2.  Essential hypertension, systolic in the 458K today.  He is on Norvasc with follow-up by Dr. [place] Crane.  3.  Erectile dysfunction as discussed above.  No direct cardiac contraindication for him to try sildenafil, he plans to talk with his PCP about getting a trial prescription.  He is not on nitroglycerin.  Blood pressure stable on Norvasc.  I did mention that he should try the lowest dose first.  Medication Adjustments/Labs and Tests Ordered: Current medicines are reviewed at length with the patient today.  Concerns regarding medicines are outlined above.   Tests Ordered: No orders of the defined types were placed in this encounter.   Medication Changes: No orders of the defined types were placed in this encounter.   Disposition:  Follow up 1 year.  Signed, Satira Sark, MD, [facility] 08/30/2020 9:05 AM    [facility] [address] [place], [place] [address] Phone: [phone]; Fax: [phone]

## 2020-08-30 ENCOUNTER — Encounter: Payer: Self-pay | Admitting: Cardiology

## 2020-08-30 ENCOUNTER — Other Ambulatory Visit: Payer: Self-pay

## 2020-08-30 ENCOUNTER — Ambulatory Visit (INDEPENDENT_AMBULATORY_CARE_PROVIDER_SITE_OTHER): Payer: Medicare Other | Admitting: Cardiology

## 2020-08-30 VITALS — BP 142/72 | HR 66 | Ht 66.0 in | Wt 182.2 lb

## 2020-08-30 DIAGNOSIS — Z87898 Personal history of other specified conditions: Secondary | ICD-10-CM | POA: Diagnosis not present

## 2020-08-30 DIAGNOSIS — N529 Male erectile dysfunction, unspecified: Secondary | ICD-10-CM | POA: Diagnosis not present

## 2020-08-30 DIAGNOSIS — I10 Essential (primary) hypertension: Secondary | ICD-10-CM

## 2020-08-30 NOTE — Patient Instructions (Addendum)
Medication Instructions:   Your physician recommends that you continue on your current medications as directed. Please refer to the Current Medication list given to you today.  *If you need a refill on your cardiac medications before your next appointment, please call your pharmacy*   Lab Work: None today  If you have labs (blood work) drawn today and your tests are completely normal, you will receive your results only by: . MyChart Message (if you have MyChart) OR . A paper copy in the mail If you have any lab test that is abnormal or we need to change your treatment, we will call you to review the results.   Testing/Procedures: None today   Follow-Up: At [facility], you and your health needs are our priority.  As part of our continuing mission to provide you with exceptional heart care, we have created designated Provider Care Teams.  These Care Teams include your primary Cardiologist (physician) and Advanced Practice Providers (APPs -  Physician Assistants and Nurse Practitioners) who all work together to provide you with the care you need, when you need it.  We recommend signing up for the patient portal called "MyChart".  Sign up information is provided on this After Visit Summary.  MyChart is used to connect with patients for Virtual Visits (Telemedicine).  Patients are able to view lab/test results, encounter notes, upcoming appointments, etc.  Non-urgent messages can be sent to your provider as well.   To learn more about what you can do with MyChart, go to [email or web address].com.    Your next appointment:   12 month(s)  The format for your next appointment:   In Person  Provider:   Samuel McDowell, MD   Other Instructions None   

## 2020-08-30 NOTE — Addendum Note (Signed)
Addended by: Levonne Hubert on: 08/30/2020 12:18 PM   Modules accepted: Orders

## 2020-10-23 DIAGNOSIS — Z23 Encounter for immunization: Secondary | ICD-10-CM | POA: Diagnosis not present

## 2020-12-06 DIAGNOSIS — U071 COVID-19: Secondary | ICD-10-CM | POA: Diagnosis not present

## 2020-12-06 DIAGNOSIS — Z20822 Contact with and (suspected) exposure to covid-19: Secondary | ICD-10-CM | POA: Diagnosis not present

## 2020-12-14 DIAGNOSIS — U071 COVID-19: Secondary | ICD-10-CM | POA: Diagnosis not present

## 2020-12-14 DIAGNOSIS — R059 Cough, unspecified: Secondary | ICD-10-CM | POA: Diagnosis not present

## 2021-01-28 DIAGNOSIS — X32XXXD Exposure to sunlight, subsequent encounter: Secondary | ICD-10-CM | POA: Diagnosis not present

## 2021-01-28 DIAGNOSIS — L570 Actinic keratosis: Secondary | ICD-10-CM | POA: Diagnosis not present

## 2021-02-13 DIAGNOSIS — I10 Essential (primary) hypertension: Secondary | ICD-10-CM | POA: Diagnosis not present

## 2021-02-13 DIAGNOSIS — E663 Overweight: Secondary | ICD-10-CM | POA: Diagnosis not present

## 2021-02-13 DIAGNOSIS — E782 Mixed hyperlipidemia: Secondary | ICD-10-CM | POA: Diagnosis not present

## 2021-02-13 DIAGNOSIS — G4700 Insomnia, unspecified: Secondary | ICD-10-CM | POA: Diagnosis not present

## 2021-02-13 DIAGNOSIS — F321 Major depressive disorder, single episode, moderate: Secondary | ICD-10-CM | POA: Diagnosis not present

## 2021-02-13 DIAGNOSIS — K219 Gastro-esophageal reflux disease without esophagitis: Secondary | ICD-10-CM | POA: Diagnosis not present

## 2021-02-13 DIAGNOSIS — Z8673 Personal history of transient ischemic attack (TIA), and cerebral infarction without residual deficits: Secondary | ICD-10-CM | POA: Diagnosis not present

## 2021-02-13 DIAGNOSIS — D696 Thrombocytopenia, unspecified: Secondary | ICD-10-CM | POA: Diagnosis not present

## 2021-02-13 DIAGNOSIS — Z23 Encounter for immunization: Secondary | ICD-10-CM | POA: Diagnosis not present

## 2021-02-13 DIAGNOSIS — Z0001 Encounter for general adult medical examination with abnormal findings: Secondary | ICD-10-CM | POA: Diagnosis not present

## 2021-02-13 DIAGNOSIS — R079 Chest pain, unspecified: Secondary | ICD-10-CM | POA: Diagnosis not present

## 2021-02-13 DIAGNOSIS — R7301 Impaired fasting glucose: Secondary | ICD-10-CM | POA: Diagnosis not present

## 2021-03-05 DIAGNOSIS — Z23 Encounter for immunization: Secondary | ICD-10-CM | POA: Diagnosis not present

## 2021-05-02 DIAGNOSIS — Z23 Encounter for immunization: Secondary | ICD-10-CM | POA: Diagnosis not present

## 2021-05-17 DIAGNOSIS — R079 Chest pain, unspecified: Secondary | ICD-10-CM | POA: Diagnosis not present

## 2021-05-17 DIAGNOSIS — D696 Thrombocytopenia, unspecified: Secondary | ICD-10-CM | POA: Diagnosis not present

## 2021-05-17 DIAGNOSIS — G4700 Insomnia, unspecified: Secondary | ICD-10-CM | POA: Diagnosis not present

## 2021-05-17 DIAGNOSIS — M25562 Pain in left knee: Secondary | ICD-10-CM | POA: Diagnosis not present

## 2021-05-17 DIAGNOSIS — E663 Overweight: Secondary | ICD-10-CM | POA: Diagnosis not present

## 2021-05-17 DIAGNOSIS — R413 Other amnesia: Secondary | ICD-10-CM | POA: Diagnosis not present

## 2021-05-17 DIAGNOSIS — F321 Major depressive disorder, single episode, moderate: Secondary | ICD-10-CM | POA: Diagnosis not present

## 2021-05-17 DIAGNOSIS — E782 Mixed hyperlipidemia: Secondary | ICD-10-CM | POA: Diagnosis not present

## 2021-05-17 DIAGNOSIS — Z8673 Personal history of transient ischemic attack (TIA), and cerebral infarction without residual deficits: Secondary | ICD-10-CM | POA: Diagnosis not present

## 2021-05-17 DIAGNOSIS — R7301 Impaired fasting glucose: Secondary | ICD-10-CM | POA: Diagnosis not present

## 2021-05-17 DIAGNOSIS — I10 Essential (primary) hypertension: Secondary | ICD-10-CM | POA: Diagnosis not present

## 2021-05-17 DIAGNOSIS — K219 Gastro-esophageal reflux disease without esophagitis: Secondary | ICD-10-CM | POA: Diagnosis not present

## 2021-07-22 DIAGNOSIS — M545 Low back pain, unspecified: Secondary | ICD-10-CM | POA: Diagnosis not present

## 2021-07-22 DIAGNOSIS — M25551 Pain in right hip: Secondary | ICD-10-CM | POA: Diagnosis not present

## 2021-08-05 DIAGNOSIS — M25551 Pain in right hip: Secondary | ICD-10-CM | POA: Diagnosis not present

## 2021-08-06 DIAGNOSIS — M6281 Muscle weakness (generalized): Secondary | ICD-10-CM | POA: Diagnosis not present

## 2021-08-06 DIAGNOSIS — M48061 Spinal stenosis, lumbar region without neurogenic claudication: Secondary | ICD-10-CM | POA: Diagnosis not present

## 2021-08-14 DIAGNOSIS — M48061 Spinal stenosis, lumbar region without neurogenic claudication: Secondary | ICD-10-CM | POA: Diagnosis not present

## 2021-08-14 DIAGNOSIS — M6281 Muscle weakness (generalized): Secondary | ICD-10-CM | POA: Diagnosis not present

## 2021-08-20 DIAGNOSIS — H353122 Nonexudative age-related macular degeneration, left eye, intermediate dry stage: Secondary | ICD-10-CM | POA: Diagnosis not present

## 2021-08-20 DIAGNOSIS — H02831 Dermatochalasis of right upper eyelid: Secondary | ICD-10-CM | POA: Diagnosis not present

## 2021-08-20 DIAGNOSIS — Z961 Presence of intraocular lens: Secondary | ICD-10-CM | POA: Diagnosis not present

## 2021-08-20 DIAGNOSIS — H43813 Vitreous degeneration, bilateral: Secondary | ICD-10-CM | POA: Diagnosis not present

## 2021-08-20 DIAGNOSIS — H53452 Other localized visual field defect, left eye: Secondary | ICD-10-CM | POA: Diagnosis not present

## 2021-08-21 DIAGNOSIS — M6281 Muscle weakness (generalized): Secondary | ICD-10-CM | POA: Diagnosis not present

## 2021-08-21 DIAGNOSIS — M48061 Spinal stenosis, lumbar region without neurogenic claudication: Secondary | ICD-10-CM | POA: Diagnosis not present

## 2021-09-13 DIAGNOSIS — E782 Mixed hyperlipidemia: Secondary | ICD-10-CM | POA: Diagnosis not present

## 2021-09-13 DIAGNOSIS — R7301 Impaired fasting glucose: Secondary | ICD-10-CM | POA: Diagnosis not present

## 2021-09-19 DIAGNOSIS — E782 Mixed hyperlipidemia: Secondary | ICD-10-CM | POA: Diagnosis not present

## 2021-09-19 DIAGNOSIS — R413 Other amnesia: Secondary | ICD-10-CM | POA: Diagnosis not present

## 2021-09-19 DIAGNOSIS — K219 Gastro-esophageal reflux disease without esophagitis: Secondary | ICD-10-CM | POA: Diagnosis not present

## 2021-09-19 DIAGNOSIS — G4700 Insomnia, unspecified: Secondary | ICD-10-CM | POA: Diagnosis not present

## 2021-09-19 DIAGNOSIS — D696 Thrombocytopenia, unspecified: Secondary | ICD-10-CM | POA: Diagnosis not present

## 2021-09-19 DIAGNOSIS — F321 Major depressive disorder, single episode, moderate: Secondary | ICD-10-CM | POA: Diagnosis not present

## 2021-09-19 DIAGNOSIS — M25562 Pain in left knee: Secondary | ICD-10-CM | POA: Diagnosis not present

## 2021-09-19 DIAGNOSIS — Z8673 Personal history of transient ischemic attack (TIA), and cerebral infarction without residual deficits: Secondary | ICD-10-CM | POA: Diagnosis not present

## 2021-09-19 DIAGNOSIS — E663 Overweight: Secondary | ICD-10-CM | POA: Diagnosis not present

## 2021-09-19 DIAGNOSIS — R7301 Impaired fasting glucose: Secondary | ICD-10-CM | POA: Diagnosis not present

## 2021-09-19 DIAGNOSIS — R079 Chest pain, unspecified: Secondary | ICD-10-CM | POA: Diagnosis not present

## 2021-09-19 DIAGNOSIS — I10 Essential (primary) hypertension: Secondary | ICD-10-CM | POA: Diagnosis not present

## 2021-10-03 DIAGNOSIS — H02413 Mechanical ptosis of bilateral eyelids: Secondary | ICD-10-CM | POA: Diagnosis not present

## 2021-10-03 DIAGNOSIS — H02834 Dermatochalasis of left upper eyelid: Secondary | ICD-10-CM | POA: Diagnosis not present

## 2021-10-03 DIAGNOSIS — H0279 Other degenerative disorders of eyelid and periocular area: Secondary | ICD-10-CM | POA: Diagnosis not present

## 2021-10-03 DIAGNOSIS — H02831 Dermatochalasis of right upper eyelid: Secondary | ICD-10-CM | POA: Diagnosis not present

## 2021-10-03 DIAGNOSIS — H57813 Brow ptosis, bilateral: Secondary | ICD-10-CM | POA: Diagnosis not present

## 2021-10-05 ENCOUNTER — Encounter (HOSPITAL_COMMUNITY): Payer: Self-pay | Admitting: Emergency Medicine

## 2021-10-05 ENCOUNTER — Emergency Department (HOSPITAL_COMMUNITY): Payer: Medicare Other

## 2021-10-05 ENCOUNTER — Other Ambulatory Visit: Payer: Self-pay

## 2021-10-05 ENCOUNTER — Emergency Department (HOSPITAL_COMMUNITY)
Admission: EM | Admit: 2021-10-05 | Discharge: 2021-10-05 | Disposition: A | Discharge status: Home / Self Care | Payer: Medicare Other | Attending: Emergency Medicine | Admitting: Emergency Medicine

## 2021-10-05 DIAGNOSIS — R0789 Other chest pain: Secondary | ICD-10-CM | POA: Insufficient documentation

## 2021-10-05 DIAGNOSIS — I10 Essential (primary) hypertension: Secondary | ICD-10-CM | POA: Insufficient documentation

## 2021-10-05 DIAGNOSIS — R079 Chest pain, unspecified: Secondary | ICD-10-CM

## 2021-10-05 DIAGNOSIS — R001 Bradycardia, unspecified: Secondary | ICD-10-CM | POA: Diagnosis not present

## 2021-10-05 DIAGNOSIS — R0602 Shortness of breath: Secondary | ICD-10-CM | POA: Diagnosis not present

## 2021-10-05 DIAGNOSIS — Z7902 Long term (current) use of antithrombotics/antiplatelets: Secondary | ICD-10-CM | POA: Diagnosis not present

## 2021-10-05 DIAGNOSIS — Z8673 Personal history of transient ischemic attack (TIA), and cerebral infarction without residual deficits: Secondary | ICD-10-CM | POA: Insufficient documentation

## 2021-10-05 DIAGNOSIS — Z79899 Other long term (current) drug therapy: Secondary | ICD-10-CM | POA: Diagnosis not present

## 2021-10-05 DIAGNOSIS — I491 Atrial premature depolarization: Secondary | ICD-10-CM | POA: Diagnosis not present

## 2021-10-05 DIAGNOSIS — Z7982 Long term (current) use of aspirin: Secondary | ICD-10-CM | POA: Diagnosis not present

## 2021-10-05 LAB — CBC WITH DIFFERENTIAL/PLATELET
Abs Immature Granulocytes: 0.03 10*3/uL (ref 0.00–0.07)
Basophils Absolute: 0 10*3/uL (ref 0.0–0.1)
Basophils Relative: 0 %
Eosinophils Absolute: 0.2 10*3/uL (ref 0.0–0.5)
Eosinophils Relative: 4 %
HCT: 42.5 % (ref 39.0–52.0)
Hemoglobin: 14.4 g/dL (ref 13.0–17.0)
Immature Granulocytes: 1 %
Lymphocytes Relative: 23 %
Lymphs Abs: 1.3 10*3/uL (ref 0.7–4.0)
MCH: 32.6 pg (ref 26.0–34.0)
MCHC: 33.9 g/dL (ref 30.0–36.0)
MCV: 96.2 fL (ref 80.0–100.0)
Monocytes Absolute: 0.5 10*3/uL (ref 0.1–1.0)
Monocytes Relative: 8 %
Neutro Abs: 3.7 10*3/uL (ref 1.7–7.7)
Neutrophils Relative %: 64 %
Platelets: 116 10*3/uL — ABNORMAL LOW (ref 150–400)
RBC: 4.42 MIL/uL (ref 4.22–5.81)
RDW: 12.2 % (ref 11.5–15.5)
WBC: 5.8 10*3/uL (ref 4.0–10.5)
nRBC: 0 % (ref 0.0–0.2)

## 2021-10-05 LAB — TROPONIN I (HIGH SENSITIVITY)
Troponin I (High Sensitivity): 9 ng/L (ref <18)
Troponin I (High Sensitivity): 10 ng/L (ref <18)

## 2021-10-05 LAB — BASIC METABOLIC PANEL
Anion gap: 9 (ref 5–15)
BUN: 21 mg/dL (ref 8–23)
CO2: 23 mmol/L (ref 22–32)
Calcium: 9.2 mg/dL (ref 8.9–10.3)
Chloride: 107 mmol/L (ref 98–111)
Creatinine, Ser: 1.02 mg/dL (ref 0.61–1.24)
GFR, Estimated: >60 mL/min (ref >60)
Glucose, Bld: 105 mg/dL — ABNORMAL HIGH (ref 70–99)
Potassium: 3.6 mmol/L (ref 3.5–5.1)
Sodium: 139 mmol/L (ref 135–145)

## 2021-10-05 NOTE — ED Triage Notes (Signed)
Pt BIB [facility] from home, c/o sudden onset central chest pain and weakness at rest. On EMS arrival, chest pain had resolved but pt continued to feel weak. EMS noted pt to be bradycardic with frequent PVCs. Given '324mg'$  asa, EMS BP 184/90.

## 2021-10-05 NOTE — ED Provider Notes (Signed)
[facility] EM[facility]Y DE[facility]MENT Provider Note   CSN: [id number] Arrival date & time: 10/05/21  1935     History  Chief Complaint  Patient presents with   Chest Pain    Philip Allen is a 81 y.o. male.  HPI 81 year old male with history of hypertension and hyperlipidemia as well as a prior history of stroke presents with chest pain.  Around 6:30 PM he developed chest heaviness in the center of his chest.  He was short of breath.  He denies diaphoresis or radiation of the pain.  No back pain.  His wife came to help him and he noted that he was so weak in his legs that he had to lay himself down on the ground.  Never passed out.  Overall he thinks this estimated 10 minutes or so.  Wife gave him 4 baby aspirin to chew and his symptoms resolved after the 10 minutes.  He has not had any recurrence.  He has never had known CAD.  No leg swelling.  Home Medications Prior to Admission medications   Medication Sig Start Date End Date Taking? Authorizing Provider  amLODipine (NORVASC) 10 MG tablet Take 10 mg by mouth daily.   Yes [provider]  aspirin EC 81 MG tablet Take 81 mg by mouth daily.   Yes [provider]  atorvastatin (LIPITOR) 40 MG tablet Take 40 mg by mouth daily.   Yes [provider]  celecoxib (CELEBREX) 200 MG capsule Take 400 mg by mouth daily with breakfast.    Yes [provider]  clopidogrel (PLAVIX) 75 MG tablet Take 75 mg by mouth daily. 07/31/19  Yes [provider]  memantine (NAMENDA) 10 MG tablet Take 10 mg by mouth in the morning and at bedtime.   Yes [provider]  pantoprazole (PROTONIX) 40 MG tablet Take 40 mg by mouth daily before breakfast.   Yes [provider]  temazepam (RESTORIL) 30 MG capsule Take 30 mg by mouth at bedtime as needed for sleep.   Yes [provider]      Allergies    Naproxen    Review of Systems   Review of Systems  Constitutional:   Negative for diaphoresis.  Respiratory:  Positive for shortness of breath.   Cardiovascular:  Positive for chest pain. Negative for leg swelling.  Gastrointestinal:  Negative for abdominal pain.  Musculoskeletal:  Negative for back pain.   Physical Exam Updated Vital Signs BP 137/79   Pulse (!) 50   Temp 98.2 F (36.8 C) (Oral)   Resp 10   SpO2 94%  Physical Exam Vitals and nursing note reviewed.  Constitutional:      Appearance: He is well-developed.  HENT:     Head: Normocephalic and atraumatic.  Cardiovascular:     Rate and Rhythm: Normal rate and regular rhythm.     Pulses:          Radial pulses are 2+ on the right side and 2+ on the left side.       Dorsalis pedis pulses are 2+ on the right side and 2+ on the left side.     Heart sounds: Normal heart sounds.  Pulmonary:     Effort: Pulmonary effort is normal.     Breath sounds: Normal breath sounds.  Chest:     Chest wall: No tenderness.  Abdominal:     Palpations: Abdomen is soft.     Tenderness: There is no abdominal tenderness.  Skin:  General: Skin is warm and dry.  Neurological:     Mental Status: He is alert.    ED Results / Procedures / Treatments   Labs (all labs ordered are listed, but only abnormal results are displayed) Labs Reviewed  BASIC METABOLIC PANEL - Abnormal; Notable for the following components:      Result Value   Glucose, Bld 105 (*)    All other components within normal limits  CBC WITH DIFFERENTIAL/PLATELET - Abnormal; Notable for the following components:   Platelets 116 (*)    All other components within normal limits  TROPONIN I (HIGH SENSITIVITY)  TROPONIN I (HIGH SENSITIVITY)    EKG EKG Interpretation  Date/Time:  Saturday October 05 2021 19:38:58 EDT Ventricular Rate:  54 PR Interval:  182 QRS Duration: 112 QT Interval:  453 QTC Calculation: 430 R Axis:   42 Text Interpretation: Sinus rhythm Borderline intraventricular conduction delay overall similar to April 2021  Confirmed by Philip Allen [phone]) on 10/05/2021 7:50:26 PM  Radiology DG Chest 2 View  Result Date: 10/05/2021 CLINICAL DATA:  chest pain EXAM: CHEST - 2 VIEW COMPARISON:  October 24, 2015 FINDINGS: The cardiomediastinal silhouette is unchanged in contour.Atherosclerotic calcifications. No pleural effusion. No pneumothorax. No acute pleuroparenchymal abnormality. Visualized abdomen is unremarkable. Multilevel degenerative changes of the thoracic spine. IMPRESSION: No acute cardiopulmonary abnormality. Electronically Signed   By: Valentino Saxon M.D.   On: 10/05/2021 20:29    Procedures Procedures    Medications Ordered in ED Medications - No data to display  ED Course/ Medical Decision Making/ A&P                           Medical Decision Making Amount and/or Complexity of Data Reviewed External Data Reviewed: notes. Labs: ordered. Radiology: ordered and independent interpretation performed. ECG/medicine tests: ordered and independent interpretation performed.   Patient presents with about 10 minutes of sudden chest pressure.  Symptoms spontaneously resolved or resolved with aspirin.  ECG with no acute ischemia on my interpretation.  Chest x-ray images viewed by myself and no edema or pneumonia or pneumothorax.  Labs are benign including 2 negative troponins.  I discussed that with his age and risk factors will be reasonable to admit him for observation and further cardiac work-up.  However he declines and strongly wants to go home.  I do not think this is unreasonable but I think it would be good for him to have a cardiology referral which I have ordered as well as see his PCP.  We discussed that while he has not had an MI tonight, this does not rule out cardiac disease and he needs to return if he develops any new or recurrent symptoms.        Final Clinical Impression(s) / ED Diagnoses Final diagnoses:  Nonspecific chest pain    Rx / DC Orders ED Discharge Orders           Ordered    Ambulatory referral to Cardiology       Comments: If you have not heard from the Cardiology office within the next 72 hours please call [phone].   10/05/21 9292              Philip Gambler, MD 10/05/21 2341

## 2021-10-05 NOTE — Discharge Instructions (Signed)
If you develop recurrent, continued, or worsening chest pain, shortness of breath, fever, vomiting, abdominal or back pain, or any other new/concerning symptoms then return to the ER for evaluation.  

## 2021-10-05 NOTE — ED Notes (Signed)
E-signature pad unavailable at time of pt discharge. This RN discussed discharge materials with pt and answered all pt questions. Pt stated understanding of discharge material. ? ?

## 2021-10-07 NOTE — Progress Notes (Unsigned)
Cardiology Clinic Note   Patient Name: Philip Allen Date of Encounter: 10/08/2021  Primary Care Provider:  Celene Squibb, MD Primary Cardiologist:  Rozann Lesches, MD  Patient Profile    81 year old male with known history of syncope, hypertension, hyperlipidemia, CVA, and erectile dysfunction followed by Dr. Domenic Polite.  Has not been seen since April 2022.  He has been followed by his primary care provider, Dr. [place] Crane.  He was seen in the emergency room on 10/05/2021 for complaints of chest heaviness located in the center of his chest with shortness of breath.  He felt weakness and was unable to stand so he laid down on the ground but never lost consciousness.    He reported that it lasted approximately 10 minutes.  His wife provided him 4 baby aspirin and symptoms resolved after 10 minutes however he was seen in the ED for further evaluation.  EKG did not show evidence of ACS, it revealed sinus rhythm with borderline intraventricular conduction delay similar to prior EKG in 2021.  He declined admission, and he was found to have 2 negative cardiac enzymes during observation in ED.  Past Medical History    Past Medical History:  Diagnosis Date   BPH (benign prostatic hypertrophy)    GERD (gastroesophageal reflux disease)    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   Hyperlipidemia    Hypertension    Urgency of urination    Past Surgical History:  Procedure Laterality Date   RIGHT SHOULDER SURGERY  1998   TRANSURETHRAL RESECTION OF PROSTATE  02/05/2012   Procedure: TRANSURETHRAL RESECTION OF THE PROSTATE WITH GYRUS INSTRUMENTS;  Surgeon: Ailene Rud, MD;  Location: [facility];  Service: Urology;  Laterality: N/A;  WITH GYRUS SUPRAPUBIC TUBE OWER TO MAIN    Allergies  Allergies  Allergen Reactions   Naproxen Hives and Other (See Comments)    Reaction to Aleve    History of Present Illness    [name] presents today post ED visit on 10/05/2021  for complaints of chest pressure.  He was seen in the ED and ruled out for ACS by troponin and EKG.  His prior history includes CVA, hyperlipidemia, hypertension, and syncopal episodes.  He has not been seen by cardiology since April 2022 where he is followed by Dr. Domenic Polite.  He comes today with his fiance, they are getting married in 5 weeks, with continued concerns of recurrent discomfort in his chest.  He is concerned that there are issues with coronary artery disease which is causing some anxiety.  He denies any rapid palpitations, he has been having a mild dyspnea on exertion, but has not been as active as he had been in the past.  He describes a loss of his wife after 23 years with a "hard 2 years" before meeting his fiance who has "changed his life.".  He is medically compliant, blood pressure has been under control at home.  He does complain of frequent leg cramps.  Home Medications    Current Outpatient Medications  Medication Sig Dispense Refill   amLODipine (NORVASC) 10 MG tablet Take 10 mg by mouth daily.     aspirin EC 81 MG tablet Take 81 mg by mouth daily.     atorvastatin (LIPITOR) 40 MG tablet Take 40 mg by mouth daily.     celecoxib (CELEBREX) 200 MG capsule Take 400 mg by mouth daily with breakfast.      clopidogrel (PLAVIX) 75 MG tablet  Take 75 mg by mouth daily.     Magnesium Glycinate 100 MG CAPS Take 100 mg by mouth daily. 60 capsule 2   memantine (NAMENDA) 10 MG tablet Take 10 mg by mouth in the morning and at bedtime.     metoprolol tartrate (LOPRESSOR) 50 MG tablet Take 1 tablet (50 mg total) by mouth 2 (two) times daily. 1 tablet 0   pantoprazole (PROTONIX) 40 MG tablet Take 40 mg by mouth daily before breakfast.     temazepam (RESTORIL) 30 MG capsule Take 30 mg by mouth at bedtime as needed for sleep.     No current facility-administered medications for this visit.     Family History    Family History  Problem Relation Age of Onset   Heart disease Mother     Heart disease Father    He indicated that the status of his mother is unknown. He indicated that the status of his father is unknown.  Social History    Social History   Socioeconomic History   Marital status: Married    Spouse name: Not on file   Number of children: Not on file   Years of education: Not on file   Highest education level: Not on file  Occupational History   Not on file  Tobacco Use   Smoking status: Never   Smokeless tobacco: Never  Vaping Use   Vaping Use: Never used  Substance and Sexual Activity   Alcohol use: Yes    Comment: OCCASIONAL    Drug use: No   Sexual activity: Not on file  Other Topics Concern   Not on file  Social History Narrative   Not on file   Social Determinants of Health   Financial Resource Strain: Not on file  Food Insecurity: Not on file  Transportation Needs: Not on file  Physical Activity: Not on file  Stress: Not on file  Social Connections: Not on file  Intimate Partner Violence: Not on file     Review of Systems    General:  No chills, fever, night sweats or weight changes.  Complains of fatigue Cardiovascular: Positive for chest pain, positive for dyspnea on exertion, edema, orthopnea, palpitations, paroxysmal nocturnal dyspnea. Dermatological: No rash, lesions/masses Respiratory: No cough, dyspnea Urologic: No hematuria, dysuria Abdominal:   No nausea, vomiting, diarrhea, bright red blood per rectum, melena, or hematemesis Neurologic:  No visual changes, wkns, changes in mental status. All other systems reviewed and are otherwise negative except as noted above.     Physical Exam    VS:  BP 116/60   Pulse 63   Ht '5\' 6"'$  (1.676 m)   Wt 186 lb (84.4 kg)   SpO2 94%   BMI 30.02 kg/m  , BMI Body mass index is 30.02 kg/m.     GEN: Well nourished, well developed, in no acute distress. HEENT: normal. Neck: Supple, no JVD, carotid bruits, or masses. Cardiac: RRR, no murmurs, rubs, or gallops. No clubbing,  cyanosis, edema.  Radials/DP/PT 2+ and equal bilaterally.  Respiratory:  Respirations regular and unlabored, clear to auscultation bilaterally. GI: Soft, nontender, nondistended, BS + x 4. MS: no deformity or atrophy. Skin: warm and dry, no rash. Neuro:  Strength and sensation are intact. Psych: Normal affect.  Concerned about his health.  Accessory Clinical Findings    ECG personally reviewed by me today-sinus rhythm with frequent PVCs, ventricular rate of 64 bpm.-PVCs are new compared to EKG in ED.  Lab Results  Component Value Date  WBC 5.8 10/05/2021   HGB 14.4 10/05/2021   HCT 42.5 10/05/2021   MCV 96.2 10/05/2021   PLT 116 (L) 10/05/2021   Lab Results  Component Value Date   CREATININE 1.02 10/05/2021   BUN 21 10/05/2021   NA 139 10/05/2021   K 3.6 10/05/2021   CL 107 10/05/2021   CO2 23 10/05/2021   Lab Results  Component Value Date   ALT 28 08/06/2019   AST 25 08/06/2019   ALKPHOS 85 08/06/2019   BILITOT 0.6 08/06/2019   No results found for: CHOL, HDL, LDLCALC, LDLDIRECT, TRIG, CHOLHDL  No results found for: HGBA1C  Review of Prior Studies: Echocardiogram 08/06/2019:  1. Left ventricular ejection fraction, by estimation, is 50 to 55%. The  left ventricle has low normal function. The left ventricle has no regional  wall motion abnormalities. There is moderate left ventricular hypertrophy.  Left ventricular diastolic  parameters are consistent with Grade I diastolic dysfunction (impaired  relaxation).   2. Right ventricular systolic function is normal. The right ventricular  size is normal.   3. The mitral valve is abnormal. Trivial mitral valve regurgitation.   4. The aortic valve is tricuspid. Aortic valve regurgitation is not  visualized. Mild aortic valve sclerosis is present, with no evidence of  aortic valve stenosis.    Cardiac monitor 09/20/2019: ZIO XT reviewed.  5 days 23 hours analyzed.  Predominant rhythm is sinus with heart rate ranging from 47  bpm up to 100 bpm and average heart rate 62 bpm.  There were occasional PACs representing 2.5% of total beats and occasional PVCs representing 3% of total beats.  Also relatively brief episodes of SVT, the longest of which lasted for 14 beats.  No sustained arrhythmias or pauses.  Assessment & Plan   1.  Chest pain: Described as severe pressure and pain with associated shortness of breath and fatigue.  Was seen in ED and ruled out for ACS.  Multiple cardiovascular risk factors to include hypertension, male, age, stress, and hyperlipidemia.  I would like to do ischemic testing on this gentleman and have discussed nuclear medicine stress test versus coronary CTA.  He does not wish to undergo stress test and has opted for coronary CTA.  This will be completed with FFR for definitive evaluation of coronary calcium, possible stenosis.  2.  Hypertension: BP is well controlled on current medication regimen.  I will not make any changes in his regimen at this time.  3.  Hypercholesterolemia: Continue atorvastatin 40 mg daily.  Do not think his leg cramping is related to myalgia symptoms from statin.  Continue to follow lipids and LFTs every 6 months.  This is completed by PCP, Dr. [place] Crane.  4. Severe leg cramping: He is on a PPI which can decrease magnesium levels.  I have looked at his potassium status and found it to be normal at 3.6.  I will add magnesium glycinate 100 mg daily which can be increased to twice daily dosing to assist in his leg cramps and possibly with frequent ventricular arrhythmias.  5. Situational anxiety: Likely related to concerns about his heart health, upcoming wedding, and overall health status.  If this becomes more worrisome to him would recommend that he see his primary care provider to assess need for antianxiety medications.  Current medicines are reviewed at length with the patient today.  I have spent 40 min's  dedicated to the care of this patient on the date of this encounter  to include pre-visit  review of records, assessment, management and diagnostic testing,with shared decision making. Signed, Phill Myron. West Pugh, ANP, AACC   10/08/2021 12:51 PM    [facility] [address] Office [phone] Fax [phone]  Notice: This dictation was prepared with Dragon dictation along with smaller phrase technology. Any transcriptional errors that result from this process are unintentional and may not be corrected upon review.

## 2021-10-08 ENCOUNTER — Telehealth: Payer: Self-pay

## 2021-10-08 ENCOUNTER — Ambulatory Visit (INDEPENDENT_AMBULATORY_CARE_PROVIDER_SITE_OTHER): Payer: Medicare Other | Admitting: Adult Health

## 2021-10-08 ENCOUNTER — Encounter: Payer: Self-pay | Admitting: Adult Health

## 2021-10-08 VITALS — BP 116/60 | HR 63 | Ht 66.0 in | Wt 186.0 lb

## 2021-10-08 DIAGNOSIS — I10 Essential (primary) hypertension: Secondary | ICD-10-CM

## 2021-10-08 DIAGNOSIS — R079 Chest pain, unspecified: Secondary | ICD-10-CM | POA: Diagnosis not present

## 2021-10-08 DIAGNOSIS — E7800 Pure hypercholesterolemia, unspecified: Secondary | ICD-10-CM

## 2021-10-08 DIAGNOSIS — R252 Cramp and spasm: Secondary | ICD-10-CM

## 2021-10-08 MED ORDER — MAGNESIUM GLYCINATE 100 MG PO CAPS: 100.0000 mg | ORAL_CAPSULE | Freq: Every day | ORAL | 2 refills | Status: DC

## 2021-10-08 MED ORDER — METOPROLOL TARTRATE 50 MG PO TABS: 50.0000 mg | ORAL_TABLET | Freq: Two times a day (BID) | ORAL | 0 refills | Status: DC

## 2021-10-08 NOTE — Telephone Encounter (Signed)
Error

## 2021-10-08 NOTE — Patient Instructions (Signed)
Medication Instructions:  Start Magnesium 100 mg ( Take 1 Tablet Daily). *If you need a refill on your cardiac medications before your next appointment, please call your pharmacy*   Lab Work: No Labs If you have labs (blood work) drawn today and your tests are completely normal, you will receive your results only by: [facility] (if you have MyChart) OR A paper copy in the mail If you have any lab test that is abnormal or we need to change your treatment, we will call you to review the results.   Testing/Procedures:   Your cardiac CT will be scheduled at one of the below locations:   [facility] [address] [place], [place] [address] [phone]  [place] [address] [place], [place] [address] [phone]  If scheduled at [facility], please arrive at the [facility] and Children's Entrance (Entrance C2) of [facility] 30 minutes prior to test start time. You can use the FREE valet parking offered at entrance C (encouraged to control the heart rate for the test)  Proceed to the [facility] Radiology Department (first floor) to check-in and test prep.  All radiology patients and guests should use entrance C2 at [facility], accessed from [facility], even though the hospital's physical address listed is [address]    If scheduled at [facility], please arrive 15 mins early for check-in and test prep.  Please follow these instructions carefully (unless otherwise directed):  Hold all erectile dysfunction medications at least 3 days (72 hrs) prior to test.  On the Night Before the Test: Be sure to Drink plenty of water. Do not consume any caffeinated/decaffeinated beverages or chocolate 12 hours prior to your test. Do not take any antihistamines 12 hours prior to your test. If the patient has contrast allergy: Patient will  need a prescription for Prednisone and very clear instructions (as follows): Prednisone 50 mg - take 13 hours prior to test Take another Prednisone 50 mg 7 hours prior to test Take another Prednisone 50 mg 1 hour prior to test Take Benadryl 50 mg 1 hour prior to test Patient must complete all four doses of above prophylactic medications. Patient will need a ride after test due to Benadryl.  On the Day of the Test: Drink plenty of water until 1 hour prior to the test. Do not eat any food 4 hours prior to the test. You may take your regular medications prior to the test.  Take metoprolol (Lopressor) two hours prior to test. HOLD Furosemide/Hydrochlorothiazide morning of the test. FEMALES- please wear underwire-free bra if available, avoid dresses & tight clothing   *For Clinical Staff only. Please instruct patient the following:* Heart Rate Medication Recommendations for Cardiac CT  Resting HR < 50 bpm  No medication  Resting HR 50-60 bpm and BP >110/50 mmHG   Consider Metoprolol tartrate 25 mg PO 90-120 min prior to scan  Resting HR 60-65 bpm and BP >110/50 mmHG  Metoprolol tartrate 50 mg PO 90-120 minutes prior to scan   Resting HR > 65 bpm and BP >110/50 mmHG  Metoprolol tartrate 100 mg PO 90-120 minutes prior to scan  Consider Ivabradine 10-15 mg PO or a calcium channel blocker for resting HR >60 bpm and contraindication to metoprolol tartrate  Consider Ivabradine 10-15 mg PO in combination with metoprolol tartrate for HR >80 bpm         After  the Test: Drink plenty of water. After receiving IV contrast, you may experience a mild flushed feeling. This is normal. On occasion, you may experience a mild rash up to 24 hours after the test. This is not dangerous. If this occurs, you can take Benadryl 25 mg and increase your fluid intake. If you experience trouble breathing, this can be serious. If it is severe call 911 IMMEDIATELY. If it is mild, please call our office. If you take  any of these medications: Glipizide/Metformin, Avandament, Glucavance, please do not take 48 hours after completing test unless otherwise instructed.  We will call to schedule your test 2-4 weeks out understanding that some insurance companies will need an authorization prior to the service being performed.   For non-scheduling related questions, please contact the cardiac imaging nurse navigator should you have any questions/concerns: Marchia Bond, Cardiac Imaging Nurse Navigator Gordy Clement, Cardiac Imaging Nurse Navigator [facility] Direct Office Dial: [phone]   For scheduling needs, including cancellations and rescheduling, please call [place], [phone].    Follow-Up: At [facility], you and your health needs are our priority.  As part of our continuing mission to provide you with exceptional heart care, we have created designated Provider Care Teams.  These Care Teams include your primary Cardiologist (physician) and Advanced Practice Providers (APPs -  Physician Assistants and Nurse Practitioners) who all work together to provide you with the care you need, when you need it.  We recommend signing up for the patient portal called "MyChart".  Sign up information is provided on this After Visit Summary.  MyChart is used to connect with patients for Virtual Visits (Telemedicine).  Patients are able to view lab/test results, encounter notes, upcoming appointments, etc.  Non-urgent messages can be sent to your provider as well.   To learn more about what you can do with MyChart, go to [email or web address].    Your next appointment:   1 week Post CT Scan  The format for your next appointment:   In Person  Provider:   [name], DNP,FNP    :1}     Important Information About [place]

## 2021-10-14 DIAGNOSIS — H0279 Other degenerative disorders of eyelid and periocular area: Secondary | ICD-10-CM | POA: Diagnosis not present

## 2021-10-14 DIAGNOSIS — H02834 Dermatochalasis of left upper eyelid: Secondary | ICD-10-CM | POA: Diagnosis not present

## 2021-10-14 DIAGNOSIS — H02831 Dermatochalasis of right upper eyelid: Secondary | ICD-10-CM | POA: Diagnosis not present

## 2021-10-14 DIAGNOSIS — H02413 Mechanical ptosis of bilateral eyelids: Secondary | ICD-10-CM | POA: Diagnosis not present

## 2021-10-14 DIAGNOSIS — H53483 Generalized contraction of visual field, bilateral: Secondary | ICD-10-CM | POA: Diagnosis not present

## 2021-10-14 DIAGNOSIS — H47012 Ischemic optic neuropathy, left eye: Secondary | ICD-10-CM | POA: Diagnosis not present

## 2021-10-14 DIAGNOSIS — H57813 Brow ptosis, bilateral: Secondary | ICD-10-CM | POA: Diagnosis not present

## 2021-10-24 ENCOUNTER — Telehealth (HOSPITAL_COMMUNITY): Payer: Self-pay | Admitting: *Deleted

## 2021-10-24 NOTE — Telephone Encounter (Signed)
Reaching out to patient to offer assistance regarding upcoming cardiac imaging study; pt verbalizes understanding of appt date/time, parking situation and where to check in, pre-test NPO status and medications ordered, and verified current allergies; name and call back number provided for further questions should they arise  Gordy Clement RN Navigator Cardiac Imaging [facility] [phone] office [phone] cell  Patient to take '50mg'$  metoprolol tartrate two hours prior to his cardiac CT scan.  He is aware to arrive at 12pm.

## 2021-10-25 ENCOUNTER — Ambulatory Visit (HOSPITAL_COMMUNITY)
Admission: RE | Admit: 2021-10-25 | Discharge: 2021-10-25 | Disposition: A | Discharge status: Home / Self Care | Payer: Medicare Other | Source: Ambulatory Visit | Attending: Adult Health | Admitting: Adult Health

## 2021-10-25 ENCOUNTER — Telehealth: Payer: Self-pay

## 2021-10-25 ENCOUNTER — Telehealth: Payer: Self-pay | Admitting: Adult Health

## 2021-10-25 ENCOUNTER — Other Ambulatory Visit: Payer: Self-pay

## 2021-10-25 DIAGNOSIS — R079 Chest pain, unspecified: Secondary | ICD-10-CM | POA: Diagnosis not present

## 2021-10-25 DIAGNOSIS — I200 Unstable angina: Secondary | ICD-10-CM | POA: Diagnosis not present

## 2021-10-25 DIAGNOSIS — I2510 Atherosclerotic heart disease of native coronary artery without angina pectoris: Secondary | ICD-10-CM | POA: Insufficient documentation

## 2021-10-25 MED ORDER — NITROGLYCERIN 0.4 MG SL SUBL
0.8000 mg | SUBLINGUAL_TABLET | Freq: Once | SUBLINGUAL | Status: AC
  Administered 2021-10-25: 0.8 mg via SUBLINGUAL

## 2021-10-25 MED ORDER — IOHEXOL 350 MG/ML SOLN
100.0000 mL | Freq: Once | INTRAVENOUS | Status: AC | PRN
  Administered 2021-10-25: 100 mL via INTRAVENOUS

## 2021-10-25 MED ORDER — NITROGLYCERIN 0.4 MG SL SUBL: 0.4000 mg | SUBLINGUAL_TABLET | SUBLINGUAL | 3 refills | Status: DC | PRN

## 2021-10-25 MED ORDER — NITROGLYCERIN 0.4 MG SL SUBL
SUBLINGUAL_TABLET | SUBLINGUAL | Status: AC
  Filled 2021-10-25: qty 2

## 2021-10-25 NOTE — Telephone Encounter (Signed)
Spoke with Mr. Philip Allen and his fiance by phone to explain to him the need to come to appointment with Dr. 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  I explained to him that he has several vessels that have disease with 1 especially concerning as it is almost completely blocked.  I have planned on going to the beach but have canceled her trip and plan on being at the appointment with Dr. [place] on Monday.  The patient will need a cardiac catheterization as outlined by Dr. Bufford Buttner after he read the coronary CTA.  They verbalized understanding of the need to come to appointment on Monday.  They are also to go to the emergency room should he have recurrent symptoms prior to the appointment at which time we will be admitted and likely will have an emergent catheterization.  Joni Reining, DNP

## 2021-10-28 ENCOUNTER — Other Ambulatory Visit: Payer: Self-pay | Admitting: Cardiology

## 2021-10-28 ENCOUNTER — Ambulatory Visit (INDEPENDENT_AMBULATORY_CARE_PROVIDER_SITE_OTHER): Payer: Medicare Other | Admitting: Cardiology

## 2021-10-28 ENCOUNTER — Telehealth: Payer: Self-pay

## 2021-10-28 ENCOUNTER — Encounter: Payer: Self-pay | Admitting: Cardiology

## 2021-10-28 VITALS — BP 150/70 | HR 56 | Ht 66.0 in | Wt 187.0 lb

## 2021-10-28 DIAGNOSIS — I200 Unstable angina: Secondary | ICD-10-CM

## 2021-10-28 DIAGNOSIS — E7800 Pure hypercholesterolemia, unspecified: Secondary | ICD-10-CM | POA: Diagnosis not present

## 2021-10-28 DIAGNOSIS — I10 Essential (primary) hypertension: Secondary | ICD-10-CM | POA: Diagnosis not present

## 2021-10-28 MED ORDER — SODIUM CHLORIDE 0.9% FLUSH: 3.0000 mL | Freq: Two times a day (BID) | INTRAVENOUS | Status: DC

## 2021-10-29 ENCOUNTER — Ambulatory Visit (HOSPITAL_COMMUNITY)
Admission: RE | Admit: 2021-10-29 | Discharge: 2021-10-29 | Disposition: A | Discharge status: Home / Self Care | Payer: Medicare Other | Attending: Cardiology | Admitting: Cardiology

## 2021-10-29 ENCOUNTER — Telehealth: Payer: Self-pay

## 2021-10-29 ENCOUNTER — Ambulatory Visit (HOSPITAL_COMMUNITY)
Admission: RE | Disposition: A | Discharge status: Home / Self Care | Payer: Self-pay | Source: Home / Self Care | Attending: Cardiology

## 2021-10-29 DIAGNOSIS — I209 Angina pectoris, unspecified: Secondary | ICD-10-CM

## 2021-10-29 DIAGNOSIS — Z7982 Long term (current) use of aspirin: Secondary | ICD-10-CM | POA: Diagnosis not present

## 2021-10-29 DIAGNOSIS — I25110 Atherosclerotic heart disease of native coronary artery with unstable angina pectoris: Secondary | ICD-10-CM | POA: Diagnosis not present

## 2021-10-29 DIAGNOSIS — Z8673 Personal history of transient ischemic attack (TIA), and cerebral infarction without residual deficits: Secondary | ICD-10-CM | POA: Diagnosis not present

## 2021-10-29 DIAGNOSIS — I25119 Atherosclerotic heart disease of native coronary artery with unspecified angina pectoris: Secondary | ICD-10-CM | POA: Diagnosis not present

## 2021-10-29 DIAGNOSIS — Z79899 Other long term (current) drug therapy: Secondary | ICD-10-CM | POA: Diagnosis not present

## 2021-10-29 DIAGNOSIS — E785 Hyperlipidemia, unspecified: Secondary | ICD-10-CM | POA: Insufficient documentation

## 2021-10-29 DIAGNOSIS — I2582 Chronic total occlusion of coronary artery: Secondary | ICD-10-CM | POA: Insufficient documentation

## 2021-10-29 DIAGNOSIS — Z7902 Long term (current) use of antithrombotics/antiplatelets: Secondary | ICD-10-CM | POA: Diagnosis not present

## 2021-10-29 DIAGNOSIS — I10 Essential (primary) hypertension: Secondary | ICD-10-CM | POA: Insufficient documentation

## 2021-10-29 DIAGNOSIS — I200 Unstable angina: Secondary | ICD-10-CM

## 2021-10-29 HISTORY — PX: LEFT HEART CATH AND CORONARY ANGIOGRAPHY: CATH118249

## 2021-10-29 HISTORY — PX: CORONARY STENT INTERVENTION: CATH118234

## 2021-10-29 LAB — BASIC METABOLIC PANEL
BUN/Creatinine Ratio: 18 (ref 10–24)
BUN: 20 mg/dL (ref 8–27)
CO2: 23 mmol/L (ref 20–29)
Calcium: 9.8 mg/dL (ref 8.6–10.2)
Chloride: 104 mmol/L (ref 96–106)
Creatinine, Ser: 1.12 mg/dL (ref 0.76–1.27)
Glucose: 103 mg/dL — ABNORMAL HIGH (ref 70–99)
Potassium: 4.2 mmol/L (ref 3.5–5.2)
Sodium: 142 mmol/L (ref 134–144)
eGFR: 66 mL/min/{1.73_m2} (ref >59)

## 2021-10-29 LAB — HEPATIC FUNCTION PANEL
ALT: 29 IU/L (ref 0–44)
AST: 23 IU/L (ref 0–40)
Albumin: 4.8 g/dL — ABNORMAL HIGH (ref 3.6–4.6)
Alkaline Phosphatase: 110 IU/L (ref 44–121)
Bilirubin Total: 0.7 mg/dL (ref 0.0–1.2)
Bilirubin, Direct: 0.2 mg/dL (ref 0.00–0.40)
Total Protein: 7.1 g/dL (ref 6.0–8.5)

## 2021-10-29 LAB — LIPID PANEL
Chol/HDL Ratio: 2.5 ratio (ref 0.0–5.0)
Cholesterol, Total: 135 mg/dL (ref 100–199)
HDL: 54 mg/dL (ref >39)
LDL Chol Calc (NIH): 62 mg/dL (ref 0–99)
Triglycerides: 105 mg/dL (ref 0–149)
VLDL Cholesterol Cal: 19 mg/dL (ref 5–40)

## 2021-10-29 LAB — CBC WITH DIFFERENTIAL/PLATELET
Basophils Absolute: 0 10*3/uL (ref 0.0–0.2)
Basos: 0 %
EOS (ABSOLUTE): 0.2 10*3/uL (ref 0.0–0.4)
Eos: 3 %
Hematocrit: 44 % (ref 37.5–51.0)
Hemoglobin: 14.9 g/dL (ref 13.0–17.7)
Immature Grans (Abs): 0 10*3/uL (ref 0.0–0.1)
Immature Granulocytes: 0 %
Lymphocytes Absolute: 1.3 10*3/uL (ref 0.7–3.1)
Lymphs: 20 %
MCH: 32.9 pg (ref 26.6–33.0)
MCHC: 33.9 g/dL (ref 31.5–35.7)
MCV: 97 fL (ref 79–97)
Monocytes Absolute: 0.5 10*3/uL (ref 0.1–0.9)
Monocytes: 8 %
Neutrophils Absolute: 4.6 10*3/uL (ref 1.4–7.0)
Neutrophils: 69 %
Platelets: 116 10*3/uL — ABNORMAL LOW (ref 150–450)
RBC: 4.53 x10E6/uL (ref 4.14–5.80)
RDW: 12.6 % (ref 11.6–15.4)
WBC: 6.7 10*3/uL (ref 3.4–10.8)

## 2021-10-29 LAB — PT AND PTT
INR: 1 (ref 0.9–1.2)
Prothrombin Time: 10.4 s (ref 9.1–12.0)
aPTT: 24 s (ref 24–33)

## 2021-10-29 LAB — POCT ACTIVATED CLOTTING TIME: Activated Clotting Time: 372 seconds

## 2021-10-29 SURGERY — LEFT HEART CATH AND CORONARY ANGIOGRAPHY
Anesthesia: LOCAL

## 2021-10-29 MED ORDER — SODIUM CHLORIDE 0.9 % IV SOLN
250.0000 mL | INTRAVENOUS | Status: DC | PRN
Start: 2021-10-29 — End: 2021-10-29

## 2021-10-29 MED ORDER — FENTANYL CITRATE (PF) 100 MCG/2ML IJ SOLN
INTRAMUSCULAR | Status: DC | PRN
  Administered 2021-10-29: 25 ug via INTRAVENOUS

## 2021-10-29 MED ORDER — SODIUM CHLORIDE 0.9 % WEIGHT BASED INFUSION: 1.0000 mL/kg/h | INTRAVENOUS | Status: AC

## 2021-10-29 MED ORDER — VERAPAMIL HCL 2.5 MG/ML IV SOLN
INTRAVENOUS | Status: AC
  Filled 2021-10-29: qty 2

## 2021-10-29 MED ORDER — NITROGLYCERIN 1 MG/10 ML FOR IR/CATH LAB
INTRA_ARTERIAL | Status: AC
  Filled 2021-10-29: qty 10

## 2021-10-29 MED ORDER — ACETAMINOPHEN 325 MG PO TABS: 650.0000 mg | ORAL_TABLET | ORAL | Status: DC | PRN

## 2021-10-29 MED ORDER — SODIUM CHLORIDE 0.9% FLUSH: 3.0000 mL | Freq: Two times a day (BID) | INTRAVENOUS | Status: DC

## 2021-10-29 MED ORDER — SODIUM CHLORIDE 0.9% FLUSH
3.0000 mL | INTRAVENOUS | Status: DC | PRN
Start: 2021-10-29 — End: 2021-10-29

## 2021-10-29 MED ORDER — ASPIRIN 81 MG PO CHEW: 81.0000 mg | CHEWABLE_TABLET | ORAL | Status: DC

## 2021-10-29 MED ORDER — SODIUM CHLORIDE 0.9 % WEIGHT BASED INFUSION
3.0000 mL/kg/h | INTRAVENOUS | Status: AC
  Administered 2021-10-29: 3 mL/kg/h via INTRAVENOUS

## 2021-10-29 MED ORDER — HEPARIN SODIUM (PORCINE) 1000 UNIT/ML IJ SOLN
INTRAMUSCULAR | Status: AC
  Filled 2021-10-29: qty 10

## 2021-10-29 MED ORDER — LIDOCAINE HCL (PF) 1 % IJ SOLN
INTRAMUSCULAR | Status: DC | PRN
  Administered 2021-10-29: 2 mL

## 2021-10-29 MED ORDER — MIDAZOLAM HCL 2 MG/2ML IJ SOLN
INTRAMUSCULAR | Status: AC
  Filled 2021-10-29: qty 2

## 2021-10-29 MED ORDER — MIDAZOLAM HCL 2 MG/2ML IJ SOLN
INTRAMUSCULAR | Status: DC | PRN
  Administered 2021-10-29: 1 mg via INTRAVENOUS

## 2021-10-29 MED ORDER — SODIUM CHLORIDE 0.9 % WEIGHT BASED INFUSION: 1.0000 mL/kg/h | INTRAVENOUS | Status: DC

## 2021-10-29 MED ORDER — SODIUM CHLORIDE 0.9% FLUSH: 3.0000 mL | INTRAVENOUS | Status: DC | PRN

## 2021-10-29 MED ORDER — VERAPAMIL HCL 2.5 MG/ML IV SOLN
INTRAVENOUS | Status: DC | PRN
  Administered 2021-10-29: 10 mL via INTRA_ARTERIAL

## 2021-10-29 MED ORDER — HYDRALAZINE HCL 20 MG/ML IJ SOLN: 10.0000 mg | INTRAMUSCULAR | Status: DC | PRN

## 2021-10-29 MED ORDER — HEPARIN SODIUM (PORCINE) 1000 UNIT/ML IJ SOLN
INTRAMUSCULAR | Status: DC | PRN
  Administered 2021-10-29 (×2): 4500 [IU] via INTRAVENOUS

## 2021-10-29 MED ORDER — FENTANYL CITRATE (PF) 100 MCG/2ML IJ SOLN
INTRAMUSCULAR | Status: AC
  Filled 2021-10-29: qty 2

## 2021-10-29 MED ORDER — ONDANSETRON HCL 4 MG/2ML IJ SOLN: 4.0000 mg | Freq: Four times a day (QID) | INTRAMUSCULAR | Status: DC | PRN

## 2021-10-29 MED ORDER — HEPARIN (PORCINE) IN NACL 1000-0.9 UT/500ML-% IV SOLN
INTRAVENOUS | Status: DC | PRN
  Administered 2021-10-29 (×2): 500 mL

## 2021-10-29 MED ORDER — SODIUM CHLORIDE 0.9 % IV SOLN: 250.0000 mL | INTRAVENOUS | Status: DC | PRN

## 2021-10-29 MED ORDER — LIDOCAINE HCL (PF) 1 % IJ SOLN
INTRAMUSCULAR | Status: AC
  Filled 2021-10-29: qty 30

## 2021-10-29 MED ORDER — IOHEXOL 350 MG/ML SOLN
INTRAVENOUS | Status: DC | PRN
  Administered 2021-10-29: 90 mL

## 2021-10-29 MED ORDER — HEPARIN (PORCINE) IN NACL 1000-0.9 UT/500ML-% IV SOLN
INTRAVENOUS | Status: AC
  Filled 2021-10-29: qty 1000

## 2021-10-29 SURGICAL SUPPLY — 13 items
BAND ZEPHYR COMPRESS 30 LONG (HEMOSTASIS) ×1 IMPLANT
CATH 5FR JL3.5 JR4 ANG PIG MP (CATHETERS) ×1 IMPLANT
CATH VISTA GUIDE 6FR XBLAD3.5 (CATHETERS) ×1 IMPLANT
GLIDESHEATH SLEND SS 6F .021 (SHEATH) ×1 IMPLANT
GUIDEWIRE INQWIRE 1.5J.035X260 (WIRE) IMPLANT
INQWIRE 1.5J .035X260CM (WIRE) ×2
KIT ENCORE 26 ADVANTAGE (KITS) ×1 IMPLANT
KIT HEART LEFT (KITS) ×2 IMPLANT
PACK CARDIAC CATHETERIZATION (CUSTOM PROCEDURE TRAY) ×2 IMPLANT
TRANSDUCER W/STOPCOCK (MISCELLANEOUS) ×2 IMPLANT
TUBING CIL FLEX 10 FLL-RA (TUBING) ×2 IMPLANT
WIRE ASAHI MIRACLEBROS-3 180CM (WIRE) ×1 IMPLANT
WIRE ASAHI PROWATER 180CM (WIRE) ×1 IMPLANT

## 2021-10-29 NOTE — Progress Notes (Signed)
CARDIAC REHAB PHASE I   Not appropriate for cardiac rehab phase 1, no intervention completed at this time.  Reynold Bowen, RN BSN 10/29/2021 2:15 PM

## 2021-10-30 ENCOUNTER — Encounter (HOSPITAL_COMMUNITY): Payer: Self-pay | Admitting: Cardiology

## 2021-10-30 MED FILL — Nitroglycerin IV Soln 100 MCG/ML in D5W: INTRA_ARTERIAL | Qty: 10 | Status: AC

## 2021-10-31 ENCOUNTER — Encounter: Payer: Self-pay | Admitting: Cardiology

## 2021-10-31 ENCOUNTER — Ambulatory Visit (INDEPENDENT_AMBULATORY_CARE_PROVIDER_SITE_OTHER): Payer: Medicare Other | Admitting: Cardiology

## 2021-10-31 ENCOUNTER — Other Ambulatory Visit: Payer: Self-pay | Admitting: Cardiology

## 2021-10-31 VITALS — BP 140/78 | HR 58 | Ht 66.0 in | Wt 189.2 lb

## 2021-10-31 DIAGNOSIS — I209 Angina pectoris, unspecified: Secondary | ICD-10-CM

## 2021-10-31 DIAGNOSIS — I25118 Atherosclerotic heart disease of native coronary artery with other forms of angina pectoris: Secondary | ICD-10-CM

## 2021-10-31 DIAGNOSIS — E7800 Pure hypercholesterolemia, unspecified: Secondary | ICD-10-CM | POA: Diagnosis not present

## 2021-10-31 DIAGNOSIS — I10 Essential (primary) hypertension: Secondary | ICD-10-CM | POA: Diagnosis not present

## 2021-10-31 DIAGNOSIS — I200 Unstable angina: Secondary | ICD-10-CM

## 2021-10-31 MED ORDER — SODIUM CHLORIDE 0.9% FLUSH: 3.0000 mL | Freq: Two times a day (BID) | INTRAVENOUS | Status: DC

## 2021-10-31 NOTE — Patient Instructions (Signed)
Medication Instructions:  Continue same medications *If you need a refill on your cardiac medications before your next appointment, please call your pharmacy*   Lab Work: None needed   Testing/Procedures: Cardiac Cath  ( CTO )  follow instructions below   Follow-Up: At [facility], you and your health needs are our priority.  As part of our continuing mission to provide you with exceptional heart care, we have created designated Provider Care Teams.  These Care Teams include your primary Cardiologist (physician) and Advanced Practice Providers (APPs -  Physician Assistants and Nurse Practitioners) who all work together to provide you with the care you need, when you need it.  We recommend signing up for the patient portal called "MyChart".  Sign up information is provided on this After Visit Summary.  MyChart is used to connect with patients for Virtual Visits (Telemedicine).  Patients are able to view lab/test results, encounter notes, upcoming appointments, etc.  Non-urgent messages can be sent to your provider as well.   To learn more about what you can do with MyChart, go to [email or web address].    Your next appointment:      The format for your next appointment: Office   Provider:  Ashley Sturgeon [place] [address] Dept: [phone] Loc: [place]  10/31/2021  You are scheduled for a Cardiac Catheterization ( CTO )   on Wednesday, July 12 with Dr. Peter Martinique.  1. Please arrive at the Main Entrance A at [facility]: [place], [place] [address] at 6:30 AM (This time is two hours before your procedure to ensure your preparation). Free valet parking service is available.   Special note: Every effort is made to have your procedure done on time. Please understand that emergencies sometimes delay scheduled  procedures.  2. Diet: Do not eat solid foods after midnight.  You may have clear liquids until 5 AM upon the day of the procedure.  3. Labs: None needed  4. Medication instructions in preparation for your procedure          On the morning of your procedure, take Aspirin and Plavix/Clopidogrel and any morning medicines NOT listed above.  You may use sips of water.  5. Plan to go home the same day, you will only stay overnight if medically necessary. 6. You MUST have a responsible adult to drive you home. 7. An adult MUST be with you the first 24 hours after you arrive home. 8. Bring a current list of your medications, and the last time and date medication taken. 9. Bring ID and current insurance cards. 10.Please wear clothes that are easy to get on and off and wear slip-on shoes.  Thank you for allowing [place] to care for you!   -- [facility]  Important Information About [place]

## 2021-10-31 NOTE — Progress Notes (Signed)
Cardiology Office Note   Date:  10/31/2021   ID:  Philip Allen, DOB 1941/03/23, MRN [id number]  PCP:  Celene Squibb, MD  Cardiologist:  Rozann Lesches MD   No chief complaint on file.     History of Present Illness: Philip Allen is a 81 y.o. male who presents as a work in following a high risk coronary CTA. He has been evaluated by Dr Domenic Polite in the past for syncope. Evaluation in 2021 showed unremarkable Echo. Event monitor showed PACs and PVCs. Managed medically. He has a history of CVA, HTN, HLD and ED. He was seen in the emergency room on 10/05/2021 for complaints of chest heaviness located in the center of his chest with shortness of breath.  He felt weakness and was unable to stand so he laid down on the ground but never lost consciousness.  He reported that it lasted approximately 10 minutes.  His wife provided him 4 baby aspirin and symptoms resolved after 10 minutes however he was seen in the ED for further evaluation.  EKG did not show evidence of ACS, it revealed sinus rhythm with borderline intraventricular conduction delay similar to prior EKG in 2021.  He declined admission, and he was found to have 2 negative cardiac enzymes during observation in ED. He was seen by [name] NP in the office on 10/08/21 and scheduled for coronary CTA. This showed a calcium score of 1435 and subtotal occlusion of the LAD. Nonobstructive disease in other vessels. It was recommended he be seen to consider cardiac cath. On follow up today he states since his episode 2 weeks ago he hasn't had any more pain but has not been doing anything.   He did undergo cardiac cath 2 days ago demonstrating occlusion of the LAD after the first diagonal. It fills by right to left collaterals from the RCA. We attempted to cross but were unable to cross with a prowater or [employer] 3 wire. He is seen today to discuss CTO PCI. No problems from his cath procedure.    Past Medical History:  Diagnosis  Date   BPH (benign prostatic hypertrophy)    GERD (gastroesophageal reflux disease)    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   Hyperlipidemia    Hypertension    Urgency of urination     Past Surgical History:  Procedure Laterality Date   CORONARY STENT INTERVENTION N/A 10/29/2021   Procedure: CORONARY STENT INTERVENTION;  Surgeon: Martinique, Sedonia Kitner M, MD;  Location: [facility];  Service: Cardiovascular;  Laterality: N/A;   LEFT HEART CATH AND CORONARY ANGIOGRAPHY N/A 10/29/2021   Procedure: LEFT HEART CATH AND CORONARY ANGIOGRAPHY;  Surgeon: Martinique, Sarah Zerby M, MD;  Location: [facility];  Service: Cardiovascular;  Laterality: N/A;   RIGHT SHOULDER SURGERY  1998   TRANSURETHRAL RESECTION OF PROSTATE  02/05/2012   Procedure: TRANSURETHRAL RESECTION OF THE PROSTATE WITH GYRUS INSTRUMENTS;  Surgeon: Ailene Rud, MD;  Location: [facility];  Service: Urology;  Laterality: N/A;  WITH GYRUS SUPRAPUBIC TUBE OWER TO MAIN     Current Outpatient Medications  Medication Sig Dispense Refill   amLODipine (NORVASC) 10 MG tablet Take 10 mg by mouth daily.     aspirin EC 81 MG tablet Take 81 mg by mouth daily.     atorvastatin (LIPITOR) 40 MG tablet Take 40 mg by mouth daily.     celecoxib (CELEBREX) 200 MG capsule Take 400  mg by mouth daily with breakfast.      clopidogrel (PLAVIX) 75 MG tablet Take 75 mg by mouth daily.     Magnesium 400 MG TABS Take 400 mg by mouth daily.     Magnesium Glycinate 100 MG CAPS Take 100 mg by mouth daily. 60 capsule 2   memantine (NAMENDA) 10 MG tablet Take 10 mg by mouth 2 (two) times daily.     metoprolol tartrate (LOPRESSOR) 50 MG tablet Take 1 tablet (50 mg total) by mouth 2 (two) times daily. 1 tablet 0   nitroGLYCERIN (NITROSTAT) 0.4 MG SL tablet Place 1 tablet (0.4 mg total) under the tongue every 5 (five) minutes as needed for chest pain. 25 tablet 3   pantoprazole (PROTONIX) 40 MG tablet Take 40 mg by mouth daily  before breakfast.     temazepam (RESTORIL) 30 MG capsule Take 30 mg by mouth at bedtime as needed for sleep.     No current facility-administered medications for this visit.    Allergies:   Naproxen    Social History:  The patient  reports that he has never smoked. He has never used smokeless tobacco. He reports current alcohol use. He reports that he does not use drugs.   Family History:  The patient's family history includes Heart disease in his father and mother.    ROS:  Please see the history of present illness.   Otherwise, review of systems are positive for none.   All other systems are reviewed and negative.    PHYSICAL EXAM: VS:  BP 140/78   Pulse (!) 58   Ht '5\' 6"'$  (1.676 m)   Wt 189 lb 3.2 oz (85.8 kg)   SpO2 99%   BMI 30.54 kg/m  , BMI Body mass index is 30.54 kg/m. GEN: Well nourished, well developed, in no acute distress HEENT: normal Neck: no JVD, carotid bruits, or masses Cardiac: RRR; no murmurs, rubs, or gallops,no edema, right radial site with mild bruising no hematoma Respiratory:  clear to auscultation bilaterally, normal work of breathing GI: soft, nontender, nondistended, + BS MS: no deformity or atrophy Skin: warm and dry, no rash Neuro:  Strength and sensation are intact Psych: euthymic mood, full affect   EKG:  EKG is not ordered today.     Recent Labs: 10/28/2021: ALT 29; BUN 20; Creatinine, Ser 1.12; Hemoglobin 14.9; Platelets 116; Potassium 4.2; Sodium 142    Lipid Panel    Component Value Date/Time   CHOL 135 10/28/2021 1139   TRIG 105 10/28/2021 1139   HDL 54 10/28/2021 1139   CHOLHDL 2.5 10/28/2021 1139   LDLCALC 62 10/28/2021 1139      Wt Readings from Last 3 Encounters:  10/31/21 189 lb 3.2 oz (85.8 kg)  10/29/21 182 lb (82.6 kg)  10/28/21 187 lb (84.8 kg)      Other studies Reviewed: Additional studies/ records that were reviewed today include:   Echocardiogram 08/06/2019:  1. Left ventricular ejection fraction, by  estimation, is 50 to 55%. The  left ventricle has low normal function. The left ventricle has no regional  wall motion abnormalities. There is moderate left ventricular hypertrophy.  Left ventricular diastolic  parameters are consistent with Grade I diastolic dysfunction (impaired  relaxation).   2. Right ventricular systolic function is normal. The right ventricular  size is normal.   3. The mitral valve is abnormal. Trivial mitral valve regurgitation.   4. The aortic valve is tricuspid. Aortic valve regurgitation is not  visualized.  Mild aortic valve sclerosis is present, with no evidence of  aortic valve stenosis.    Cardiac monitor 09/20/2019: ZIO XT reviewed.  5 days 23 hours analyzed.  Predominant rhythm is sinus with heart rate ranging from 47 bpm up to 100 bpm and average heart rate 62 bpm.  There were occasional PACs representing 2.5% of total beats and occasional PVCs representing 3% of total beats.  Also relatively brief episodes of SVT, the longest of which lasted for 14 beats.  No sustained arrhythmias or pauses.  CLINICAL DATA:  Chest pain   EXAM: Cardiac/Coronary CTA   TECHNIQUE: A non-contrast, gated CT scan was obtained with axial slices of 3 mm through the heart for calcium scoring. Calcium scoring was performed using the Agatston method. A 120 kV prospective, gated, contrast cardiac scan was obtained. Gantry rotation speed was 250 msecs and collimation was 0.6 mm. Two sublingual nitroglycerin tablets (0.8 mg) were given. The 3D data set was reconstructed in 5% intervals of the 35-75% of the R-R cycle. Diastolic phases were analyzed on a dedicated workstation using MPR, MIP, and VRT modes. The patient received 95 cc of contrast.   FINDINGS: Image quality: Excellent.   Noise artifact is: Limited.   Coronary Arteries:  Normal coronary origin.  Right dominance.   Left main: The left main is a large caliber vessel with a normal take off from the left coronary cusp  that trifurcates into a LAD, LCX, and ramus intermedius. There is minimal calcified plaque (<25%).   Left anterior descending artery: The proximal LAD is subtotally occluded (100%) after the first diagonal. The first diagonal contains mild calcified plaque (<25%).   Ramus intermedius: Patent with no evidence of plaque or stenosis.   Left circumflex artery: The LCX is non-dominant. The proximal segment contains mild calcified plaque (25-49%). The LCX gives off 1 patent obtuse marginal branch.   Right coronary artery: The RCA is dominant with normal take off from the right coronary cusp. There is minimal calcified plaque (<25%) in the proximal and mid segments. The distal RCA contains mild calcified plaque (25-49%). The RCA terminates as a PDA and right posterolateral branch. The PDA is patent. The PLV contains minimal calcified plaque (<25%).   Right Atrium: Right atrial size is within normal limits.   Right Ventricle: The right ventricular cavity is within normal limits.   Left Atrium: Left atrial size is normal in size with no left atrial appendage filling defect.   Left Ventricle: The ventricular cavity size is within normal limits.   Pulmonary arteries: Normal in size without proximal filling defect.   Pulmonary veins: Normal pulmonary venous drainage.   Pericardium: Normal thickness without significant effusion or calcium present.   Cardiac valves: The aortic valve is trileaflet without significant calcification. The mitral valve is normal without significant calcification.   Aorta: Normal caliber without significant disease.   Extra-cardiac findings: See attached radiology report for non-cardiac structures.   IMPRESSION: 1. Coronary calcium score of 1435. This was 75th percentile for age-, sex, and race-matched controls.   2. Normal coronary origin with right dominance.   3. Subtotal occlusion of the proximal LAD (100%).   4. Mild CAD (25-49%) in the  LCX/RCA.   RECOMMENDATIONS: 1. Total coronary occlusion (100%). Cardiac catheterization recommended.   Eleonore Chiquito, MD     Electronically Signed   By: Eleonore Chiquito M.D.   On: 10/25/2021 15:22  CORONARY STENT INTERVENTION  LEFT HEART CATH AND CORONARY ANGIOGRAPHY   Conclusion  Ost LM to Mid LM lesion is 20% stenosed.   Prox LAD to Mid LAD lesion is 100% stenosed.   Ost Cx to Prox Cx lesion is 50% stenosed.   RPAV lesion is 30% stenosed.   Mid RCA to Dist RCA lesion is 20% stenosed.   Post intervention, there is a 100% residual stenosis.   The left ventricular systolic function is normal.   LV end diastolic pressure is mildly elevated.   The left ventricular ejection fraction is 55-65% by visual estimate.   Single vessel occlusive CAD with CTO of the mid LAD immediately after the first diagonal. There are good right to left collaterals from the RCA. The LAD is a large vessel Normal LV function. Mildly elevated LVEDP 22 mm Hg Unsuccessful PCI of the LAD - unable to cross with lesion with a wire.   Plan: will DC home today. Continue medical therapy. Will consider bringing him back for dedicated CTO PCI.   Coronary Diagrams  Diagnostic Dominance: Right  Intervention    ASSESSMENT AND PLAN:  1.  CAD with angina pectoris. Coronary CTA indicates occlusion of the LAD. This is confirmed on cardiac cath. Otherwise nonobstructive CAD. LV function is normal so he has benefited from collaterals from the RCA. We discussed options for treatment including medical therapy versus CTO PCI. From an intervention standpoint it appears feasible. I reviewed with him chance of success of 75-80%. The procedure and risks were reviewed including but not limited to death, myocardial infarction, perforation, stroke, arrythmias, bleeding, transfusion, emergency surgery, dye allergy, or renal dysfunction. The patient voices understanding and is agreeable to proceed. Will arrange for July 12th.  Patient is planning to get married on July 15th but would like to have this taken care of before going on his honeymoon on Aug 9. This is the only date that would work.   2. HLD on statin. LDL at goal 62.   3. HTN   Current medicines are reviewed at length with the patient today.  The patient does not have concerns regarding medicines.  The following changes have been made:  no change  Labs/ tests ordered today include:   No orders of the defined types were placed in this encounter.        Disposition:   FU post PCI  Signed, Maricel Swartzendruber Martinique, MD  10/31/2021 10:20 AM    [facility] [address] [place], [place], [address] Phone [phone], Fax [phone]

## 2021-10-31 NOTE — H&P (View-Only) (Signed)
Cardiology Office Note   Date:  10/31/2021   ID:  Philip Allen, DOB 1940/08/10, MRN [id number]  PCP:  Celene Squibb, MD  Cardiologist:  Rozann Lesches MD   No chief complaint on file.     History of Present Illness: Philip Allen is a 81 y.o. male who presents as a work in following a high risk coronary CTA. He has been evaluated by Dr Domenic Polite in the past for syncope. Evaluation in 2021 showed unremarkable Echo. Event monitor showed PACs and PVCs. Managed medically. He has a history of CVA, HTN, HLD and ED. He was seen in the emergency room on 10/05/2021 for complaints of chest heaviness located in the center of his chest with shortness of breath.  He felt weakness and was unable to stand so he laid down on the ground but never lost consciousness.  He reported that it lasted approximately 10 minutes.  His wife provided him 4 baby aspirin and symptoms resolved after 10 minutes however he was seen in the ED for further evaluation.  EKG did not show evidence of ACS, it revealed sinus rhythm with borderline intraventricular conduction delay similar to prior EKG in 2021.  He declined admission, and he was found to have 2 negative cardiac enzymes during observation in ED. He was seen by [name] NP in the office on 10/08/21 and scheduled for coronary CTA. This showed a calcium score of 1435 and subtotal occlusion of the LAD. Nonobstructive disease in other vessels. It was recommended he be seen to consider cardiac cath. On follow up today he states since his episode 2 weeks ago he hasn't had any more pain but has not been doing anything.   He did undergo cardiac cath 2 days ago demonstrating occlusion of the LAD after the first diagonal. It fills by right to left collaterals from the RCA. We attempted to cross but were unable to cross with a prowater or [employer] 3 wire. He is seen today to discuss CTO PCI. No problems from his cath procedure.    Past Medical History:  Diagnosis  Date   BPH (benign prostatic hypertrophy)    GERD (gastroesophageal reflux disease)    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   Hyperlipidemia    Hypertension    Urgency of urination     Past Surgical History:  Procedure Laterality Date   CORONARY STENT INTERVENTION N/A 10/29/2021   Procedure: CORONARY STENT INTERVENTION;  Surgeon: Martinique, Lowell Makara M, MD;  Location: [facility];  Service: Cardiovascular;  Laterality: N/A;   LEFT HEART CATH AND CORONARY ANGIOGRAPHY N/A 10/29/2021   Procedure: LEFT HEART CATH AND CORONARY ANGIOGRAPHY;  Surgeon: Martinique, Kaito Schulenburg M, MD;  Location: [facility];  Service: Cardiovascular;  Laterality: N/A;   RIGHT SHOULDER SURGERY  1998   TRANSURETHRAL RESECTION OF PROSTATE  02/05/2012   Procedure: TRANSURETHRAL RESECTION OF THE PROSTATE WITH GYRUS INSTRUMENTS;  Surgeon: Ailene Rud, MD;  Location: [facility];  Service: Urology;  Laterality: N/A;  WITH GYRUS SUPRAPUBIC TUBE OWER TO MAIN     Current Outpatient Medications  Medication Sig Dispense Refill   amLODipine (NORVASC) 10 MG tablet Take 10 mg by mouth daily.     aspirin EC 81 MG tablet Take 81 mg by mouth daily.     atorvastatin (LIPITOR) 40 MG tablet Take 40 mg by mouth daily.     celecoxib (CELEBREX) 200 MG capsule Take 400  mg by mouth daily with breakfast.      clopidogrel (PLAVIX) 75 MG tablet Take 75 mg by mouth daily.     Magnesium 400 MG TABS Take 400 mg by mouth daily.     Magnesium Glycinate 100 MG CAPS Take 100 mg by mouth daily. 60 capsule 2   memantine (NAMENDA) 10 MG tablet Take 10 mg by mouth 2 (two) times daily.     metoprolol tartrate (LOPRESSOR) 50 MG tablet Take 1 tablet (50 mg total) by mouth 2 (two) times daily. 1 tablet 0   nitroGLYCERIN (NITROSTAT) 0.4 MG SL tablet Place 1 tablet (0.4 mg total) under the tongue every 5 (five) minutes as needed for chest pain. 25 tablet 3   pantoprazole (PROTONIX) 40 MG tablet Take 40 mg by mouth daily  before breakfast.     temazepam (RESTORIL) 30 MG capsule Take 30 mg by mouth at bedtime as needed for sleep.     No current facility-administered medications for this visit.    Allergies:   Naproxen    Social History:  The patient  reports that he has never smoked. He has never used smokeless tobacco. He reports current alcohol use. He reports that he does not use drugs.   Family History:  The patient's family history includes Heart disease in his father and mother.    ROS:  Please see the history of present illness.   Otherwise, review of systems are positive for none.   All other systems are reviewed and negative.    PHYSICAL EXAM: VS:  BP 140/78   Pulse (!) 58   Ht '5\' 6"'$  (1.676 m)   Wt 189 lb 3.2 oz (85.8 kg)   SpO2 99%   BMI 30.54 kg/m  , BMI Body mass index is 30.54 kg/m. GEN: Well nourished, well developed, in no acute distress HEENT: normal Neck: no JVD, carotid bruits, or masses Cardiac: RRR; no murmurs, rubs, or gallops,no edema, right radial site with mild bruising no hematoma Respiratory:  clear to auscultation bilaterally, normal work of breathing GI: soft, nontender, nondistended, + BS MS: no deformity or atrophy Skin: warm and dry, no rash Neuro:  Strength and sensation are intact Psych: euthymic mood, full affect   EKG:  EKG is not ordered today.     Recent Labs: 10/28/2021: ALT 29; BUN 20; Creatinine, Ser 1.12; Hemoglobin 14.9; Platelets 116; Potassium 4.2; Sodium 142    Lipid Panel    Component Value Date/Time   CHOL 135 10/28/2021 1139   TRIG 105 10/28/2021 1139   HDL 54 10/28/2021 1139   CHOLHDL 2.5 10/28/2021 1139   LDLCALC 62 10/28/2021 1139      Wt Readings from Last 3 Encounters:  10/31/21 189 lb 3.2 oz (85.8 kg)  10/29/21 182 lb (82.6 kg)  10/28/21 187 lb (84.8 kg)      Other studies Reviewed: Additional studies/ records that were reviewed today include:   Echocardiogram 08/06/2019:  1. Left ventricular ejection fraction, by  estimation, is 50 to 55%. The  left ventricle has low normal function. The left ventricle has no regional  wall motion abnormalities. There is moderate left ventricular hypertrophy.  Left ventricular diastolic  parameters are consistent with Grade I diastolic dysfunction (impaired  relaxation).   2. Right ventricular systolic function is normal. The right ventricular  size is normal.   3. The mitral valve is abnormal. Trivial mitral valve regurgitation.   4. The aortic valve is tricuspid. Aortic valve regurgitation is not  visualized.  Mild aortic valve sclerosis is present, with no evidence of  aortic valve stenosis.    Cardiac monitor 09/20/2019: ZIO XT reviewed.  5 days 23 hours analyzed.  Predominant rhythm is sinus with heart rate ranging from 47 bpm up to 100 bpm and average heart rate 62 bpm.  There were occasional PACs representing 2.5% of total beats and occasional PVCs representing 3% of total beats.  Also relatively brief episodes of SVT, the longest of which lasted for 14 beats.  No sustained arrhythmias or pauses.  CLINICAL DATA:  Chest pain   EXAM: Cardiac/Coronary CTA   TECHNIQUE: A non-contrast, gated CT scan was obtained with axial slices of 3 mm through the heart for calcium scoring. Calcium scoring was performed using the Agatston method. A 120 kV prospective, gated, contrast cardiac scan was obtained. Gantry rotation speed was 250 msecs and collimation was 0.6 mm. Two sublingual nitroglycerin tablets (0.8 mg) were given. The 3D data set was reconstructed in 5% intervals of the 35-75% of the R-R cycle. Diastolic phases were analyzed on a dedicated workstation using MPR, MIP, and VRT modes. The patient received 95 cc of contrast.   FINDINGS: Image quality: Excellent.   Noise artifact is: Limited.   Coronary Arteries:  Normal coronary origin.  Right dominance.   Left main: The left main is a large caliber vessel with a normal take off from the left coronary cusp  that trifurcates into a LAD, LCX, and ramus intermedius. There is minimal calcified plaque (<25%).   Left anterior descending artery: The proximal LAD is subtotally occluded (100%) after the first diagonal. The first diagonal contains mild calcified plaque (<25%).   Ramus intermedius: Patent with no evidence of plaque or stenosis.   Left circumflex artery: The LCX is non-dominant. The proximal segment contains mild calcified plaque (25-49%). The LCX gives off 1 patent obtuse marginal branch.   Right coronary artery: The RCA is dominant with normal take off from the right coronary cusp. There is minimal calcified plaque (<25%) in the proximal and mid segments. The distal RCA contains mild calcified plaque (25-49%). The RCA terminates as a PDA and right posterolateral branch. The PDA is patent. The PLV contains minimal calcified plaque (<25%).   Right Atrium: Right atrial size is within normal limits.   Right Ventricle: The right ventricular cavity is within normal limits.   Left Atrium: Left atrial size is normal in size with no left atrial appendage filling defect.   Left Ventricle: The ventricular cavity size is within normal limits.   Pulmonary arteries: Normal in size without proximal filling defect.   Pulmonary veins: Normal pulmonary venous drainage.   Pericardium: Normal thickness without significant effusion or calcium present.   Cardiac valves: The aortic valve is trileaflet without significant calcification. The mitral valve is normal without significant calcification.   Aorta: Normal caliber without significant disease.   Extra-cardiac findings: See attached radiology report for non-cardiac structures.   IMPRESSION: 1. Coronary calcium score of 1435. This was 75th percentile for age-, sex, and race-matched controls.   2. Normal coronary origin with right dominance.   3. Subtotal occlusion of the proximal LAD (100%).   4. Mild CAD (25-49%) in the  LCX/RCA.   RECOMMENDATIONS: 1. Total coronary occlusion (100%). Cardiac catheterization recommended.   Eleonore Chiquito, MD     Electronically Signed   By: Eleonore Chiquito M.D.   On: 10/25/2021 15:22  CORONARY STENT INTERVENTION  LEFT HEART CATH AND CORONARY ANGIOGRAPHY   Conclusion  Ost LM to Mid LM lesion is 20% stenosed.   Prox LAD to Mid LAD lesion is 100% stenosed.   Ost Cx to Prox Cx lesion is 50% stenosed.   RPAV lesion is 30% stenosed.   Mid RCA to Dist RCA lesion is 20% stenosed.   Post intervention, there is a 100% residual stenosis.   The left ventricular systolic function is normal.   LV end diastolic pressure is mildly elevated.   The left ventricular ejection fraction is 55-65% by visual estimate.   Single vessel occlusive CAD with CTO of the mid LAD immediately after the first diagonal. There are good right to left collaterals from the RCA. The LAD is a large vessel Normal LV function. Mildly elevated LVEDP 22 mm Hg Unsuccessful PCI of the LAD - unable to cross with lesion with a wire.   Plan: will DC home today. Continue medical therapy. Will consider bringing him back for dedicated CTO PCI.   Coronary Diagrams  Diagnostic Dominance: Right  Intervention    ASSESSMENT AND PLAN:  1.  CAD with angina pectoris. Coronary CTA indicates occlusion of the LAD. This is confirmed on cardiac cath. Otherwise nonobstructive CAD. LV function is normal so he has benefited from collaterals from the RCA. We discussed options for treatment including medical therapy versus CTO PCI. From an intervention standpoint it appears feasible. I reviewed with him chance of success of 75-80%. The procedure and risks were reviewed including but not limited to death, myocardial infarction, perforation, stroke, arrythmias, bleeding, transfusion, emergency surgery, dye allergy, or renal dysfunction. The patient voices understanding and is agreeable to proceed. Will arrange for July 12th.  Patient is planning to get married on July 15th but would like to have this taken care of before going on his honeymoon on Aug 9. This is the only date that would work.   2. HLD on statin. LDL at goal 62.   3. HTN   Current medicines are reviewed at length with the patient today.  The patient does not have concerns regarding medicines.  The following changes have been made:  no change  Labs/ tests ordered today include:   No orders of the defined types were placed in this encounter.        Disposition:   FU post PCI  Signed, Leandrea Ackley Martinique, MD  10/31/2021 10:20 AM    [facility] [address] [place], [place], [address] Phone [phone], Fax [phone]

## 2021-11-08 ENCOUNTER — Ambulatory Visit: Payer: Medicare Other | Admitting: Adult Health

## 2021-11-11 ENCOUNTER — Telehealth: Payer: Self-pay | Admitting: *Deleted

## 2021-11-11 ENCOUNTER — Ambulatory Visit: Payer: Medicare Other | Admitting: Cardiology

## 2021-11-11 NOTE — Telephone Encounter (Signed)
CTO scheduled at [facility] for: Wednesday November 13, 2021 8:30 AM Arrival time and place: [place] Entrance A at: 6:30 AM   Nothing to eat after midnight prior to procedure, clear liquids until 5 AM day of procedure.  Medication instructions: -Usual morning medications can be taken with sips of water including aspirin 81 mg and Plavix 75 mg.  Confirmed patient has responsible adult to drive home post procedure and be with patient first 24 hours after arriving home.  Patient reports no new symptoms concerning for COVID-19 in the past 10 days.  Reviewed procedure instructions with patient.

## 2021-11-13 ENCOUNTER — Other Ambulatory Visit: Payer: Self-pay

## 2021-11-13 ENCOUNTER — Encounter (HOSPITAL_COMMUNITY)
Admission: RE | Disposition: A | Discharge status: Home / Self Care | Payer: Self-pay | Source: Home / Self Care | Attending: Cardiology

## 2021-11-13 ENCOUNTER — Ambulatory Visit (HOSPITAL_COMMUNITY)
Admission: RE | Admit: 2021-11-13 | Discharge: 2021-11-14 | Disposition: A | Discharge status: Home / Self Care | Payer: Medicare Other | Attending: Cardiology | Admitting: Cardiology

## 2021-11-13 DIAGNOSIS — I2584 Coronary atherosclerosis due to calcified coronary lesion: Secondary | ICD-10-CM | POA: Insufficient documentation

## 2021-11-13 DIAGNOSIS — I2582 Chronic total occlusion of coronary artery: Secondary | ICD-10-CM | POA: Insufficient documentation

## 2021-11-13 DIAGNOSIS — E785 Hyperlipidemia, unspecified: Secondary | ICD-10-CM | POA: Insufficient documentation

## 2021-11-13 DIAGNOSIS — I2510 Atherosclerotic heart disease of native coronary artery without angina pectoris: Secondary | ICD-10-CM | POA: Diagnosis present

## 2021-11-13 DIAGNOSIS — I25119 Atherosclerotic heart disease of native coronary artery with unspecified angina pectoris: Secondary | ICD-10-CM | POA: Diagnosis not present

## 2021-11-13 DIAGNOSIS — I10 Essential (primary) hypertension: Secondary | ICD-10-CM | POA: Diagnosis not present

## 2021-11-13 DIAGNOSIS — I25118 Atherosclerotic heart disease of native coronary artery with other forms of angina pectoris: Secondary | ICD-10-CM

## 2021-11-13 DIAGNOSIS — Z8673 Personal history of transient ischemic attack (TIA), and cerebral infarction without residual deficits: Secondary | ICD-10-CM | POA: Diagnosis not present

## 2021-11-13 DIAGNOSIS — D500 Iron deficiency anemia secondary to blood loss (chronic): Secondary | ICD-10-CM | POA: Diagnosis not present

## 2021-11-13 DIAGNOSIS — M1990 Unspecified osteoarthritis, unspecified site: Secondary | ICD-10-CM | POA: Diagnosis not present

## 2021-11-13 DIAGNOSIS — I209 Angina pectoris, unspecified: Secondary | ICD-10-CM

## 2021-11-13 DIAGNOSIS — Z79899 Other long term (current) drug therapy: Secondary | ICD-10-CM | POA: Diagnosis not present

## 2021-11-13 DIAGNOSIS — Z955 Presence of coronary angioplasty implant and graft: Secondary | ICD-10-CM

## 2021-11-13 HISTORY — PX: CORONARY STENT INTERVENTION: CATH118234

## 2021-11-13 HISTORY — PX: CORONARY CTO INTERVENTION: CATH118236

## 2021-11-13 HISTORY — PX: INTRAVASCULAR ULTRASOUND/IVUS: CATH118244

## 2021-11-13 LAB — POCT ACTIVATED CLOTTING TIME
Activated Clotting Time: 395 seconds
Activated Clotting Time: 257 seconds
Activated Clotting Time: 347 seconds
Activated Clotting Time: 269 seconds
Activated Clotting Time: 293 seconds

## 2021-11-13 LAB — CBC
HCT: 43 % (ref 39.0–52.0)
Hemoglobin: 14.9 g/dL (ref 13.0–17.0)
MCH: 33.3 pg (ref 26.0–34.0)
MCHC: 34.7 g/dL (ref 30.0–36.0)
MCV: 96 fL (ref 80.0–100.0)
Platelets: 114 10*3/uL — ABNORMAL LOW (ref 150–400)
RBC: 4.48 MIL/uL (ref 4.22–5.81)
RDW: 12.2 % (ref 11.5–15.5)
WBC: 5.9 10*3/uL (ref 4.0–10.5)
nRBC: 0 % (ref 0.0–0.2)

## 2021-11-13 LAB — BASIC METABOLIC PANEL
Anion gap: 7 (ref 5–15)
BUN: 19 mg/dL (ref 8–23)
CO2: 23 mmol/L (ref 22–32)
Calcium: 9.4 mg/dL (ref 8.9–10.3)
Chloride: 110 mmol/L (ref 98–111)
Creatinine, Ser: 1.08 mg/dL (ref 0.61–1.24)
GFR, Estimated: >60 mL/min (ref >60)
Glucose, Bld: 106 mg/dL — ABNORMAL HIGH (ref 70–99)
Potassium: 3.9 mmol/L (ref 3.5–5.1)
Sodium: 140 mmol/L (ref 135–145)

## 2021-11-13 SURGERY — CORONARY CTO INTERVENTION
Anesthesia: LOCAL

## 2021-11-13 MED ORDER — ACETAMINOPHEN 325 MG PO TABS: 650.0000 mg | ORAL_TABLET | ORAL | Status: DC | PRN

## 2021-11-13 MED ORDER — SODIUM CHLORIDE 0.9% FLUSH: 3.0000 mL | INTRAVENOUS | Status: DC | PRN

## 2021-11-13 MED ORDER — LABETALOL HCL 5 MG/ML IV SOLN: 10.0000 mg | INTRAVENOUS | Status: AC | PRN

## 2021-11-13 MED ORDER — HEPARIN (PORCINE) IN NACL 1000-0.9 UT/500ML-% IV SOLN
INTRAVENOUS | Status: DC | PRN
  Administered 2021-11-13 (×4): 500 mL

## 2021-11-13 MED ORDER — CLOPIDOGREL BISULFATE 75 MG PO TABS: 75.0000 mg | ORAL_TABLET | Freq: Every day | ORAL | Status: DC

## 2021-11-13 MED ORDER — SODIUM CHLORIDE 0.9% FLUSH
3.0000 mL | Freq: Two times a day (BID) | INTRAVENOUS | Status: DC
  Administered 2021-11-13: 3 mL via INTRAVENOUS

## 2021-11-13 MED ORDER — NITROGLYCERIN 1 MG/10 ML FOR IR/CATH LAB
INTRA_ARTERIAL | Status: AC
  Filled 2021-11-13: qty 10

## 2021-11-13 MED ORDER — HEPARIN SODIUM (PORCINE) 1000 UNIT/ML IJ SOLN
INTRAMUSCULAR | Status: DC | PRN
  Administered 2021-11-13: 8000 [IU] via INTRAVENOUS
  Administered 2021-11-13: 3000 [IU] via INTRAVENOUS
  Administered 2021-11-13: 2000 [IU] via INTRAVENOUS

## 2021-11-13 MED ORDER — ASPIRIN 81 MG PO TBEC
81.0000 mg | DELAYED_RELEASE_TABLET | Freq: Every day | ORAL | Status: DC
  Administered 2021-11-14: 81 mg via ORAL
  Filled 2021-11-13: qty 1

## 2021-11-13 MED ORDER — NITROGLYCERIN 1 MG/10 ML FOR IR/CATH LAB
INTRA_ARTERIAL | Status: DC | PRN
  Administered 2021-11-13 (×4): 200 ug via INTRACORONARY

## 2021-11-13 MED ORDER — HEPARIN (PORCINE) IN NACL 1000-0.9 UT/500ML-% IV SOLN
INTRAVENOUS | Status: AC
  Filled 2021-11-13: qty 1000

## 2021-11-13 MED ORDER — IOHEXOL 350 MG/ML SOLN
INTRAVENOUS | Status: DC | PRN
  Administered 2021-11-13: 150 mL

## 2021-11-13 MED ORDER — PANTOPRAZOLE SODIUM 40 MG PO TBEC
40.0000 mg | DELAYED_RELEASE_TABLET | Freq: Every day | ORAL | Status: DC
  Administered 2021-11-14: 40 mg via ORAL
  Filled 2021-11-13: qty 1

## 2021-11-13 MED ORDER — LIDOCAINE HCL (PF) 1 % IJ SOLN
INTRAMUSCULAR | Status: AC
  Filled 2021-11-13: qty 30

## 2021-11-13 MED ORDER — SODIUM CHLORIDE 0.9 % WEIGHT BASED INFUSION
3.0000 mL/kg/h | INTRAVENOUS | Status: DC
  Administered 2021-11-13: 3 mL/kg/h via INTRAVENOUS

## 2021-11-13 MED ORDER — MIDAZOLAM HCL 2 MG/2ML IJ SOLN
INTRAMUSCULAR | Status: DC | PRN
  Administered 2021-11-13 (×2): 1 mg via INTRAVENOUS

## 2021-11-13 MED ORDER — ONDANSETRON HCL 4 MG/2ML IJ SOLN: 4.0000 mg | Freq: Four times a day (QID) | INTRAMUSCULAR | Status: DC | PRN

## 2021-11-13 MED ORDER — NITROGLYCERIN 0.4 MG SL SUBL: 0.4000 mg | SUBLINGUAL_TABLET | SUBLINGUAL | Status: DC | PRN

## 2021-11-13 MED ORDER — SODIUM CHLORIDE 0.9 % IV SOLN
INTRAVENOUS | Status: AC
  Administered 2021-11-13: 100 mL/h via INTRAVENOUS

## 2021-11-13 MED ORDER — MEMANTINE HCL 10 MG PO TABS
10.0000 mg | ORAL_TABLET | Freq: Two times a day (BID) | ORAL | Status: DC
  Filled 2021-11-13 (×2): qty 1

## 2021-11-13 MED ORDER — CLOPIDOGREL BISULFATE 75 MG PO TABS
75.0000 mg | ORAL_TABLET | Freq: Every day | ORAL | Status: DC
  Administered 2021-11-14: 75 mg via ORAL
  Filled 2021-11-13: qty 1

## 2021-11-13 MED ORDER — FENTANYL CITRATE (PF) 100 MCG/2ML IJ SOLN
INTRAMUSCULAR | Status: DC | PRN
  Administered 2021-11-13 (×2): 25 ug via INTRAVENOUS

## 2021-11-13 MED ORDER — MAGNESIUM OXIDE -MG SUPPLEMENT 400 (240 MG) MG PO TABS
400.0000 mg | ORAL_TABLET | Freq: Every day | ORAL | Status: DC
  Administered 2021-11-14: 400 mg via ORAL
  Filled 2021-11-13: qty 1

## 2021-11-13 MED ORDER — MORPHINE SULFATE (PF) 2 MG/ML IV SOLN
INTRAVENOUS | Status: AC
  Filled 2021-11-13: qty 1

## 2021-11-13 MED ORDER — MAGNESIUM GLYCINATE 100 MG PO CAPS: 100.0000 mg | ORAL_CAPSULE | Freq: Every day | ORAL | Status: DC

## 2021-11-13 MED ORDER — CELECOXIB 400 MG PO CAPS
400.0000 mg | ORAL_CAPSULE | Freq: Every day | ORAL | Status: DC
  Filled 2021-11-13: qty 1

## 2021-11-13 MED ORDER — SODIUM CHLORIDE 0.9 % IV SOLN: 250.0000 mL | INTRAVENOUS | Status: DC | PRN

## 2021-11-13 MED ORDER — TEMAZEPAM 7.5 MG PO CAPS
30.0000 mg | ORAL_CAPSULE | Freq: Every evening | ORAL | Status: DC | PRN
  Administered 2021-11-13: 30 mg via ORAL
  Filled 2021-11-13: qty 4

## 2021-11-13 MED ORDER — ATORVASTATIN CALCIUM 40 MG PO TABS
40.0000 mg | ORAL_TABLET | Freq: Every day | ORAL | Status: DC
  Administered 2021-11-14: 40 mg via ORAL
  Filled 2021-11-13: qty 1

## 2021-11-13 MED ORDER — AMLODIPINE BESYLATE 10 MG PO TABS
10.0000 mg | ORAL_TABLET | Freq: Every day | ORAL | Status: DC
  Administered 2021-11-14: 10 mg via ORAL
  Filled 2021-11-13: qty 1

## 2021-11-13 MED ORDER — HEPARIN SODIUM (PORCINE) 1000 UNIT/ML IJ SOLN
INTRAMUSCULAR | Status: AC
  Filled 2021-11-13: qty 10

## 2021-11-13 MED ORDER — HEPARIN (PORCINE) IN NACL 1000-0.9 UT/500ML-% IV SOLN
INTRAVENOUS | Status: AC
  Filled 2021-11-13: qty 500

## 2021-11-13 MED ORDER — LIDOCAINE HCL (PF) 1 % IJ SOLN
INTRAMUSCULAR | Status: DC | PRN
  Administered 2021-11-13 (×2): 12 mL

## 2021-11-13 MED ORDER — MIDAZOLAM HCL 2 MG/2ML IJ SOLN
INTRAMUSCULAR | Status: AC
  Filled 2021-11-13: qty 2

## 2021-11-13 MED ORDER — ASPIRIN 81 MG PO CHEW: 81.0000 mg | CHEWABLE_TABLET | ORAL | Status: DC

## 2021-11-13 MED ORDER — MORPHINE SULFATE (PF) 2 MG/ML IV SOLN
2.0000 mg | Freq: Once | INTRAVENOUS | Status: AC
  Administered 2021-11-13: 2 mg via INTRAVENOUS

## 2021-11-13 MED ORDER — METOPROLOL TARTRATE 50 MG PO TABS
50.0000 mg | ORAL_TABLET | Freq: Two times a day (BID) | ORAL | Status: DC
  Administered 2021-11-14: 50 mg via ORAL
  Filled 2021-11-13 (×2): qty 1

## 2021-11-13 MED ORDER — FENTANYL CITRATE (PF) 100 MCG/2ML IJ SOLN
INTRAMUSCULAR | Status: AC
  Filled 2021-11-13: qty 2

## 2021-11-13 MED ORDER — SODIUM CHLORIDE 0.9 % WEIGHT BASED INFUSION: 1.0000 mL/kg/h | INTRAVENOUS | Status: DC

## 2021-11-13 MED ORDER — HYDRALAZINE HCL 20 MG/ML IJ SOLN: 10.0000 mg | INTRAMUSCULAR | Status: AC | PRN

## 2021-11-13 SURGICAL SUPPLY — 33 items
BALLN EMERGE MR 2.0X15 (BALLOONS) ×2
BALLN EMERGE MR 3.0X15 (BALLOONS) ×2
BALLN WOLVERINE 2.50X15 (BALLOONS) ×2
BALLN ~~LOC~~ EMERGE MR 3.0X12 (BALLOONS) ×2
BALLOON EMERGE MR 2.0X15 (BALLOONS) IMPLANT
BALLOON EMERGE MR 3.0X15 (BALLOONS) IMPLANT
BALLOON WOLVERINE 2.50X15 (BALLOONS) IMPLANT
BALLOON ~~LOC~~ EMERGE MR 3.0X12 (BALLOONS) IMPLANT
CATH INFINITI JR4 5F (CATHETERS) ×1 IMPLANT
CATH MACH1 8F CLS4 (CATHETERS) ×1 IMPLANT
CATH MAMBA 135 (CATHETERS) ×1 IMPLANT
CATH OPTICROSS HD (CATHETERS) ×1 IMPLANT
ELECT DEFIB PAD ADLT CADENCE (PAD) ×1 IMPLANT
GUIDEWIRE JUDO 3 190 (WIRE) ×1 IMPLANT
GUIDEWIRE JUDO 6 190 (WIRE) ×1 IMPLANT
KIT ENCORE 26 ADVANTAGE (KITS) ×1 IMPLANT
KIT HEART LEFT (KITS) ×2 IMPLANT
PACK CARDIAC CATHETERIZATION (CUSTOM PROCEDURE TRAY) ×2 IMPLANT
SHEATH BRITE TIP 8FR 35CM (SHEATH) ×1 IMPLANT
SHEATH PINNACLE 5F 10CM (SHEATH) ×1 IMPLANT
SHEATH PROBE COVER 6X72 (BAG) ×1 IMPLANT
SLED PULL BACK IVUS (MISCELLANEOUS) ×1 IMPLANT
STENT SYNERGY XD 2.75X28 (Permanent Stent) IMPLANT
STENT SYNERGY XD 3.0X38 (Permanent Stent) IMPLANT
SYNERGY XD 2.75X28 (Permanent Stent) ×2 IMPLANT
SYNERGY XD 3.0X38 (Permanent Stent) ×2 IMPLANT
TRANSDUCER W/STOPCOCK (MISCELLANEOUS) ×2 IMPLANT
TUBING CIL FLEX 10 FLL-RA (TUBING) ×2 IMPLANT
VALVE GUARDIAN II ~~LOC~~ HEMO (MISCELLANEOUS) ×1 IMPLANT
WIRE ASAHI CONFIANZPRO12 300CM (WIRE) ×1 IMPLANT
WIRE ASAHI PROWATER 180CM (WIRE) ×1 IMPLANT
WIRE EMERALD 3MM-J .035X150CM (WIRE) ×1 IMPLANT
WIRE FIGHTER CROSSING 190CM (WIRE) ×1 IMPLANT

## 2021-11-13 NOTE — Interval H&P Note (Signed)
History and Physical Interval Note:  11/13/2021 7:19 AM  Philip Allen  has presented today for surgery, with the diagnosis of cto.  The various methods of treatment have been discussed with the patient and family. After consideration of risks, benefits and other options for treatment, the patient has consented to  Procedure(s): CORONARY CTO INTERVENTION (N/A) as a surgical intervention.  The patient's history has been reviewed, patient examined, no change in status, stable for surgery.  I have reviewed the patient's chart and labs.  Questions were answered to the patient's satisfaction.   Cath Lab Visit (complete for each Cath Lab visit)  Clinical Evaluation Leading to the Procedure:   ACS: No.  Non-ACS:    Anginal Classification: CCS II  Anti-ischemic medical therapy: Maximal Therapy (2 or more classes of medications)  Non-Invasive Test Results: Intermediate-risk stress test findings: cardiac mortality 1-3%/year  Prior CABG: No previous CABG        [facility] 11/13/2021 7:19 AM

## 2021-11-13 NOTE — Progress Notes (Signed)
Site area: Right groin a 8 french long sheath was removed  Site Prior to Removal:  Level 0  Pressure Applied For 30 MINUTES    Bedrest Beginning at 1530X 4 hours  Manual:   Yes.    Patient Status During Pull:  stable  Post Pull Groin Site:  Level 0  Post Pull Instructions Given:  Yes.    Post Pull Pulses Present:  Yes.    Dressing Applied:  Yes.    Comments:

## 2021-11-13 NOTE — Progress Notes (Signed)
Site area: Left groin a 5 french arterial sheath was removed  Site Prior to Removal:  Level 0  Pressure Applied For 25 MINUTES    Bedrest Beginning at 1530pm X 4 hours  Manual:   Yes.    Patient Status During Pull:  stable  Post Pull Groin Site:  Level 1  Post Pull Instructions Given:  Yes.    Post Pull Pulses Present:  Yes.    Dressing Applied:  Yes.    Comments:

## 2021-11-14 ENCOUNTER — Encounter (HOSPITAL_COMMUNITY): Payer: Self-pay | Admitting: Cardiology

## 2021-11-14 ENCOUNTER — Other Ambulatory Visit: Payer: Self-pay

## 2021-11-14 ENCOUNTER — Other Ambulatory Visit (HOSPITAL_COMMUNITY): Payer: Self-pay

## 2021-11-14 DIAGNOSIS — E785 Hyperlipidemia, unspecified: Secondary | ICD-10-CM | POA: Diagnosis not present

## 2021-11-14 DIAGNOSIS — I209 Angina pectoris, unspecified: Secondary | ICD-10-CM

## 2021-11-14 DIAGNOSIS — M1990 Unspecified osteoarthritis, unspecified site: Secondary | ICD-10-CM | POA: Diagnosis not present

## 2021-11-14 DIAGNOSIS — I10 Essential (primary) hypertension: Secondary | ICD-10-CM

## 2021-11-14 DIAGNOSIS — Z955 Presence of coronary angioplasty implant and graft: Secondary | ICD-10-CM | POA: Diagnosis not present

## 2021-11-14 DIAGNOSIS — Z8673 Personal history of transient ischemic attack (TIA), and cerebral infarction without residual deficits: Secondary | ICD-10-CM | POA: Diagnosis not present

## 2021-11-14 DIAGNOSIS — I25118 Atherosclerotic heart disease of native coronary artery with other forms of angina pectoris: Secondary | ICD-10-CM | POA: Diagnosis not present

## 2021-11-14 DIAGNOSIS — Z79899 Other long term (current) drug therapy: Secondary | ICD-10-CM | POA: Diagnosis not present

## 2021-11-14 DIAGNOSIS — I2582 Chronic total occlusion of coronary artery: Secondary | ICD-10-CM | POA: Diagnosis not present

## 2021-11-14 DIAGNOSIS — D500 Iron deficiency anemia secondary to blood loss (chronic): Secondary | ICD-10-CM | POA: Diagnosis not present

## 2021-11-14 DIAGNOSIS — I25119 Atherosclerotic heart disease of native coronary artery with unspecified angina pectoris: Secondary | ICD-10-CM | POA: Diagnosis not present

## 2021-11-14 DIAGNOSIS — I2584 Coronary atherosclerosis due to calcified coronary lesion: Secondary | ICD-10-CM | POA: Diagnosis not present

## 2021-11-14 LAB — CBC
HCT: 34.8 % — ABNORMAL LOW (ref 39.0–52.0)
Hemoglobin: 12.3 g/dL — ABNORMAL LOW (ref 13.0–17.0)
MCH: 33.6 pg (ref 26.0–34.0)
MCHC: 35.3 g/dL (ref 30.0–36.0)
MCV: 95.1 fL (ref 80.0–100.0)
Platelets: 93 10*3/uL — ABNORMAL LOW (ref 150–400)
RBC: 3.66 MIL/uL — ABNORMAL LOW (ref 4.22–5.81)
RDW: 12.2 % (ref 11.5–15.5)
WBC: 6.6 10*3/uL (ref 4.0–10.5)
nRBC: 0 % (ref 0.0–0.2)

## 2021-11-14 LAB — POCT ACTIVATED CLOTTING TIME
Activated Clotting Time: 203 seconds
Activated Clotting Time: 179 seconds

## 2021-11-14 LAB — BASIC METABOLIC PANEL
Anion gap: 5 (ref 5–15)
BUN: 15 mg/dL (ref 8–23)
CO2: 23 mmol/L (ref 22–32)
Calcium: 8.6 mg/dL — ABNORMAL LOW (ref 8.9–10.3)
Chloride: 110 mmol/L (ref 98–111)
Creatinine, Ser: 0.97 mg/dL (ref 0.61–1.24)
GFR, Estimated: >60 mL/min (ref >60)
Glucose, Bld: 96 mg/dL (ref 70–99)
Potassium: 4 mmol/L (ref 3.5–5.1)
Sodium: 138 mmol/L (ref 135–145)

## 2021-11-14 MED ORDER — ATORVASTATIN CALCIUM 80 MG PO TABS
80.0000 mg | ORAL_TABLET | Freq: Every day | ORAL | 3 refills | Status: DC
  Filled 2021-11-14: qty 30, 30d supply, fill #0

## 2021-11-14 NOTE — Progress Notes (Signed)
CARDIAC REHAB PHASE I   PRE:  Rate/Rhythm: 58 NSR  BP:  Sitting: 131/66      SaO2: 96 RA  MODE:  Ambulation: 470 ft   POST:  Rate/Rhythm: 96 NSR  BP:  Sitting: 153/68      SaO2: 96 RA   Seen pt from 0800-0830 pt was ambulated through hallway with contact guard assistance. Pt was asymptomatic throughout ambulation. Pt was returned to room and educated on stent procedure, restrictions, Aspirin & Plavix med use, ex guidelines, NTG use, heart healthy diet, and CRPII. Pt declined CRPII at [facility] b[facility]c [facility]   [name]  8:31 AM 11/14/2021

## 2021-11-14 NOTE — Plan of Care (Signed)
°  Problem: Activity: °Goal: Ability to return to baseline activity level will improve °Outcome: Progressing °  °Problem: Cardiovascular: °Goal: Ability to achieve and maintain adequate cardiovascular perfusion will improve °Outcome: Progressing °  °

## 2021-11-14 NOTE — Discharge Summary (Addendum)
The patient has been seen in conjunction with Philip Sharp, PA-C. All aspects of care have been considered and discussed. The patient has been personally interviewed, examined, and all clinical data has been reviewed.  He is ambulated without chest discomfort or issues with bilateral femoral access sites.  Has some mild ecchymosis around the left femoral but no hematoma. This morning is 12.3 down from 14.9 preprocedure. Plan discharge today on aspirin and Plavix. Follow-up with Philip. [place] prior to his trip to [place] in 3 weeks.  She will see Philip. [place] in 7 to 10 days.   Discharge Summary    Patient ID: Philip Allen MRN: [id number]; DOB: 03-26-1941  Admit date: 11/13/2021 Discharge date: 11/14/2021  PCP:  Philip Squibb, MD   [facility] Providers Cardiologist:  Philip Lesches, MD   Discharge Diagnoses    Principal Problem:   CAD (coronary artery disease) Active Problems:   Angina pectoris ([place])   Hypertension   Hyperlipidemia with target LDL less than 70   Osteoarthritis    Diagnostic Studies/Procedures    Coronary stent intervention 11/13/21:   Prox LAD to Mid LAD lesion is 100% stenosed.   A drug-eluting stent was successfully placed using a SYNERGY XD 2.75X28.   A drug-eluting stent was successfully placed using a SYNERGY XD 3.0X38.   Post intervention, there is a 0% residual stenosis.   Successful CTO PCI of the mid to distal LAD using IVUS guidance and overlapping DES x 2.   Plan: observe overnight. Anticipate DC in am. Plan DAPT for one year.  _____________   History of Present Illness     Philip Allen is a 81 y.o. male who was referred to Philip. [place] following a high risk coronary CTA and heart catheterization showing CTO of LAD.   He has been evaluated by Philip Allen in the past for syncope. Evaluation in 2021 showed unremarkable Echo. Event monitor showed PACs and PVCs. Managed medically. He has a history of CVA, HTN, HLD and ED. He was seen in the  emergency room on 10/05/2021 for complaints of chest heaviness located in the center of his chest with shortness of breath.  He felt weakness and was unable to stand so he laid down on the ground but never lost consciousness.  He reported that it lasted approximately 10 minutes.  His wife provided him 4 baby aspirin and symptoms resolved after 10 minutes however he was seen in the ED for further evaluation.  EKG did not show evidence of ACS, it revealed sinus rhythm with borderline intraventricular conduction delay similar to prior EKG in 2021.  He declined admission, and he was found to have 2 negative cardiac enzymes during observation in ED. He was seen by Philip Sims NP in the office on 10/08/21 and scheduled for coronary CTA. This showed a calcium score of 1435 and subtotal occlusion of the LAD. Nonobstructive disease in other vessels. It was recommended he be seen to consider cardiac cath. On follow up today he states since his episode 2 weeks ago he hasn't had any more pain but has not been doing anything.    He did undergo cardiac cath 2 days ago demonstrating occlusion of the LAD after the first diagonal. It fills by right to left collaterals from the RCA. We attempted to cross but were unable to cross with a prowater or [employer] 3 wire. He is seen today to discuss CTO PCI. No problems from his cath procedure.   Hospital Course  Consultants:   CAD PCI to LAD CTO successfully performed with DES x 2 on 11/13/21. He tolerated the procedure well. He was started on DAPT with ASA and plavix. Continue BB and statin as below.    Hyperlipidemia with LDL goal < 55 10/28/2021: Cholesterol, Total 135; HDL 54; LDL Chol Calc (NIH) 62; Triglycerides 105 On 40 mg lipitor, consider increasing this to 80 mg.    Hypertension Maintained on amlodipine, and metoprolol 50 mg BID. Continue these.   Expected blood loss anemia Hb 14.9 --> 12.3 Groin sites stable bilaterally   OA Have advised  stopping Celebrex. He may take tylenol.   He is getting married in 2 days and will be going to [place] in 2 weeks. He assures me he will "take it easy." Follow up as scheduled.   Did the patient have an acute coronary syndrome (MI, NSTEMI, STEMI, etc) this admission?:  No                               Did the patient have a percutaneous coronary intervention (stent / angioplasty)?:  Yes.     Cath/PCI Registry Performance & Quality Measures: Aspirin prescribed? - Yes ADP Receptor Inhibitor (Plavix/Clopidogrel, Brilinta/Ticagrelor or Effient/Prasugrel) prescribed (includes medically managed patients)? - Yes High Intensity Statin (Lipitor 40-'80mg'$  or Crestor 20-'40mg'$ ) prescribed? - Yes For EF <40%, was ACEI/ARB prescribed? - Not Applicable (EF >/= 01%) For EF <40%, Aldosterone Antagonist (Spironolactone or Eplerenone) prescribed? - Not Applicable (EF >/= 09%) Cardiac Rehab Phase II ordered? - Yes       The patient will be scheduled for a TOC follow up appointment in 7-10 days.  A message has been sent to the [facility] and Scheduling Pool at the office where the patient should be seen for follow up.  _____________  Discharge Vitals Blood pressure 131/66, pulse (!) 52, temperature 97.8 F (36.6 C), temperature source Oral, resp. rate 18, height '5\' 6"'$  (1.676 m), weight 79.4 kg, SpO2 97 %.  Filed Weights   11/13/21 0704  Weight: 79.4 kg   Physical Exam Constitutional:      Appearance: Normal appearance.  Eyes:     Extraocular Movements: Extraocular movements intact.  Neck:     Vascular: No carotid bruit.  Cardiovascular:     Rate and Rhythm: Normal rate and regular rhythm.     Heart sounds: No murmur heard. Pulmonary:     Effort: Pulmonary effort is normal.     Breath sounds: Normal breath sounds.  Abdominal:     General: Abdomen is flat.     Palpations: Abdomen is soft.  Musculoskeletal:     Right lower leg: No edema.     Left lower leg: No edema.  Skin:    General: Skin is  warm and dry.  Neurological:     Mental Status: He is alert and oriented to person, place, and time.  Psychiatric:        Mood and Affect: Mood normal.        Behavior: Behavior normal.   Groin sites C/D/I, no hematoma    Labs & Radiologic Studies    CBC Recent Labs    11/13/21 0655 11/14/21 0209  WBC 5.9 6.6  HGB 14.9 12.3*  HCT 43.0 34.8*  MCV 96.0 95.1  PLT 114* 93*   Basic Metabolic Panel Recent Labs    11/13/21 0655 11/14/21 0209  NA 140 138  K 3.9 4.0  CL  110 110  CO2 23 23  GLUCOSE 106* 96  BUN 19 15  CREATININE 1.08 0.97  CALCIUM 9.4 8.6*   Liver Function Tests No results for input(s): "AST", "ALT", "ALKPHOS", "BILITOT", "PROT", "ALBUMIN" in the last 72 hours. No results for input(s): "LIPASE", "AMYLASE" in the last 72 hours. High Sensitivity Troponin:   No results for input(s): "TROPONINIHS" in the last 720 hours.  BNP Invalid input(s): "POCBNP" D-Dimer No results for input(s): "DDIMER" in the last 72 hours. Hemoglobin A1C No results for input(s): "HGBA1C" in the last 72 hours. Fasting Lipid Panel No results for input(s): "CHOL", "HDL", "LDLCALC", "TRIG", "CHOLHDL", "LDLDIRECT" in the last 72 hours. Thyroid Function Tests No results for input(s): "TSH", "T4TOTAL", "T3FREE", "THYROIDAB" in the last 72 hours.  Invalid input(s): "FREET3" _____________  CARDIAC CATHETERIZATION  Result Date: 11/13/2021   Prox LAD to Mid LAD lesion is 100% stenosed.   A drug-eluting stent was successfully placed using a SYNERGY XD 2.75X28.   A drug-eluting stent was successfully placed using a SYNERGY XD 3.0X38.   Post intervention, there is a 0% residual stenosis. Successful CTO PCI of the mid to distal LAD using IVUS guidance and overlapping DES x 2. Plan: observe overnight. Anticipate DC in am. Plan DAPT for one year.   CT CORONARY MORPH W/CTA COR W/SCORE W/CA W/CM &/OR WO/CM  Addendum Date: 11/01/2021   ADDENDUM REPORT: 11/01/2021 12:34 : ADDENDUM EXAM: OVER-READ  INTERPRETATION CARDIAC CT CHEST The following report is an over-read performed by radiologist Philip. Van Clines of [facility], [place] on 11/01/2021. This over-read does not include interpretation of cardiac or coronary anatomy or pathology. The coronary CTA interpretation by the cardiologist is attached. COMPARISON:  Report from 02/22/2001 FINDINGS: Extracardiac Vascular: Descending thoracic aortic atherosclerotic calcification. Mediastinum: Circumferential distal esophageal wall thickening is nonspecific. Cannot exclude small hiatal hernia. No distal paraesophageal adenopathy. 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 Included Upper Abdomen: Unremarkable Musculoskeletal: Lower thoracic spondylosis. IMPRESSION: 1. Circumferential distal esophageal wall thickening is nonspecific. Esophagitis would be a common cause, esophageal tumor is a less likely differential diagnostic consideration. This could be further worked up with upper endoscopy if clinically warranted. Cannot exclude a small hiatal hernia. 2. Thoracic aortic atherosclerosis. Aortic Atherosclerosis (ICD10-I70.0). 3. 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 Electronically Signed   By: Van Clines M.D.   On: 11/01/2021 12:34   Result Date: 11/01/2021 CLINICAL DATA:  Chest pain EXAM: Cardiac/Coronary CTA TECHNIQUE: A non-contrast, gated CT scan was obtained with axial slices of 3 mm through the heart for calcium scoring. Calcium scoring was performed using the Agatston method. A 120 kV prospective, gated, contrast cardiac scan was obtained. Gantry rotation speed was 250 msecs and collimation was 0.6 mm. Two sublingual nitroglycerin tablets (0.8 mg) were given. The 3D data set was reconstructed in 5% intervals of the 35-75% of the R-R cycle. Diastolic phases were analyzed on a dedicated workstation using MPR, MIP, and VRT modes. The patient received 95 cc of contrast.  FINDINGS: Image quality: Excellent. Noise artifact is: Limited. Coronary Arteries:  Normal coronary origin.  Right dominance. Left main: The left main is a large caliber vessel with a normal take off from the left coronary cusp that trifurcates into a LAD, LCX, and ramus intermedius. There is minimal calcified plaque (<25%). Left anterior descending artery: The proximal LAD is subtotally occluded (100%) after the first diagonal. The first diagonal contains mild calcified plaque (<25%). Ramus intermedius: Patent with no evidence of  plaque or stenosis. Left circumflex artery: The LCX is non-dominant. The proximal segment contains mild calcified plaque (25-49%). The LCX gives off 1 patent obtuse marginal branch. Right coronary artery: The RCA is dominant with normal take off from the right coronary cusp. There is minimal calcified plaque (<25%) in the proximal and mid segments. The distal RCA contains mild calcified plaque (25-49%). The RCA terminates as a PDA and right posterolateral branch. The PDA is patent. The PLV contains minimal calcified plaque (<25%). Right Atrium: Right atrial size is within normal limits. Right Ventricle: The right ventricular cavity is within normal limits. Left Atrium: Left atrial size is normal in size with no left atrial appendage filling defect. Left Ventricle: The ventricular cavity size is within normal limits. Pulmonary arteries: Normal in size without proximal filling defect. Pulmonary veins: Normal pulmonary venous drainage. Pericardium: Normal thickness without significant effusion or calcium present. Cardiac valves: The aortic valve is trileaflet without significant calcification. The mitral valve is normal without significant calcification. Aorta: Normal caliber without significant disease. Extra-cardiac findings: See attached radiology report for non-cardiac structures. IMPRESSION: 1. Coronary calcium score of 1435. This was 75th percentile for age-, sex, and race-matched  controls. 2. Normal coronary origin with right dominance. 3. Subtotal occlusion of the proximal LAD (100%). 4. Mild CAD (25-49%) in the LCX/RCA. RECOMMENDATIONS: 1. Total coronary occlusion (100%). Cardiac catheterization recommended. Eleonore Chiquito, MD Electronically Signed: By: Eleonore Chiquito M.D. On: 10/25/2021 15:22   CARDIAC CATHETERIZATION  Result Date: 10/29/2021   Ost LM to Mid LM lesion is 20% stenosed.   Prox LAD to Mid LAD lesion is 100% stenosed.   Ost Cx to Prox Cx lesion is 50% stenosed.   RPAV lesion is 30% stenosed.   Mid RCA to Dist RCA lesion is 20% stenosed.   Post intervention, there is a 100% residual stenosis.   The left ventricular systolic function is normal.   LV end diastolic pressure is mildly elevated.   The left ventricular ejection fraction is 55-65% by visual estimate. Single vessel occlusive CAD with CTO of the mid LAD immediately after the first diagonal. There are good right to left collaterals from the RCA. The LAD is a large vessel Normal LV function. Mildly elevated LVEDP 22 mm Hg Unsuccessful PCI of the LAD - unable to cross with lesion with a wire. Plan: will DC home today. Continue medical therapy. Will consider bringing him back for dedicated CTO PCI.    Disposition   Pt is being discharged home today in good condition.  Follow-up Plans & Appointments     Follow-up Information     Philip [facility], Peter M, MD Follow up on 12/04/2021.   Specialty: Cardiology Why: 9:40 am Contact information: [address] [place] [address] [phone]                Discharge Instructions     Amb Referral to Cardiac Rehabilitation   Complete by: As directed    [place]- Pt declined   Diagnosis: Coronary Stents   After initial evaluation and assessments completed: Virtual Based Care may be provided alone or in conjunction with Phase 2 Cardiac Rehab based on patient barriers.: Yes   Diet - low sodium heart healthy   Complete by: As directed     Discharge instructions   Complete by: As directed    No driving for 2 days. No lifting over 5 lbs for 1 week. No sexual activity for 1 week. Keep procedure sites clean & dry.  If you notice increased pain, swelling, bleeding or pus, call/return!  You may shower, but no soaking baths/hot tubs/pools for 1 week.   Increase activity slowly   Complete by: As directed        Discharge Medications   Allergies as of 11/14/2021       Reactions   Naproxen Hives, Other (See Comments)   Reaction to Aleve        Medication List     STOP taking these medications    celecoxib 200 MG capsule Commonly known as: CELEBREX       TAKE these medications    amLODipine 10 MG tablet Commonly known as: NORVASC Take 10 mg by mouth daily.   aspirin EC 81 MG tablet Take 81 mg by mouth daily.   atorvastatin 80 MG tablet Commonly known as: LIPITOR Take 1 tablet (80 mg total) by mouth daily. What changed:  medication strength how much to take   clopidogrel 75 MG tablet Commonly known as: PLAVIX Take 75 mg by mouth daily.   Magnesium 400 MG Tabs Take 400 mg by mouth daily.   Magnesium Glycinate 100 MG Caps Take 100 mg by mouth daily.   memantine 10 MG tablet Commonly known as: NAMENDA Take 10 mg by mouth 2 (two) times daily.   metoprolol tartrate 50 MG tablet Commonly known as: LOPRESSOR Take 1 tablet (50 mg total) by mouth 2 (two) times daily.   nitroGLYCERIN 0.4 MG SL tablet Commonly known as: NITROSTAT Place 1 tablet (0.4 mg total) under the tongue every 5 (five) minutes as needed for chest pain.   pantoprazole 40 MG tablet Commonly known as: PROTONIX Take 40 mg by mouth daily before breakfast.   temazepam 30 MG capsule Commonly known as: RESTORIL Take 30 mg by mouth at bedtime as needed for sleep.           Outstanding Labs/Studies   Follow up appt TOC  Duration of Discharge Encounter   Greater than 30 minutes including physician time.  Signed, Tami Lin Lilliauna Van, PA 11/14/2021, 9:26 AM

## 2021-11-15 LAB — LIPOPROTEIN A (LPA): Lipoprotein (a): 80.4 nmol/L — ABNORMAL HIGH (ref <75.0)

## 2021-11-18 ENCOUNTER — Other Ambulatory Visit: Payer: Self-pay | Admitting: Cardiology

## 2021-11-18 ENCOUNTER — Telehealth: Payer: Self-pay

## 2021-11-18 ENCOUNTER — Ambulatory Visit: Payer: Medicare Other | Admitting: Physician Assistant

## 2021-11-18 ENCOUNTER — Other Ambulatory Visit (HOSPITAL_COMMUNITY): Payer: Self-pay

## 2021-11-18 MED ORDER — METOPROLOL TARTRATE 50 MG PO TABS: 50.0000 mg | ORAL_TABLET | Freq: Two times a day (BID) | ORAL | 3 refills | Status: DC

## 2021-11-18 MED ORDER — NITROGLYCERIN 0.4 MG SL SUBL
0.4000 mg | SUBLINGUAL_TABLET | SUBLINGUAL | 11 refills | Status: DC | PRN
Start: 2021-11-18 — End: 2022-09-09

## 2021-11-18 MED ORDER — METOPROLOL TARTRATE 50 MG PO TABS: 50.0000 mg | ORAL_TABLET | Freq: Two times a day (BID) | ORAL | 0 refills | Status: DC

## 2021-11-18 NOTE — Telephone Encounter (Signed)
Spoke to patient he stated he needs a refill for metoprolol.He stated Magnesium was listed twice on discharge instructions.Advised he should take Magnesium 400 mg daily.Metoprolol and NTG refills sent to pharmacy.

## 2021-11-29 NOTE — Progress Notes (Unsigned)
Cardiology Office Note   Date:  12/04/2021   ID:  [name], DOB 16-May-1940, MRN [id number]  PCP:  Celene Squibb, MD  Cardiologist:  Rozann Lesches MD   Chief Complaint  Patient presents with   Coronary Artery Disease      History of Present Illness: Philip [place] is a 81 y.o. male who presents as a work in following a high risk coronary CTA. He has been evaluated by Dr Domenic Polite in the past for syncope. Evaluation in 2021 showed unremarkable Echo. Event monitor showed PACs and PVCs. Managed medically. He has a history of CVA, HTN, HLD and ED. He was seen in the emergency room on 10/05/2021 for complaints of chest heaviness located in the center of his chest with shortness of breath.  He felt weakness and was unable to stand so he laid down on the ground but never lost consciousness.  He reported that it lasted approximately 10 minutes.  His wife provided him 4 baby aspirin and symptoms resolved after 10 minutes however he was seen in the ED for further evaluation.  EKG did not show evidence of ACS, it revealed sinus rhythm with borderline intraventricular conduction delay similar to prior EKG in 2021.  He declined admission, and he was found to have 2 negative cardiac enzymes during observation in ED. He was seen by [name] NP in the office on 10/08/21 and scheduled for coronary CTA. This showed a calcium score of 1435 and subtotal occlusion of the LAD. Nonobstructive disease in other vessels. It was recommended he be seen to consider cardiac cath. On follow up today he states since his episode 2 weeks ago he hasn't had any more pain but has not been doing anything.   He did undergo cardiac cath demonstrating occlusion of the LAD after the first diagonal. It fills by right to left collaterals from the RCA. We attempted to cross but were unable to cross with a prowater or [employer] 3 wire. He later underwent CTO PCI on 11/13/21 and we able to cross with a [employer] pro 12  wire and the LAD was successfully stented.   On follow up today he is doing well. No chest pain. Had bruising at cath sites but this has improved. Walking regularly. Got remarried and is planning on going on his honeymoon next week to [place].    Past Medical History:  Diagnosis Date   BPH (benign prostatic hypertrophy)    GERD (gastroesophageal reflux disease)    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   Hyperlipidemia    Hypertension    Urgency of urination     Past Surgical History:  Procedure Laterality Date   CORONARY CTO INTERVENTION N/A 11/13/2021   Procedure: CORONARY CTO INTERVENTION;  Surgeon: [place], Deyci Gesell M, MD;  Location: [facility];  Service: Cardiovascular;  Laterality: N/A;   CORONARY STENT INTERVENTION N/A 10/29/2021   Procedure: CORONARY STENT INTERVENTION;  Surgeon: [place], Grizelda Piscopo M, MD;  Location: [facility];  Service: Cardiovascular;  Laterality: N/A;   CORONARY STENT INTERVENTION N/A 11/13/2021   Procedure: CORONARY STENT INTERVENTION;  Surgeon: [place], Avonte Sensabaugh M, MD;  Location: [facility];  Service: Cardiovascular;  Laterality: N/A;   INTRAVASCULAR ULTRASOUND/IVUS N/A 11/13/2021   Procedure: Intravascular Ultrasound/IVUS;  Surgeon: [place], Elba Schaber M, MD;  Location: [facility];  Service: Cardiovascular;  Laterality: N/A;   LEFT HEART CATH AND CORONARY ANGIOGRAPHY N/A 10/29/2021  Procedure: LEFT HEART CATH AND CORONARY ANGIOGRAPHY;  Surgeon: [place], Geralyn Figiel M, MD;  Location: [facility];  Service: Cardiovascular;  Laterality: N/A;   RIGHT SHOULDER SURGERY  1998   TRANSURETHRAL RESECTION OF PROSTATE  02/05/2012   Procedure: TRANSURETHRAL RESECTION OF THE PROSTATE WITH GYRUS INSTRUMENTS;  Surgeon: Ailene Rud, MD;  Location: [facility];  Service: Urology;  Laterality: N/A;  WITH GYRUS SUPRAPUBIC TUBE OWER TO MAIN     Current Outpatient Medications  Medication Sig Dispense Refill   amLODipine (NORVASC)  10 MG tablet Take 10 mg by mouth daily.     aspirin EC 81 MG tablet Take 81 mg by mouth daily.     atorvastatin (LIPITOR) 80 MG tablet Take 1 tablet (80 mg total) by mouth daily. 90 tablet 3   clopidogrel (PLAVIX) 75 MG tablet Take 75 mg by mouth daily.     Magnesium 400 MG TABS Take 400 mg by mouth daily.     memantine (NAMENDA) 10 MG tablet Take 10 mg by mouth 2 (two) times daily.     metoprolol tartrate (LOPRESSOR) 25 MG tablet Take 1 tablet (25 mg total) by mouth 2 (two) times daily. 180 tablet 3   nitroGLYCERIN (NITROSTAT) 0.4 MG SL tablet Place 1 tablet (0.4 mg total) under the tongue every 5 (five) minutes as needed for chest pain. 25 tablet 11   pantoprazole (PROTONIX) 40 MG tablet Take 40 mg by mouth daily before breakfast.     temazepam (RESTORIL) 30 MG capsule Take 30 mg by mouth at bedtime as needed for sleep.     No current facility-administered medications for this visit.    Allergies:   Naproxen    Social History:  The patient  reports that he has never smoked. He has never used smokeless tobacco. He reports current alcohol use. He reports that he does not use drugs.   Family History:  The patient's family history includes Heart disease in his father and mother.    ROS:  Please see the history of present illness.   Otherwise, review of systems are positive for none.   All other systems are reviewed and negative.    PHYSICAL EXAM: VS:  BP 128/62 (BP Location: Left Arm, Patient Position: Sitting, Cuff Size: Normal)   Pulse (!) 41   Ht '5\' 6"'$  (1.676 m)   Wt 188 lb 12.8 oz (85.6 kg)   SpO2 97%   BMI 30.47 kg/m  , BMI Body mass index is 30.47 kg/m. GEN: Well nourished, well developed, in no acute distress HEENT: normal Neck: no JVD, carotid bruits, or masses Cardiac: RRR; no murmurs, rubs, or gallops,no edema, right radial site with mild bruising no hematoma Respiratory:  clear to auscultation bilaterally, normal work of breathing GI: soft, nontender, nondistended, +  BS MS: no deformity or atrophy Skin: warm and dry, no rash Neuro:  Strength and sensation are intact Psych: euthymic mood, full affect   EKG:  EKG is not ordered today.     Recent Labs: 10/28/2021: ALT 29 11/14/2021: BUN 15; Creatinine, Ser 0.97; Hemoglobin 12.3; Platelets 93; Potassium 4.0; Sodium 138    Lipid Panel    Component Value Date/Time   CHOL 135 10/28/2021 1139   TRIG 105 10/28/2021 1139   HDL 54 10/28/2021 1139   CHOLHDL 2.5 10/28/2021 1139   LDLCALC 62 10/28/2021 1139      Wt Readings from Last 3 Encounters:  12/04/21 188 lb 12.8 oz (85.6 kg)  11/13/21 175  lb (79.4 kg)  10/31/21 189 lb 3.2 oz (85.8 kg)      Other studies Reviewed: Additional studies/ records that were reviewed today include:   Echocardiogram 08/06/2019:  1. Left ventricular ejection fraction, by estimation, is 50 to 55%. The  left ventricle has low normal function. The left ventricle has no regional  wall motion abnormalities. There is moderate left ventricular hypertrophy.  Left ventricular diastolic  parameters are consistent with Grade I diastolic dysfunction (impaired  relaxation).   2. Right ventricular systolic function is normal. The right ventricular  size is normal.   3. The mitral valve is abnormal. Trivial mitral valve regurgitation.   4. The aortic valve is tricuspid. Aortic valve regurgitation is not  visualized. Mild aortic valve sclerosis is present, with no evidence of  aortic valve stenosis.    Cardiac monitor 09/20/2019: ZIO XT reviewed.  5 days 23 hours analyzed.  Predominant rhythm is sinus with heart rate ranging from 47 bpm up to 100 bpm and average heart rate 62 bpm.  There were occasional PACs representing 2.5% of total beats and occasional PVCs representing 3% of total beats.  Also relatively brief episodes of SVT, the longest of which lasted for 14 beats.  No sustained arrhythmias or pauses.  CLINICAL DATA:  Chest pain   EXAM: Cardiac/Coronary CTA    TECHNIQUE: A non-contrast, gated CT scan was obtained with axial slices of 3 mm through the heart for calcium scoring. Calcium scoring was performed using the Agatston method. A 120 kV prospective, gated, contrast cardiac scan was obtained. Gantry rotation speed was 250 msecs and collimation was 0.6 mm. Two sublingual nitroglycerin tablets (0.8 mg) were given. The 3D data set was reconstructed in 5% intervals of the 35-75% of the R-R cycle. Diastolic phases were analyzed on a dedicated workstation using MPR, MIP, and VRT modes. The patient received 95 cc of contrast.   FINDINGS: Image quality: Excellent.   Noise artifact is: Limited.   Coronary Arteries:  Normal coronary origin.  Right dominance.   Left main: The left main is a large caliber vessel with a normal take off from the left coronary cusp that trifurcates into a LAD, LCX, and ramus intermedius. There is minimal calcified plaque (<25%).   Left anterior descending artery: The proximal LAD is subtotally occluded (100%) after the first diagonal. The first diagonal contains mild calcified plaque (<25%).   Ramus intermedius: Patent with no evidence of plaque or stenosis.   Left circumflex artery: The LCX is non-dominant. The proximal segment contains mild calcified plaque (25-49%). The LCX gives off 1 patent obtuse marginal branch.   Right coronary artery: The RCA is dominant with normal take off from the right coronary cusp. There is minimal calcified plaque (<25%) in the proximal and mid segments. The distal RCA contains mild calcified plaque (25-49%). The RCA terminates as a PDA and right posterolateral branch. The PDA is patent. The PLV contains minimal calcified plaque (<25%).   Right Atrium: Right atrial size is within normal limits.   Right Ventricle: The right ventricular cavity is within normal limits.   Left Atrium: Left atrial size is normal in size with no left atrial appendage filling defect.   Left  Ventricle: The ventricular cavity size is within normal limits.   Pulmonary arteries: Normal in size without proximal filling defect.   Pulmonary veins: Normal pulmonary venous drainage.   Pericardium: Normal thickness without significant effusion or calcium present.   Cardiac valves: The aortic valve is trileaflet without  significant calcification. The mitral valve is normal without significant calcification.   Aorta: Normal caliber without significant disease.   Extra-cardiac findings: See attached radiology report for non-cardiac structures.   IMPRESSION: 1. Coronary calcium score of 1435. This was 75th percentile for age-, sex, and race-matched controls.   2. Normal coronary origin with right dominance.   3. Subtotal occlusion of the proximal LAD (100%).   4. Mild CAD (25-49%) in the LCX/RCA.   RECOMMENDATIONS: 1. Total coronary occlusion (100%). Cardiac catheterization recommended.   Eleonore Chiquito, MD     Electronically Signed   By: Eleonore Chiquito M.D.   On: 10/25/2021 15:22  CORONARY STENT INTERVENTION  LEFT HEART CATH AND CORONARY ANGIOGRAPHY   Conclusion      Ost LM to Mid LM lesion is 20% stenosed.   Prox LAD to Mid LAD lesion is 100% stenosed.   Ost Cx to Prox Cx lesion is 50% stenosed.   RPAV lesion is 30% stenosed.   Mid RCA to Dist RCA lesion is 20% stenosed.   Post intervention, there is a 100% residual stenosis.   The left ventricular systolic function is normal.   LV end diastolic pressure is mildly elevated.   The left ventricular ejection fraction is 55-65% by visual estimate.   Single vessel occlusive CAD with CTO of the mid LAD immediately after the first diagonal. There are good right to left collaterals from the RCA. The LAD is a large vessel Normal LV function. Mildly elevated LVEDP 22 mm Hg Unsuccessful PCI of the LAD - unable to cross with lesion with a wire.   Plan: will DC home today. Continue medical therapy. Will consider  bringing him back for dedicated CTO PCI.   Coronary Diagrams  Diagnostic Dominance: Right  Intervention   CTO intervention 11/13/21:  CORONARY STENT INTERVENTION  CORONARY CTO INTERVENTION  Intravascular Ultrasound/IVUS   Conclusion      Prox LAD to Mid LAD lesion is 100% stenosed.   A drug-eluting stent was successfully placed using a SYNERGY XD 2.75X28.   A drug-eluting stent was successfully placed using a SYNERGY XD 3.0X38.   Post intervention, there is a 0% residual stenosis.   Successful CTO PCI of the mid to distal LAD using IVUS guidance and overlapping DES x 2.   Plan: observe overnight. Anticipate DC in am. Plan DAPT for one year.   Coronary Diagrams  Diagnostic Dominance: Right  Intervention   Implants     Permanent Stent  Synergy Xd 2.75x28 - [id number] - Implanted  Inventory item: Gonzella Lex 9.47S96 Model/Cat number: [id number]  Manufacturer: Wilkie Aye Lot number: [id number]  Device identifier: [id number] Device identifier type: GS1  GUDID Information  Request status Successful    Brand name: [place] Rote Version/Model: [id number]  Company name: [employer] safety info as of 11/13/21: MR Conditional  Contains dry or latex rubber: No    GMDN P.T. name: Drug-eluting coronary artery stent, non-bioabsorbable-polymer-coated     As of 11/13/2021  Status: Implanted      Synergy Xd 3.0x38 - [id number] - Implanted  Inventory item: Gonzella Lex 3.0X38 Model/Cat number: [id number]  Manufacturer: Wilkie Aye Lot number: [id number]  Device identifier: [id number] Device identifier type: GS1  GUDID Information  Request status Successful    Brand name: Larwance Rote Version/Model: [id number]  Company name: [employer] safety info as of 11/13/21: MR Conditional  Contains dry or latex rubber: No    GMDN P.T. name: Drug-eluting coronary artery stent, non-bioabsorbable-polymer-coated  As of  11/13/2021  Status: Implanted       Syngo Images   Show images for CARDIAC CATHETERIZATION Images on Long Term Storage   Show images for Debose, YUSSEF JORGE to Procedure Log  Procedure Log    Hemo Data  Flowsheet Row Most Recent Value  AO Systolic Pressure 281 mmHg  AO Diastolic Pressure 60 mmHg  AO Mean 90 mmHg  LV Systolic Pressure -9 mmHg  LV Diastolic Pressure -10 mmHg  LV EDP 0 mmHg  AOp Systolic Pressure 188 mmHg  AOp Diastolic Pressure 59 mmHg  AOp Mean Pressure 88 mmHg  LVp Systolic Pressure 677 mmHg  LVp Diastolic Pressure -12 mmHg  LVp EDP Pressure 14 mmHg     ASSESSMENT AND PLAN:  1.  CAD with angina pectoris. Coronary CTA indicates occlusion of the LAD. Otherwise nonobstructive CAD. LV function is normal.  Now s/p successful CTO PCI of the LAD. Continue DAPT for one year.   2. HLD on statin. LDL at goal 62.   3. HTN. BP controlled. Concerned that his HR is too slow so will reduce metoprolol to 25 mg bid.    Current medicines are reviewed at length with the patient today.  The patient does not have concerns regarding medicines.  The following changes have been made:  no change  Labs/ tests ordered today include:   No orders of the defined types were placed in this encounter.        Disposition:   FU 6 months   Signed, Euel Castile [place], MD  12/04/2021 9:51 AM    [facility] [address] [place], [place], [address] Phone [phone], Fax [phone]

## 2021-12-04 ENCOUNTER — Encounter: Payer: Self-pay | Admitting: Cardiology

## 2021-12-04 ENCOUNTER — Ambulatory Visit (INDEPENDENT_AMBULATORY_CARE_PROVIDER_SITE_OTHER): Payer: Medicare Other | Admitting: Cardiology

## 2021-12-04 VITALS — BP 128/62 | HR 41 | Ht 66.0 in | Wt 188.8 lb

## 2021-12-04 DIAGNOSIS — I25118 Atherosclerotic heart disease of native coronary artery with other forms of angina pectoris: Secondary | ICD-10-CM | POA: Diagnosis not present

## 2021-12-04 DIAGNOSIS — E7800 Pure hypercholesterolemia, unspecified: Secondary | ICD-10-CM | POA: Diagnosis not present

## 2021-12-04 DIAGNOSIS — I200 Unstable angina: Secondary | ICD-10-CM

## 2021-12-04 DIAGNOSIS — I10 Essential (primary) hypertension: Secondary | ICD-10-CM

## 2021-12-04 MED ORDER — METOPROLOL TARTRATE 25 MG PO TABS: 25.0000 mg | ORAL_TABLET | Freq: Two times a day (BID) | ORAL | 3 refills | Status: DC

## 2021-12-04 NOTE — Patient Instructions (Signed)
Medication Instructions:  Decrease Metoprolol to 25 mg twice a day Continue all other medications *If you need a refill on your cardiac medications before your next appointment, please call your pharmacy*   Lab Work: None ordered   Testing/Procedures: None ordered   Follow-Up: At [facility], you and your health needs are our priority.  As part of our continuing mission to provide you with exceptional heart care, we have created designated Provider Care Teams.  These Care Teams include your primary Cardiologist (physician) and Advanced Practice Providers (APPs -  Physician Assistants and Nurse Practitioners) who all work together to provide you with the care you need, when you need it.  We recommend signing up for the patient portal called "MyChart".  Sign up information is provided on this After Visit Summary.  MyChart is used to connect with patients for Virtual Visits (Telemedicine).  Patients are able to view lab/test results, encounter notes, upcoming appointments, etc.  Non-urgent messages can be sent to your provider as well.   To learn more about what you can do with MyChart, go to [email or web address].      Your next appointment:  6 months   Call in Oct to schedule Feb appointment     The format for your next appointment: Office   Provider:  [place]

## 2021-12-10 ENCOUNTER — Telehealth: Payer: Self-pay | Admitting: Cardiology

## 2021-12-10 NOTE — Telephone Encounter (Signed)
Pt c/o medication issue:  1. Name of Medication: metoprolol tartrate (LOPRESSOR) 25 MG tablet  2. How are you currently taking this medication (dosage and times per day)? Take 1 tablet (25 mg total) by mouth 2 (two) times daily.  3. Are you having a reaction (difficulty breathing--STAT)?  no 4. What is your medication issue? Pt wife stated that pt hr is still in the 40's. She states that he doesn't have any energy and feels very sluggish. She states they are leaving for [place] tomorrow and need to know what to do.

## 2021-12-10 NOTE — Telephone Encounter (Signed)
Spoke to patient Dr.Jordan advised to stop taking Metoprolol.

## 2021-12-10 NOTE — Telephone Encounter (Signed)
Returned call to patient-patient reports HR continues to be in the 40s and very fatigued and very sluggish.  He is leaving for [place] tomorrow and would like to make changes to hopefully have more energy.   He does not check BP.   Advised would route to MD to review.    OV 8/2-HR 41, metoprolol decreased to 25 BID

## 2022-01-02 DIAGNOSIS — M25551 Pain in right hip: Secondary | ICD-10-CM | POA: Diagnosis not present

## 2022-01-07 ENCOUNTER — Ambulatory Visit (INDEPENDENT_AMBULATORY_CARE_PROVIDER_SITE_OTHER): Payer: Medicare Other | Admitting: Family Medicine

## 2022-01-07 ENCOUNTER — Telehealth: Payer: Self-pay | Admitting: Family Medicine

## 2022-01-07 ENCOUNTER — Encounter: Payer: Self-pay | Admitting: Family Medicine

## 2022-01-07 VITALS — BP 128/66 | HR 57 | Temp 97.8°F | Ht 66.0 in | Wt 186.8 lb

## 2022-01-07 DIAGNOSIS — Z0000 Encounter for general adult medical examination without abnormal findings: Secondary | ICD-10-CM

## 2022-01-07 DIAGNOSIS — R413 Other amnesia: Secondary | ICD-10-CM

## 2022-01-07 NOTE — Progress Notes (Signed)
Established Patient Office Visit  Subjective   Patient ID: Philip [place], male    DOB: 1941/04/13  Age: 81 y.o. MRN: [id number]  Chief Complaint  Patient presents with   Establish Care    NP/establish care no concerns. Patient not fasting. Pt not sure if he should start back on Metoprolol     HPI here for establishment of care with his new wife of 6 weeks.  They have returned from their honeymoon in [place].  They did a lot of walking over there.  Cardiology had discontinued or held his metoprolol and wonders if he should start it back.  Blood pressures been well controlled on current regimen.  He has felt no palpitations or experienced shortness of breath or chest pain.  He did have a history of lightheadedness.  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  Continues to drive but does not get lost.  Continues all ADLs.  It is noted that he sometimes needs to repeat himself and repeats questions recently asked.  Not exercising regularly.  He does not smoke.  No regular dental care he is edentulous.    Review of Systems  Constitutional: Negative.   HENT: Negative.    Eyes:  Negative for blurred vision, discharge and redness.  Respiratory: Negative.    Cardiovascular: Negative.   Gastrointestinal:  Negative for abdominal pain.  Genitourinary: Negative.   Musculoskeletal: Negative.  Negative for myalgias.  Skin:  Negative for rash.  Neurological:  Negative for tingling, loss of consciousness and weakness.  Endo/Heme/Allergies:  Negative for polydipsia.  Psychiatric/Behavioral:  Positive for memory loss. Negative for depression. The patient is not nervous/anxious.       Objective:     BP 128/66 (BP Location: Right Arm, Patient Position: Sitting, Cuff Size: Large)   Pulse (!) 57   Temp 97.8 F (36.6 C) (Temporal)   Ht '5\' 6"'$  (1.676 m)   Wt 186 lb 12.8 oz (84.7 kg)   SpO2 96%   BMI 30.15 kg/m    Physical Exam Constitutional:      General: He is  not in acute distress.    Appearance: Normal appearance. He is not ill-appearing, toxic-appearing or diaphoretic.  HENT:     Head: Normocephalic and atraumatic.     Right Ear: External ear normal.     Left Ear: External ear normal.     Mouth/Throat:     Mouth: Mucous membranes are moist.     Pharynx: Oropharynx is clear. No oropharyngeal exudate or posterior oropharyngeal erythema.  Eyes:     General: No scleral icterus.       Right eye: No discharge.        Left eye: No discharge.     Extraocular Movements: Extraocular movements intact.     Conjunctiva/sclera: Conjunctivae normal.     Pupils: Pupils are equal, round, and reactive to light.  Cardiovascular:     Rate and Rhythm: Normal rate and regular rhythm.  Pulmonary:     Effort: Pulmonary effort is normal. No respiratory distress.     Breath sounds: Normal breath sounds.  Abdominal:     General: Bowel sounds are normal.     Tenderness: There is no abdominal tenderness. There is no guarding.  Musculoskeletal:     Cervical back: No rigidity or tenderness.  Skin:    General: Skin is warm and dry.  Neurological:     Mental Status: He is alert and oriented to person,  place, and time.  Psychiatric:        Mood and Affect: Mood normal.        Behavior: Behavior normal.      No results found for any visits on 01/07/22.    The ASCVD Risk score (Arnett DK, et al., 2019) failed to calculate for the following reasons:   The 2019 ASCVD risk score is only valid for ages 27 to 42    Assessment & Plan:   Problem List Items Addressed This Visit   None Visit Diagnoses     Memory deficit    -  Primary   Healthcare maintenance           Return in about 6 months (around 07/08/2022).  Encourage regular exercise.  Patient is not interested in a second memory drug at this time. Information given on neurocognitive disorder.  Encouraged him to make lists eat a healthy diet and work on puzzles.  I see no reason to restart  metoprolol.  He will also follow-up with cardiology.  Libby Maw, MD

## 2022-01-07 NOTE — Telephone Encounter (Signed)
Pt would like to do TOC to dr. Grandville Silos from dr Ethelene Hal

## 2022-02-20 DIAGNOSIS — Z23 Encounter for immunization: Secondary | ICD-10-CM | POA: Diagnosis not present

## 2022-02-26 NOTE — Progress Notes (Signed)
Cardiology Office Note   Date:  03/12/2022   ID:  Philip Allen, DOB [birth year], MRN [id number]  PCP:  Libby Maw, MD  Cardiologist:  Rozann Lesches MD   Chief Complaint  Patient presents with   Coronary Artery Disease      History of Present Illness: Philip Allen is a 81 y.o. male who is seen for follow up CAD.  He has been evaluated by Dr Domenic Polite in the past for syncope. Evaluation in 2021 showed unremarkable Echo. Event monitor showed PACs and PVCs. Managed medically. He has a history of CVA, HTN, HLD and ED. He was seen in the emergency room on 10/05/2021 for complaints of chest heaviness located in the center of his chest with shortness of breath.  He felt weakness and was unable to stand so he laid down on the ground but never lost consciousness.  He reported that it lasted approximately 10 minutes.  His wife provided him 4 baby aspirin and symptoms resolved after 10 minutes however he was seen in the ED for further evaluation.  EKG did not show evidence of ACS, it revealed sinus rhythm with borderline intraventricular conduction delay similar to prior EKG in 2021.  He declined admission, and he was found to have 2 negative cardiac enzymes during observation in ED. He was seen by [name] NP in the office on 10/08/21 and scheduled for coronary CTA. This showed a calcium score of 1435 and subtotal occlusion of the LAD.   He did undergo cardiac cath demonstrating occlusion of the LAD after the first diagonal. It fills by right to left collaterals from the RCA. We attempted to cross but were unable to cross with a prowater or [employer] 3 wire. He later underwent CTO PCI on 11/13/21 and we able to cross with a Confienza pro 12 wire and the LAD was successfully stented. We did stop metoprolol due to bradycardia.   He is  remarried and had a wonderful honeymoon in [place]. He is experiencing bilateral hip pain with exertion that goes away with rest. Was  previously on Celebrex but now taking Doan's pills. No chest pain or dyspnea. Planning to have eyelid surgery once able to hold Plavix.    Past Medical History:  Diagnosis Date   BPH (benign prostatic hypertrophy)    GERD (gastroesophageal reflux disease)    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   Hyperlipidemia    Hypertension    Urgency of urination     Past Surgical History:  Procedure Laterality Date   CORONARY CTO INTERVENTION N/A 11/13/2021   Procedure: CORONARY CTO INTERVENTION;  Surgeon: Martinique, Davaris Youtsey M, MD;  Location: [facility];  Service: Cardiovascular;  Laterality: N/A;   CORONARY STENT INTERVENTION N/A 10/29/2021   Procedure: CORONARY STENT INTERVENTION;  Surgeon: Martinique, Maryanna Stuber M, MD;  Location: [facility];  Service: Cardiovascular;  Laterality: N/A;   CORONARY STENT INTERVENTION N/A 11/13/2021   Procedure: CORONARY STENT INTERVENTION;  Surgeon: Martinique, Lavoy Bernards M, MD;  Location: [facility];  Service: Cardiovascular;  Laterality: N/A;   INTRAVASCULAR ULTRASOUND/IVUS N/A 11/13/2021   Procedure: Intravascular Ultrasound/IVUS;  Surgeon: Martinique, Findley Vi M, MD;  Location: [facility];  Service: Cardiovascular;  Laterality: N/A;   LEFT HEART CATH AND CORONARY ANGIOGRAPHY N/A 10/29/2021   Procedure: LEFT HEART CATH AND CORONARY ANGIOGRAPHY;  Surgeon: Martinique, Viann Nielson M, MD;  Location: [facility];  Service: Cardiovascular;  Laterality:  N/A;   RIGHT SHOULDER SURGERY  1998   TRANSURETHRAL RESECTION OF PROSTATE  02/05/2012   Procedure: TRANSURETHRAL RESECTION OF THE PROSTATE WITH GYRUS INSTRUMENTS;  Surgeon: Ailene Rud, MD;  Location: [facility];  Service: Urology;  Laterality: N/A;  WITH GYRUS SUPRAPUBIC TUBE OWER TO MAIN     Current Outpatient Medications  Medication Sig Dispense Refill   amLODipine (NORVASC) 10 MG tablet Take 10 mg by mouth daily.     aspirin EC 81 MG tablet Take 81 mg by mouth daily.      atorvastatin (LIPITOR) 80 MG tablet Take 1 tablet (80 mg total) by mouth daily. 90 tablet 3   clopidogrel (PLAVIX) 75 MG tablet Take 75 mg by mouth daily.     Magnesium 400 MG TABS Take 400 mg by mouth daily.     memantine (NAMENDA) 10 MG tablet Take 10 mg by mouth 2 (two) times daily.     pantoprazole (PROTONIX) 40 MG tablet Take 40 mg by mouth daily before breakfast.     temazepam (RESTORIL) 30 MG capsule Take 30 mg by mouth at bedtime as needed for sleep.     celecoxib (CELEBREX) 100 MG capsule Take 1 capsule (100 mg total) by mouth 2 (two) times daily. 60 capsule 6   nitroGLYCERIN (NITROSTAT) 0.4 MG SL tablet Place 1 tablet (0.4 mg total) under the tongue every 5 (five) minutes as needed for chest pain. 25 tablet 11   No current facility-administered medications for this visit.    Allergies:   Naproxen    Social History:  The patient  reports that he has never smoked. He has never used smokeless tobacco. He reports current alcohol use. He reports that he does not use drugs.   Family History:  The patient's family history includes Heart disease in his father and mother.    ROS:  Please see the history of present illness.   Otherwise, review of systems are positive for none.   All other systems are reviewed and negative.    PHYSICAL EXAM: VS:  BP (!) 140/56   Pulse (!) 46   Ht '5\' 6"'$  (1.676 m)   Wt 195 lb (88.5 kg)   SpO2 95%   BMI 31.47 kg/m  , BMI Body mass index is 31.47 kg/m. GEN: Well nourished, well developed, in no acute distress HEENT: normal Neck: no JVD, carotid bruits, or masses Cardiac: RRR; no murmurs, rubs, or gallops,no edema, right radial site with mild bruising no hematoma Respiratory:  clear to auscultation bilaterally, normal work of breathing GI: soft, nontender, nondistended, + BS MS: no deformity or atrophy Skin: warm and dry, no rash Neuro:  Strength and sensation are intact Psych: euthymic mood, full affect   EKG:  EKG is not ordered  today.     Recent Labs: 10/28/2021: ALT 29 11/14/2021: BUN 15; Creatinine, Ser 0.97; Hemoglobin 12.3; Platelets 93; Potassium 4.0; Sodium 138    Lipid Panel    Component Value Date/Time   CHOL 135 10/28/2021 1139   TRIG 105 10/28/2021 1139   HDL 54 10/28/2021 1139   CHOLHDL 2.5 10/28/2021 1139   LDLCALC 62 10/28/2021 1139      Wt Readings from Last 3 Encounters:  03/12/22 195 lb (88.5 kg)  01/07/22 186 lb 12.8 oz (84.7 kg)  12/04/21 188 lb 12.8 oz (85.6 kg)      Other studies Reviewed: Additional studies/ records that were reviewed today include:   Echocardiogram 08/06/2019:  1. Left ventricular ejection  fraction, by estimation, is 50 to 55%. The  left ventricle has low normal function. The left ventricle has no regional  wall motion abnormalities. There is moderate left ventricular hypertrophy.  Left ventricular diastolic  parameters are consistent with Grade I diastolic dysfunction (impaired  relaxation).   2. Right ventricular systolic function is normal. The right ventricular  size is normal.   3. The mitral valve is abnormal. Trivial mitral valve regurgitation.   4. The aortic valve is tricuspid. Aortic valve regurgitation is not  visualized. Mild aortic valve sclerosis is present, with no evidence of  aortic valve stenosis.    Cardiac monitor 09/20/2019: ZIO XT reviewed.  5 days 23 hours analyzed.  Predominant rhythm is sinus with heart rate ranging from 47 bpm up to 100 bpm and average heart rate 62 bpm.  There were occasional PACs representing 2.5% of total beats and occasional PVCs representing 3% of total beats.  Also relatively brief episodes of SVT, the longest of which lasted for 14 beats.  No sustained arrhythmias or pauses.  CLINICAL DATA:  Chest pain   EXAM: Cardiac/Coronary CTA   TECHNIQUE: A non-contrast, gated CT scan was obtained with axial slices of 3 mm through the heart for calcium scoring. Calcium scoring was performed using the Agatston  method. A 120 kV prospective, gated, contrast cardiac scan was obtained. Gantry rotation speed was 250 msecs and collimation was 0.6 mm. Two sublingual nitroglycerin tablets (0.8 mg) were given. The 3D data set was reconstructed in 5% intervals of the 35-75% of the R-R cycle. Diastolic phases were analyzed on a dedicated workstation using MPR, MIP, and VRT modes. The patient received 95 cc of contrast.   FINDINGS: Image quality: Excellent.   Noise artifact is: Limited.   Coronary Arteries:  Normal coronary origin.  Right dominance.   Left main: The left main is a large caliber vessel with a normal take off from the left coronary cusp that trifurcates into a LAD, LCX, and ramus intermedius. There is minimal calcified plaque (<25%).   Left anterior descending artery: The proximal LAD is subtotally occluded (100%) after the first diagonal. The first diagonal contains mild calcified plaque (<25%).   Ramus intermedius: Patent with no evidence of plaque or stenosis.   Left circumflex artery: The LCX is non-dominant. The proximal segment contains mild calcified plaque (25-49%). The LCX gives off 1 patent obtuse marginal branch.   Right coronary artery: The RCA is dominant with normal take off from the right coronary cusp. There is minimal calcified plaque (<25%) in the proximal and mid segments. The distal RCA contains mild calcified plaque (25-49%). The RCA terminates as a PDA and right posterolateral branch. The PDA is patent. The PLV contains minimal calcified plaque (<25%).   Right Atrium: Right atrial size is within normal limits.   Right Ventricle: The right ventricular cavity is within normal limits.   Left Atrium: Left atrial size is normal in size with no left atrial appendage filling defect.   Left Ventricle: The ventricular cavity size is within normal limits.   Pulmonary arteries: Normal in size without proximal filling defect.   Pulmonary veins: Normal pulmonary  venous drainage.   Pericardium: Normal thickness without significant effusion or calcium present.   Cardiac valves: The aortic valve is trileaflet without significant calcification. The mitral valve is normal without significant calcification.   Aorta: Normal caliber without significant disease.   Extra-cardiac findings: See attached radiology report for non-cardiac structures.   IMPRESSION: 1. Coronary calcium score  of 1435. This was 75th percentile for age-, sex, and race-matched controls.   2. Normal coronary origin with right dominance.   3. Subtotal occlusion of the proximal LAD (100%).   4. Mild CAD (25-49%) in the LCX/RCA.   RECOMMENDATIONS: 1. Total coronary occlusion (100%). Cardiac catheterization recommended.   Eleonore Chiquito, MD     Electronically Signed   By: Eleonore Chiquito M.D.   On: 10/25/2021 15:22  CORONARY STENT INTERVENTION  LEFT HEART CATH AND CORONARY ANGIOGRAPHY   Conclusion      Ost LM to Mid LM lesion is 20% stenosed.   Prox LAD to Mid LAD lesion is 100% stenosed.   Ost Cx to Prox Cx lesion is 50% stenosed.   RPAV lesion is 30% stenosed.   Mid RCA to Dist RCA lesion is 20% stenosed.   Post intervention, there is a 100% residual stenosis.   The left ventricular systolic function is normal.   LV end diastolic pressure is mildly elevated.   The left ventricular ejection fraction is 55-65% by visual estimate.   Single vessel occlusive CAD with CTO of the mid LAD immediately after the first diagonal. There are good right to left collaterals from the RCA. The LAD is a large vessel Normal LV function. Mildly elevated LVEDP 22 mm Hg Unsuccessful PCI of the LAD - unable to cross with lesion with a wire.   Plan: will DC home today. Continue medical therapy. Will consider bringing him back for dedicated CTO PCI.   Coronary Diagrams  Diagnostic Dominance: Right  Intervention   CTO intervention 11/13/21:  CORONARY STENT INTERVENTION   CORONARY CTO INTERVENTION  Intravascular Ultrasound/IVUS   Conclusion      Prox LAD to Mid LAD lesion is 100% stenosed.   A drug-eluting stent was successfully placed using a SYNERGY XD 2.75X28.   A drug-eluting stent was successfully placed using a SYNERGY XD 3.0X38.   Post intervention, there is a 0% residual stenosis.   Successful CTO PCI of the mid to distal LAD using IVUS guidance and overlapping DES x 2.   Plan: observe overnight. Anticipate DC in am. Plan DAPT for one year.   Coronary Diagrams  Diagnostic Dominance: Right  Intervention   Implants     Permanent Stent  Synergy Xd 2.75x28 - [id number] - Implanted  Inventory item: Gonzella Lex 4.09W11 Model/Cat number: [id number]  Manufacturer: Wilkie Aye Lot number: [id number]  Device identifier: [id number] Device identifier type: GS1  GUDID Information  Request status Successful    Brand name: Larwance Rote Version/Model: [id number]  Company name: [employer] safety info as of 11/13/21: MR Conditional  Contains dry or latex rubber: No    GMDN P.T. name: Drug-eluting coronary artery stent, non-bioabsorbable-polymer-coated     As of 11/13/2021  Status: Implanted      Synergy Xd 3.0x38 - [id number] - Implanted  Inventory item: Gonzella Lex 3.0X38 Model/Cat number: [id number]  Manufacturer: Wilkie Aye Lot number: [id number]  Device identifier: [id number] Device identifier type: GS1  GUDID Information  Request status Successful    Brand name: Larwance Rote Version/Model: [id number]  Company name: [employer] safety info as of 11/13/21: MR Conditional  Contains dry or latex rubber: No    GMDN P.T. name: Drug-eluting coronary artery stent, non-bioabsorbable-polymer-coated     As of 11/13/2021  Status: Implanted       Syngo Images   Show images for CARDIAC CATHETERIZATION Images on Long Term Storage   Show images for Bunney, Herbie [place]  L Link to  Procedure Log  Procedure Log    Hemo Data  Flowsheet Row Most Recent Value  AO Systolic Pressure 825 mmHg  AO Diastolic Pressure 60 mmHg  AO Mean 90 mmHg  LV Systolic Pressure -9 mmHg  LV Diastolic Pressure -10 mmHg  LV EDP 0 mmHg  AOp Systolic Pressure 003 mmHg  AOp Diastolic Pressure 59 mmHg  AOp Mean Pressure 88 mmHg  LVp Systolic Pressure 704 mmHg  LVp Diastolic Pressure -12 mmHg  LVp EDP Pressure 14 mmHg     ASSESSMENT AND PLAN:  1.  CAD with angina pectoris. Coronary CTA showed occlusion of the LAD. Otherwise nonobstructive CAD. LV function is normal.  Now s/p successful CTO PCI of the LAD in July. Continue DAPT for one year. I think we could hold plavis after 6 months for blepharoplasty.   2. HLD on statin. LDL at goal 62.   3. HTN. BP controlled. Continue amlodipine. Metoprolol stopped due to bradycardia.   4. Bilateral hip pain with exertion. Will check LE dopplers. May be arthritic and I would favor using Celebrex as needed rather than Doan's pills.    Current medicines are reviewed at length with the patient today.  The patient does not have concerns regarding medicines.  The following changes have been made:  no change  Labs/ tests ordered today include:   Orders Placed This Encounter  Procedures   VAS [place] LOWER EXTREMITY ARTERIAL DUPLEX         Disposition:   FU 6 months   Signed, Aeisha Minarik Martinique, MD  03/12/2022 4:50 PM    [facility] [address], [place], [place], [address] Phone [phone], Fax [phone]

## 2022-03-04 DIAGNOSIS — Z23 Encounter for immunization: Secondary | ICD-10-CM | POA: Diagnosis not present

## 2022-03-12 ENCOUNTER — Ambulatory Visit: Payer: Medicare Other | Attending: Cardiology | Admitting: Cardiology

## 2022-03-12 ENCOUNTER — Encounter: Payer: Self-pay | Admitting: Cardiology

## 2022-03-12 VITALS — BP 140/56 | HR 46 | Ht 66.0 in | Wt 195.0 lb

## 2022-03-12 DIAGNOSIS — E7800 Pure hypercholesterolemia, unspecified: Secondary | ICD-10-CM | POA: Diagnosis not present

## 2022-03-12 DIAGNOSIS — M25552 Pain in left hip: Secondary | ICD-10-CM | POA: Diagnosis not present

## 2022-03-12 DIAGNOSIS — I200 Unstable angina: Secondary | ICD-10-CM

## 2022-03-12 DIAGNOSIS — I10 Essential (primary) hypertension: Secondary | ICD-10-CM | POA: Diagnosis not present

## 2022-03-12 DIAGNOSIS — M25551 Pain in right hip: Secondary | ICD-10-CM | POA: Insufficient documentation

## 2022-03-12 DIAGNOSIS — I25118 Atherosclerotic heart disease of native coronary artery with other forms of angina pectoris: Secondary | ICD-10-CM | POA: Insufficient documentation

## 2022-03-12 MED ORDER — CELECOXIB 100 MG PO CAPS: 100.0000 mg | ORAL_CAPSULE | Freq: Two times a day (BID) | ORAL | 6 refills | Status: DC

## 2022-03-12 NOTE — Patient Instructions (Signed)
Medication Instructions:  Stop taking Doan May take Celebrex as needed for pain Continue all other medications *If you need a refill on your cardiac medications before your next appointment, please call your pharmacy*   Lab Work: None ordered   Testing/Procedures: Schedule Lower Ext Arterial Dopplers   Follow-Up: At [facility], you and your health needs are our priority.  As part of our continuing mission to provide you with exceptional heart care, we have created designated Provider Care Teams.  These Care Teams include your primary Cardiologist (physician) and Advanced Practice Providers (APPs -  Physician Assistants and Nurse Practitioners) who all work together to provide you with the care you need, when you need it.  We recommend signing up for the patient portal called "MyChart".  Sign up information is provided on this After Visit Summary.  MyChart is used to connect with patients for Virtual Visits (Telemedicine).  Patients are able to view lab/test results, encounter notes, upcoming appointments, etc.  Non-urgent messages can be sent to your provider as well.   To learn more about what you can do with MyChart, go to [email or web address].    Your next appointment:  6 months   Call in Feb to schedule May appointment     The format for your next appointment: Office   Provider:  [place]

## 2022-03-18 ENCOUNTER — Other Ambulatory Visit (HOSPITAL_COMMUNITY): Payer: Self-pay | Admitting: Cardiology

## 2022-03-18 DIAGNOSIS — I739 Peripheral vascular disease, unspecified: Secondary | ICD-10-CM

## 2022-03-19 ENCOUNTER — Telehealth: Payer: Self-pay | Admitting: Cardiology

## 2022-03-19 NOTE — Telephone Encounter (Signed)
Yes, appears to be safe to take

## 2022-03-19 NOTE — Telephone Encounter (Signed)
Routed to pharmD to review

## 2022-03-19 NOTE — Telephone Encounter (Signed)
Pt c/o medication issue:  1. Name of Medication: Hyland's leg cramp  pills  2. How are you currently taking this medication (dosage and times per day)?   3. Are you having a reaction (difficulty breathing--STAT)?   4. What is your medication issue? Patient wants to know if he can take these with his condition

## 2022-03-19 NOTE — Telephone Encounter (Signed)
Left message to call back  

## 2022-03-24 ENCOUNTER — Ambulatory Visit (HOSPITAL_COMMUNITY)
Admission: RE | Admit: 2022-03-24 | Discharge: 2022-03-24 | Disposition: A | Discharge status: Home / Self Care | Payer: Medicare Other | Source: Ambulatory Visit | Attending: Internal Medicine | Admitting: Internal Medicine

## 2022-03-24 DIAGNOSIS — E7800 Pure hypercholesterolemia, unspecified: Secondary | ICD-10-CM | POA: Diagnosis not present

## 2022-03-24 DIAGNOSIS — I25118 Atherosclerotic heart disease of native coronary artery with other forms of angina pectoris: Secondary | ICD-10-CM | POA: Insufficient documentation

## 2022-03-24 DIAGNOSIS — M25551 Pain in right hip: Secondary | ICD-10-CM | POA: Diagnosis not present

## 2022-03-24 DIAGNOSIS — I10 Essential (primary) hypertension: Secondary | ICD-10-CM | POA: Diagnosis not present

## 2022-03-24 DIAGNOSIS — M25552 Pain in left hip: Secondary | ICD-10-CM | POA: Diagnosis not present

## 2022-03-24 DIAGNOSIS — I739 Peripheral vascular disease, unspecified: Secondary | ICD-10-CM | POA: Diagnosis not present

## 2022-03-25 ENCOUNTER — Encounter: Payer: Self-pay | Admitting: Cardiology

## 2022-03-25 NOTE — Telephone Encounter (Signed)
error 

## 2022-03-25 NOTE — Telephone Encounter (Signed)
Patient advised that PharmD said it is safe to take Hyland's leg cramp pills. He said they work great.

## 2022-05-09 ENCOUNTER — Ambulatory Visit (INDEPENDENT_AMBULATORY_CARE_PROVIDER_SITE_OTHER): Payer: Medicare Other

## 2022-05-09 VITALS — BP 138/60 | HR 55 | Temp 97.7°F | Ht 67.0 in | Wt 193.8 lb

## 2022-05-09 DIAGNOSIS — Z0000 Encounter for general adult medical examination without abnormal findings: Secondary | ICD-10-CM | POA: Diagnosis not present

## 2022-05-09 NOTE — Patient Instructions (Signed)
Philip Allen , Thank you for taking time to come for your Medicare Wellness Visit. I appreciate your ongoing commitment to your health goals. Please review the following plan we discussed and let me know if I can assist you in the future.   These are the goals we discussed:  Goals      Patient Stated     05/09/2022, no goals        This is a list of the screening recommended for you and due dates:  Health Maintenance  Topic Date Due   DTaP/Tdap/Td vaccine (1 - Tdap) Never done   Pneumonia Vaccine (2 - PPSV23 or PCV20) 03/24/2015   COVID-19 Vaccine (7 - 2023-24 season) 04/29/2022   Medicare Annual Wellness Visit  05/10/2023   Flu Shot  Completed   Zoster (Shingles) Vaccine  Completed   HPV Vaccine  Aged Out    Advanced directives: Please bring a copy of your POA (Power of [place]) and/or Living Will to your next appointment.   Conditions/risks identified: none  Next appointment: Follow up in one year for your annual wellness visit.   Preventive Care 43 Years and Older, Male  Preventive care refers to lifestyle choices and visits with your health care provider that can promote health and wellness. What does preventive care include? A yearly physical exam. This is also called an annual well check. Dental exams once or twice a year. Routine eye exams. Ask your health care provider how often you should have your eyes checked. Personal lifestyle choices, including: Daily care of your teeth and gums. Regular physical activity. Eating a healthy diet. Avoiding tobacco and drug use. Limiting alcohol use. Practicing safe sex. Taking low doses of aspirin every day. Taking vitamin and mineral supplements as recommended by your health care provider. What happens during an annual well check? The services and screenings done by your health care provider during your annual well check will depend on your age, overall health, lifestyle risk factors, and family history of  disease. Counseling  Your health care provider may ask you questions about your: Alcohol use. Tobacco use. Drug use. Emotional well-being. Home and relationship well-being. Sexual activity. Eating habits. History of falls. Memory and ability to understand (cognition). Work and work Statistician. Screening  You may have the following tests or measurements: Height, weight, and BMI. Blood pressure. Lipid and cholesterol levels. These may be checked every 5 years, or more frequently if you are over 37 years old. Skin check. Lung cancer screening. You may have this screening every year starting at age 90 if you have a 30-pack-year history of smoking and currently smoke or have quit within the past 15 years. Fecal occult blood test (FOBT) of the stool. You may have this test every year starting at age 41. Flexible sigmoidoscopy or colonoscopy. You may have a sigmoidoscopy every 5 years or a colonoscopy every 10 years starting at age 53. Prostate cancer screening. Recommendations will vary depending on your family history and other risks. Hepatitis C blood test. Hepatitis B blood test. Sexually transmitted disease (STD) testing. Diabetes screening. This is done by checking your blood sugar (glucose) after you have not eaten for a while (fasting). You may have this done every 1-3 years. Abdominal aortic aneurysm (AAA) screening. You may need this if you are a current or former smoker. Osteoporosis. You may be screened starting at age 7 if you are at high risk. Talk with your health care provider about your test results, treatment options, and if  necessary, the need for more tests. Vaccines  Your health care provider may recommend certain vaccines, such as: Influenza vaccine. This is recommended every year. Tetanus, diphtheria, and acellular pertussis (Tdap, Td) vaccine. You may need a Td booster every 10 years. Zoster vaccine. You may need this after age 33. Pneumococcal 13-valent  conjugate (PCV13) vaccine. One dose is recommended after age 35. Pneumococcal polysaccharide (PPSV23) vaccine. One dose is recommended after age 25. Talk to your health care provider about which screenings and vaccines you need and how often you need them. This information is not intended to replace advice given to you by your health care provider. Make sure you discuss any questions you have with your health care provider. Document Released: 05/18/2015 Document Revised: 01/09/2016 Document Reviewed: 02/20/2015 Elsevier Interactive Patient Education  2017 [place] Prevention in the Home Falls can cause injuries. They can happen to people of all ages. There are many things you can do to make your home safe and to help prevent falls. What can I do on the outside of my home? Regularly fix the edges of walkways and driveways and fix any cracks. Remove anything that might make you trip as you walk through a door, such as a raised step or threshold. Trim any bushes or trees on the path to your home. Use bright outdoor lighting. Clear any walking paths of anything that might make someone trip, such as rocks or tools. Regularly check to see if handrails are loose or broken. Make sure that both sides of any steps have handrails. Any raised decks and porches should have guardrails on the edges. Have any leaves, snow, or ice cleared regularly. Use sand or salt on walking paths during winter. Clean up any spills in your garage right away. This includes oil or grease spills. What can I do in the bathroom? Use night lights. Install grab bars by the toilet and in the tub and shower. Do not use towel bars as grab bars. Use non-skid mats or decals in the tub or shower. If you need to sit down in the shower, use a plastic, non-slip stool. Keep the floor dry. Clean up any water that spills on the floor as soon as it happens. Remove soap buildup in the tub or shower regularly. Attach bath mats  securely with double-sided non-slip rug tape. Do not have throw rugs and other things on the floor that can make you trip. What can I do in the bedroom? Use night lights. Make sure that you have a light by your bed that is easy to reach. Do not use any sheets or blankets that are too big for your bed. They should not hang down onto the floor. Have a firm chair that has side arms. You can use this for support while you get dressed. Do not have throw rugs and other things on the floor that can make you trip. What can I do in the kitchen? Clean up any spills right away. Avoid walking on wet floors. Keep items that you use a lot in easy-to-reach places. If you need to reach something above you, use a strong step stool that has a grab bar. Keep electrical cords out of the way. Do not use floor polish or wax that makes floors slippery. If you must use wax, use non-skid floor wax. Do not have throw rugs and other things on the floor that can make you trip. What can I do with my stairs? Do not leave any items  on the stairs. Make sure that there are handrails on both sides of the stairs and use them. Fix handrails that are broken or loose. Make sure that handrails are as long as the stairways. Check any carpeting to make sure that it is firmly attached to the stairs. Fix any carpet that is loose or worn. Avoid having throw rugs at the top or bottom of the stairs. If you do have throw rugs, attach them to the floor with carpet tape. Make sure that you have a light switch at the top of the stairs and the bottom of the stairs. If you do not have them, ask someone to add them for you. What else can I do to help prevent falls? Wear shoes that: Do not have high heels. Have rubber bottoms. Are comfortable and fit you well. Are closed at the toe. Do not wear sandals. If you use a stepladder: Make sure that it is fully opened. Do not climb a closed stepladder. Make sure that both sides of the stepladder  are locked into place. Ask someone to hold it for you, if possible. Clearly mark and make sure that you can see: Any grab bars or handrails. First and last steps. Where the edge of each step is. Use tools that help you move around (mobility aids) if they are needed. These include: Canes. Walkers. Scooters. Crutches. Turn on the lights when you go into a dark area. Replace any light bulbs as soon as they burn out. Set up your furniture so you have a clear path. Avoid moving your furniture around. If any of your floors are uneven, fix them. If there are any pets around you, be aware of where they are. Review your medicines with your doctor. Some medicines can make you feel dizzy. This can increase your chance of falling. Ask your doctor what other things that you can do to help prevent falls. This information is not intended to replace advice given to you by your health care provider. Make sure you discuss any questions you have with your health care provider. Document Released: 02/15/2009 Document Revised: 09/27/2015 Document Reviewed: 05/26/2014 Elsevier Interactive Patient Education  2017 [employer].

## 2022-05-09 NOTE — Progress Notes (Signed)
Subjective:   Philip Allen is a 82 y.o. male who presents for an Initial Medicare Annual Wellness Visit.  Review of Systems     Cardiac Risk Factors include: advanced age (>70mn, >>16women);hypertension;dyslipidemia;male gender     Objective:    Today's Vitals   05/09/22 1510  BP: 138/60  Pulse: (!) 55  Temp: 97.7 F (36.5 C)  TempSrc: Oral  SpO2: 97%  Weight: 193 lb 12.8 oz (87.9 kg)  Height: '5\' 7"'$  (1.702 m)   Body mass index is 30.35 kg/m.     05/09/2022    3:23 PM 11/13/2021    7:02 AM 10/29/2021    7:47 AM 08/06/2019   10:00 AM 03/14/2015    6:23 PM 02/05/2012    3:15 PM 02/05/2012    7:19 AM  Advanced Directives  Does Patient Have a Medical Advance Directive? Yes Yes Yes Yes No Patient does not have advance directive;Patient would like information Patient does not have advance directive;Patient would like information  Type of Advance Directive [place] will [place] will [place]    Does patient want to make changes to medical advance directive?  No - Patient declined  No - Patient declined     Copy of [place] Chart? No - copy requested        Would patient like information on creating a medical advance directive?     No - patient declined information Advance directive packet given Advance directive packet given  Pre-existing out of facility DNR order (yellow form or pink MOST form)      No No    Current Medications (verified) Outpatient Encounter Medications as of 05/09/2022  Medication Sig   amLODipine (NORVASC) 10 MG tablet Take 10 mg by mouth daily.   aspirin EC 81 MG tablet Take 81 mg by mouth daily.   atorvastatin (LIPITOR) 80 MG tablet Take 1 tablet (80 mg total) by mouth daily.   clopidogrel (PLAVIX) 75 MG tablet Take 75 mg by mouth daily.   Magnesium 400 MG TABS Take 400 mg by mouth daily.   pantoprazole (PROTONIX) 40  MG tablet Take 40 mg by mouth daily before breakfast.   temazepam (RESTORIL) 30 MG capsule Take 30 mg by mouth at bedtime as needed for sleep.   celecoxib (CELEBREX) 100 MG capsule Take 1 capsule (100 mg total) by mouth 2 (two) times daily. (Patient not taking: Reported on 05/09/2022)   memantine (NAMENDA) 10 MG tablet Take 10 mg by mouth 2 (two) times daily. (Patient not taking: Reported on 05/09/2022)   nitroGLYCERIN (NITROSTAT) 0.4 MG SL tablet Place 1 tablet (0.4 mg total) under the tongue every 5 (five) minutes as needed for chest pain.   No facility-administered encounter medications on file as of 05/09/2022.    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   Hyperlipidemia    Hypertension    Urgency of urination    Past Surgical History:  Procedure Laterality Date   CORONARY CTO INTERVENTION N/A 11/13/2021   Procedure: CORONARY CTO INTERVENTION;  Surgeon: JMartinique Peter M, MD;  [facility];  Service: Cardiovascular;  Laterality: N/A;   CORONARY STENT INTERVENTION N/A 10/29/2021   Procedure: CORONARY STENT INTERVENTION;  Surgeon: Martinique, Peter M, MD;  Location: [facility];  Service: Cardiovascular;  Laterality: N/A;   CORONARY STENT INTERVENTION N/A 11/13/2021   Procedure: CORONARY STENT INTERVENTION;  Surgeon: Martinique, Peter M, MD;  Location: [facility];  Service: Cardiovascular;  Laterality: N/A;   INTRAVASCULAR ULTRASOUND/IVUS N/A 11/13/2021   Procedure: Intravascular Ultrasound/IVUS;  Surgeon: Martinique, Peter M, MD;  Location: [facility];  Service: Cardiovascular;  Laterality: N/A;   LEFT HEART CATH AND CORONARY ANGIOGRAPHY N/A 10/29/2021   Procedure: LEFT HEART CATH AND CORONARY ANGIOGRAPHY;  Surgeon: Martinique, Peter M, MD;  Location: [facility];  Service: Cardiovascular;   Laterality: N/A;   RIGHT SHOULDER SURGERY  1998   TRANSURETHRAL RESECTION OF PROSTATE  02/05/2012   Procedure: TRANSURETHRAL RESECTION OF THE PROSTATE WITH GYRUS INSTRUMENTS;  Surgeon: Ailene Rud, MD;  Location: [facility];  Service: Urology;  Laterality: N/A;  WITH GYRUS SUPRAPUBIC TUBE OWER TO MAIN   Family History  Problem Relation Age of Onset   Heart disease Mother    Heart disease Father    Social History   Socioeconomic History   Marital status: Married    Spouse name: Not on file   Number of children: Not on file   Years of education: Not on file   Highest education level: Not on file  Occupational History   Not on file  Tobacco Use   Smoking status: Never   Smokeless tobacco: Never  Vaping Use   Vaping Use: Never used  Substance and Sexual Activity   Alcohol use: Yes    Comment: OCCASIONAL    Drug use: No   Sexual activity: Yes  Other Topics Concern   Not on file  Social History Narrative   Not on file   Social Determinants of Health   Financial Resource Strain: Low Risk  (05/09/2022)   Overall Financial Resource Strain (CARDIA)    Difficulty of Paying Living Expenses: Not hard at all  Food Insecurity: No Food Insecurity (05/09/2022)   Hunger Vital Sign    Worried About Running Out of Food in the Last Year: Never true    [place] in the Last Year: Never true  Transportation Needs: No Transportation Needs (05/09/2022)   PRAPARE - Hydrologist (Medical): No    Lack of Transportation (Non-Medical): No  Physical Activity: Sufficiently Active (05/09/2022)   Exercise Vital Sign    Days of Exercise per Week: 3 days    Minutes of Exercise per Session: 60 min  Stress: No Stress Concern Present (05/09/2022)   [place]    Feeling of Stress : Not at all  Social Connections: Not on file    Tobacco Counseling Counseling given: Not  Answered   Clinical Intake:  Pre-visit preparation completed: Yes  Pain : No/denies pain     Nutritional Status: BMI > 30  Obese Nutritional Risks: None Diabetes: No  How often do you need to have someone help you when you read instructions, pamphlets, or other written materials from your doctor or pharmacy?: 1 - Never  Diabetic? no  Interpreter Needed?: No  Information entered by :: NAllen LPN   Activities of Daily Living    05/09/2022    3:24 PM 11/13/2021    4:30 PM  In your present state of health, do you have any difficulty performing the following activities:  Hearing? 1 0  Comment  no hearing aids   Vision? 1 0  Comment can't see out of left eye   Difficulty concentrating or making decisions? 1 0  Walking or climbing stairs? 1 0  Dressing or bathing? 0 0  Doing errands, shopping? 0 0  Preparing Food and eating ? N   Using the Toilet? N   In the past six months, have you accidently leaked urine? N   Do you have problems with loss of bowel control? N   Managing your Medications? N   Managing your Finances? N   Housekeeping or managing your Housekeeping? N     Patient Care Team: Libby Maw, MD as PCP - General (Family Medicine) Satira Sark, MD as PCP - Cardiology (Cardiology)  Indicate any recent Medical Services you may have received from other than [facility] providers in the past year (date may be approximate).     Assessment:   This is a routine wellness examination for Parminder.  Hearing/Vision screen Vision Screening - Comments:: Regular eye exams, [facility]  Dietary issues and exercise activities discussed: Current Exercise Habits: Home exercise routine, Type of exercise: walking;Other - see comments (stationary bike), Time (Minutes): 60, Frequency (Times/Week): 3, Weekly Exercise (Minutes/Week): 180   Goals Addressed             This Visit's Progress    Patient Stated       05/09/2022, no goals       Depression  Screen    05/09/2022    3:24 PM 01/07/2022   11:01 AM  PHQ 2/9 Scores  PHQ - 2 Score 0 0  PHQ- 9 Score  3    Fall Risk    05/09/2022    3:23 PM 12/02/2018   11:23 AM  Fall Risk   Falls in the past year? 0 0  Comment  Emmi Telephone Survey: data to providers prior to load  Number falls in past yr: 0   Injury with Fall? 0   Risk for fall due to : Medication side effect   Follow up Falls prevention discussed;Education provided;Falls evaluation completed     FALL RISK PREVENTION PERTAINING TO THE HOME:  Any stairs in or around the home? Yes  If so, are there any without handrails? No  Home free of loose throw rugs in walkways, pet beds, electrical cords, etc? Yes  Adequate lighting in your home to reduce risk of falls? Yes   ASSISTIVE DEVICES UTILIZED TO PREVENT FALLS:  Life alert? No  Use of a cane, walker or w/c? No  Grab bars in the bathroom? Yes  Shower chair or bench in shower? Yes  Elevated toilet seat or a handicapped toilet? No   TIMED UP AND GO:  Was the test performed? Yes .  Length of time to ambulate 10 feet: 5 sec.   Gait steady and fast without use of assistive device  Cognitive Function:        05/09/2022    3:27 PM  6CIT Screen  What Year? 0 points  What month? 0 points  What time? 0 points  Count back from 20 0 points  Months in reverse 2 points  Repeat phrase 4 points  Total Score 6 points    Immunizations Immunization History  Administered Date(s) Administered   Fluad Quad(high Dose 65+) 02/20/2022   Influenza Split 02/06/2012   Moderna Sars-Covid-2 Vaccination 10/23/2020   PFIZER(Purple Top)SARS-COV-2 Vaccination 06/27/2019, 07/18/2019, 02/10/2020, 03/05/2021   Pneumococcal Conjugate-13 03/23/2014   Unspecified SARS-COV-2 Vaccination  03/04/2022   Zoster Recombinat (Shingrix) 07/08/2021, 09/19/2021    TDAP status: Due, Education has been provided regarding the importance of this vaccine. Advised may receive this vaccine at local pharmacy  or Health Dept. Aware to provide a copy of the vaccination record if obtained from local pharmacy or Health Dept. Verbalized acceptance and understanding.  Flu Vaccine status: Up to date  Pneumococcal vaccine status: Up to date  Covid-19 vaccine status: Completed vaccines  Qualifies for Shingles Vaccine? Yes   Zostavax completed No   Shingrix Completed?: Yes  Screening Tests Health Maintenance  Topic Date Due   DTaP/Tdap/Td (1 - Tdap) Never done   Pneumonia Vaccine 61+ Years old (2 - PPSV23 or PCV20) 03/24/2015   COVID-19 Vaccine (7 - 2023-24 season) 04/29/2022   Medicare Annual Wellness (AWV)  05/10/2023   INFLUENZA VACCINE  Completed   Zoster Vaccines- Shingrix  Completed   HPV VACCINES  Aged Out    Health Maintenance  Health Maintenance Due  Topic Date Due   DTaP/Tdap/Td (1 - Tdap) Never done   Pneumonia Vaccine 52+ Years old (2 - PPSV23 or PCV20) 03/24/2015   COVID-19 Vaccine (7 - 2023-24 season) 04/29/2022    Colorectal cancer screening: No longer required.   Lung Cancer Screening: (Low Dose CT Chest recommended if Age 84-80 years, 30 pack-year currently smoking OR have quit w/in 15years.) does not qualify.   Lung Cancer Screening Referral: no  Additional Screening:  Hepatitis C Screening: does not qualify;   Vision Screening: Recommended annual ophthalmology exams for early detection of glaucoma and other disorders of the eye. Is the patient up to date with their annual eye exam?  Yes  Who is the provider or what is the name of the office in which the patient attends annual eye exams? [facility]  If pt is not established with a provider, would they like to be referred to a provider to establish care? No .   Dental Screening: Recommended annual dental exams for proper oral hygiene  Community Resource Referral / Chronic Care Management: CRR required this visit?  No   CCM required this visit?  No      Plan:     I have personally reviewed and noted the  following in the patient's chart:   Medical and social history Use of alcohol, tobacco or illicit drugs  Current medications and supplements including opioid prescriptions. Patient is not currently taking opioid prescriptions. Functional ability and status Nutritional status Physical activity Advanced directives List of other physicians Hospitalizations, surgeries, and ER visits in previous 12 months Vitals Screenings to include cognitive, depression, and falls Referrals and appointments  In addition, I have reviewed and discussed with patient certain preventive protocols, quality metrics, and best practice recommendations. A written personalized care plan for preventive services as well as general preventive health recommendations were provided to patient.     Kellie Simmering, LPN   08/03/5828   Nurse Notes: none

## 2022-05-13 ENCOUNTER — Ambulatory Visit (INDEPENDENT_AMBULATORY_CARE_PROVIDER_SITE_OTHER): Payer: Medicare Other | Admitting: Family Medicine

## 2022-05-13 ENCOUNTER — Encounter: Payer: Self-pay | Admitting: Family Medicine

## 2022-05-13 VITALS — BP 124/82 | HR 75 | Temp 97.8°F | Wt 190.4 lb

## 2022-05-13 DIAGNOSIS — R21 Rash and other nonspecific skin eruption: Secondary | ICD-10-CM | POA: Diagnosis not present

## 2022-05-13 DIAGNOSIS — I25118 Atherosclerotic heart disease of native coronary artery with other forms of angina pectoris: Secondary | ICD-10-CM

## 2022-05-13 MED ORDER — TRIAMCINOLONE ACETONIDE 0.1 % EX CREA: 1.0000 | TOPICAL_CREAM | Freq: Two times a day (BID) | CUTANEOUS | 0 refills | Status: DC

## 2022-05-13 NOTE — Progress Notes (Signed)
Assessment/Plan:   Problem List Items Addressed This Visit       Cardiovascular and Mediastinum   CAD (coronary artery disease)    The patient's cardiovascular status is stable with no acute concerns. Given the lack of symptomatic presentation, this problem does not require an acute intervention at this visit.        Musculoskeletal and Integument   Rash - Primary    There is a significant improvement of the lower leg rash with triamcinolone ointment. The differential diagnosis for the rash may include contact dermatitis (L23), atopic dermatitis (L20), and psoriasis (L40). The lesion's clinical presentation and response to topical steroids hint towards a possible inflammatory etiology, such as a form of eczema or contact dermatitis, though psoriatic patches cannot be ruled out without further evaluation.  Plan: Continue the application of triamcinolone ointment and monitor progress. It is crucial to observe for signs of skin atrophy or other adverse effects due to prolonged steroid use. A follow-up in 6 months is scheduled to reassess blood pressure and the response of the rash to ongoing treatment. Discuss potential side effects associated with chronic topical steroid use.      Relevant Medications   triamcinolone cream (KENALOG) 0.1 %       Subjective:   CHIEF COMPLAINT: Patient presents to establish care with complaints of a persistent rash on the right and left lower legs, previously treated with triamcinolone acetonide cream with significant improvement reported.  HISTORY OF PRESENT ILLNESS:  Patient first wife of 32 years passed away approximately 3 years ago, states that he had significant depression in his life was "a wreck" patient recently remarried a few months ago, he reports that he is very happy, he and his new wife travel a lot they have been to [place] in [place] and are planning a trip to [place].   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 He also reports a history of BPH, GERD, hepatitis B, and urgency of urination. The patient notably complains of a lower leg rash present for 2 months, associated with itching and flaking. The rash started after a change in socks, which was suspected as the initial trigger. Despite changing back, the rash persisted. Topical triamcinolone cream application led to significant improvement. On current examination, a few small erythematous, nontender patches without flaking were observed.  Problem 2: The patient has a medical history of cardiovascular conditions, including CAD with previous stent placements. He is currently on metoprolol, aspirin, and Plavix and denies present symptoms of chest pain, shortness of breath, or lower extremity swelling.  REVIEW OF SYSTEMS: Pertinent positives include a rash on the lower extremities, a history of CAD without current symptoms, and well-controlled hypertension. All other systems are reported negative as per the patient's verbal history, with no new or concerning symptoms outside of the documented problems. Patient with a history of CAD status post stent cardiology on metoprolol, aspirin, Plavix, denies chest pain or shortness of breath or lower extremity swelling today  Patient has had rash for 2 months lower extremities with itching and flaking, patient failed OTC remedies patient started using topical triamcinolone cream 0.1% ointment Christmas 2023 that he was given by his brother-in-law, reports that rash significantly improved, on exam there are a few small  erythematous patches without flaking they are nontender, surrounding skin is normal-appearing patient denies systemic symptoms of fever, joint pain, ankle  swelling, pain, joint pain  Patient was seeing Dr. [place] Crane call in [facility] where he was prescribed temazepam 30 mg for insomnia nightly, this was observed in chart history and reviewing the PDMP, this was not discussed during appointment, patient last had prescription filled 02/2022  Hypertension well-controlled on amlodipine  Patient with history of "short-term memory loss", previously on Namenda 10 mg twice daily, reports no improvement in the symptoms and he self discontinued this.  Past Surgical History:  Procedure Laterality Date   CORONARY CTO INTERVENTION N/A 11/13/2021   Procedure: CORONARY CTO INTERVENTION;  Surgeon: Martinique, Peter M, MD;  Location: [facility];  Service: Cardiovascular;  Laterality: N/A;   CORONARY STENT INTERVENTION N/A 10/29/2021   Procedure: CORONARY STENT INTERVENTION;  Surgeon: Martinique, Peter M, MD;  Location: [facility];  Service: Cardiovascular;  Laterality: N/A;   CORONARY STENT INTERVENTION N/A 11/13/2021   Procedure: CORONARY STENT INTERVENTION;  Surgeon: Martinique, Peter M, MD;  Location: [facility];  Service: Cardiovascular;  Laterality: N/A;   INTRAVASCULAR ULTRASOUND/IVUS N/A 11/13/2021   Procedure: Intravascular Ultrasound/IVUS;  Surgeon: Martinique, Peter M, MD;  Location: [facility];  Service: Cardiovascular;  Laterality: N/A;   LEFT HEART CATH AND CORONARY ANGIOGRAPHY N/A 10/29/2021   Procedure: LEFT HEART CATH AND CORONARY ANGIOGRAPHY;  Surgeon: Martinique, Peter M, MD;  Location: [facility];  Service: Cardiovascular;  Laterality: N/A;   RIGHT SHOULDER SURGERY  1998   TRANSURETHRAL RESECTION OF PROSTATE  02/05/2012   Procedure: TRANSURETHRAL RESECTION OF THE PROSTATE WITH GYRUS INSTRUMENTS;  Surgeon: Ailene Rud, MD;  Location: [facility];  Service: Urology;  Laterality: N/A;  WITH GYRUS SUPRAPUBIC TUBE OWER TO MAIN    Outpatient Medications Prior to Visit  Medication Sig Dispense  Refill   amLODipine (NORVASC) 10 MG tablet Take 10 mg by mouth daily.     aspirin EC 81 MG tablet Take 81 mg by mouth daily.     atorvastatin (LIPITOR) 80 MG tablet Take 1 tablet (80 mg total) by mouth daily. 90 tablet 3   clopidogrel (PLAVIX) 75 MG tablet Take 75 mg by mouth daily.     Magnesium 400 MG TABS Take 400 mg by mouth daily.     pantoprazole (PROTONIX) 40 MG tablet Take 40 mg by mouth daily before breakfast.     temazepam (RESTORIL) 30 MG capsule Take 30 mg by mouth at bedtime as needed for sleep.     celecoxib (CELEBREX) 100 MG capsule Take 1 capsule (100 mg total) by mouth 2 (two) times daily. (Patient not taking: Reported on 05/09/2022) 60 capsule 6   memantine (NAMENDA) 10 MG tablet Take 10 mg by mouth 2 (two) times daily. (Patient not taking: Reported on 05/09/2022)     nitroGLYCERIN (NITROSTAT) 0.4 MG SL tablet Place 1 tablet (0.4 mg total) under the tongue every 5 (five) minutes as needed for chest pain. 25 tablet 11   No facility-administered medications prior to visit.    Family History  Problem Relation Age of Onset   Heart disease Mother    Heart disease Father     Social History   Socioeconomic History   Marital status: Married    Spouse name: Not on file   Number of children: Not on file   Years of education: Not on file   Highest  education level: Not on file  Occupational History   Not on file  Tobacco Use   Smoking status: Never    Passive exposure: Never   Smokeless tobacco: Never  Vaping Use   Vaping Use: Never used  Substance and Sexual Activity   Alcohol use: Yes    Comment: OCCASIONAL    Drug use: No   Sexual activity: Yes  Other Topics Concern   Not on file  Social History Narrative   Not on file   Social Determinants of Health   Financial Resource Strain: Low Risk  (05/09/2022)   Overall Financial Resource Strain (CARDIA)    Difficulty of Paying Living Expenses: Not hard at all  Food Insecurity: No Food Insecurity (05/09/2022)   Hunger  Vital Sign    Worried About Running Out of Food in the Last Year: Never true    Ran Out of Food in the Last Year: Never true  Transportation Needs: No Transportation Needs (05/09/2022)   PRAPARE - Hydrologist (Medical): No    Lack of Transportation (Non-Medical): No  Physical Activity: Sufficiently Active (05/09/2022)   Exercise Vital Sign    Days of Exercise per Week: 3 days    Minutes of Exercise per Session: 60 min  Stress: No Stress Concern Present (05/09/2022)   [place]    Feeling of Stress : Not at all  Social Connections: Not on file  Intimate Partner Violence: Not on file                                                                                                 Objective:  Physical Exam: BP 124/82 (BP Location: Left Arm, Patient Position: Sitting, Cuff Size: Large)   Pulse 75   Temp 97.8 F (36.6 C) (Temporal)   Wt 190 lb 6.4 oz (86.4 kg)   SpO2 99%   BMI 29.82 kg/m    General: No acute distress. Awake and conversant.  Eyes: Normal conjunctiva, anicteric. Round symmetric pupils.  ENT: Hearing grossly intact. No nasal discharge.  Neck: Neck is supple. No masses or thyromegaly.  Respiratory: Respirations are non-labored. No auditory wheezing.  Skin: Warm.  Skin: Rash on lower extremities, 1 small patch bilateral erythema, nontender to palpation no flaking partially improving with treatment Psych: Alert and oriented. Cooperative, Appropriate mood and affect, Normal judgment.  CV: No cyanosis or JVD, RRR, no MRG MSK: Normal ambulation. No clubbing  Neuro: Sensation and CN II-XII grossly normal.        Alesia Banda, MD, MS

## 2022-05-18 DIAGNOSIS — R21 Rash and other nonspecific skin eruption: Secondary | ICD-10-CM | POA: Insufficient documentation

## 2022-05-18 NOTE — Assessment & Plan Note (Signed)
The patient's cardiovascular status is stable with no acute concerns. Given the lack of symptomatic presentation, this problem does not require an acute intervention at this visit.

## 2022-05-18 NOTE — Assessment & Plan Note (Signed)
There is a significant improvement of the lower leg rash with triamcinolone ointment. The differential diagnosis for the rash may include contact dermatitis (L23), atopic dermatitis (L20), and psoriasis (L40). The lesion's clinical presentation and response to topical steroids hint towards a possible inflammatory etiology, such as a form of eczema or contact dermatitis, though psoriatic patches cannot be ruled out without further evaluation.  Plan: Continue the application of triamcinolone ointment and monitor progress. It is crucial to observe for signs of skin atrophy or other adverse effects due to prolonged steroid use. A follow-up in 6 months is scheduled to reassess blood pressure and the response of the rash to ongoing treatment. Discuss potential side effects associated with chronic topical steroid use.

## 2022-06-17 ENCOUNTER — Telehealth: Payer: Self-pay | Admitting: Family Medicine

## 2022-06-17 DIAGNOSIS — I25118 Atherosclerotic heart disease of native coronary artery with other forms of angina pectoris: Secondary | ICD-10-CM

## 2022-06-17 MED ORDER — CLOPIDOGREL BISULFATE 75 MG PO TABS: 75.0000 mg | ORAL_TABLET | Freq: Every day | ORAL | 0 refills | Status: AC

## 2022-06-17 NOTE — Telephone Encounter (Signed)
Prescription Request  06/17/2022  Is this a "Controlled Substance" medicine? No  LOV: 05/13/2022  What is the name of the medication or equipment? clopidogrel (PLAVIX) 75 MG tablet [id number]   Have you contacted your pharmacy to request a refill? No   Which pharmacy would you like this sent to?    [id number] [address], [address] AT [facility] OF [address] [place] [address] Phone: [phone] Fax: [phone]   Patient notified that their request is being sent to the clinical staff for review and that they should receive a response within 2 business days.   Please advise at Mobile [phone] (mobile)

## 2022-06-26 DIAGNOSIS — L570 Actinic keratosis: Secondary | ICD-10-CM | POA: Diagnosis not present

## 2022-06-26 DIAGNOSIS — X32XXXD Exposure to sunlight, subsequent encounter: Secondary | ICD-10-CM | POA: Diagnosis not present

## 2022-06-26 DIAGNOSIS — L308 Other specified dermatitis: Secondary | ICD-10-CM | POA: Diagnosis not present

## 2022-06-26 DIAGNOSIS — L708 Other acne: Secondary | ICD-10-CM | POA: Diagnosis not present

## 2022-07-10 DIAGNOSIS — M25551 Pain in right hip: Secondary | ICD-10-CM | POA: Diagnosis not present

## 2022-07-10 DIAGNOSIS — M25552 Pain in left hip: Secondary | ICD-10-CM | POA: Diagnosis not present

## 2022-07-10 DIAGNOSIS — M545 Low back pain, unspecified: Secondary | ICD-10-CM | POA: Diagnosis not present

## 2022-09-08 NOTE — Progress Notes (Unsigned)
Cardiology Clinic Note   Patient Name: Philip Allen [facility] Date of Encounter: 09/09/2022  Primary Care Provider:  Garnette Gunner, MD Primary Cardiologist:  Nona Dell, MD  Patient Profile    Philip Allen 82 year old male presents to the clinic today for follow-up evaluation of his coronary artery disease and hyperlipidemia.  Past Medical History    Past Medical History:  Diagnosis Date   BPH (benign prostatic hypertrophy)    GERD (gastroesophageal reflux disease)    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   Hyperlipidemia    Hypertension    Urgency of urination    Past Surgical History:  Procedure Laterality Date   CORONARY CTO INTERVENTION N/A 11/13/2021   Procedure: CORONARY CTO INTERVENTION;  Surgeon: Swaziland, Peter M, MD;  Location: [facility] INVASIVE CV LAB;  Service: Cardiovascular;  Laterality: N/A;   CORONARY STENT INTERVENTION N/A 10/29/2021   Procedure: CORONARY STENT INTERVENTION;  Surgeon: Swaziland, Peter M, MD;  Location: [facility] INVASIVE CV LAB;  Service: Cardiovascular;  Laterality: N/A;   CORONARY STENT INTERVENTION N/A 11/13/2021   Procedure: CORONARY STENT INTERVENTION;  Surgeon: Swaziland, Peter M, MD;  Location: [facility] INVASIVE CV LAB;  Service: Cardiovascular;  Laterality: N/A;   CORONARY ULTRASOUND/IVUS N/A 11/13/2021   Procedure: Intravascular Ultrasound/IVUS;  Surgeon: Swaziland, Peter M, MD;  Location: [facility] INVASIVE CV LAB;  Service: Cardiovascular;  Laterality: N/A;   LEFT HEART CATH AND CORONARY ANGIOGRAPHY N/A 10/29/2021   Procedure: LEFT HEART CATH AND CORONARY ANGIOGRAPHY;  Surgeon: Swaziland, Peter M, MD;  Location: [facility] INVASIVE CV LAB;  Service: Cardiovascular;  Laterality: N/A;   RIGHT SHOULDER SURGERY  1998   TRANSURETHRAL RESECTION OF PROSTATE  02/05/2012   Procedure: TRANSURETHRAL RESECTION OF THE PROSTATE WITH GYRUS INSTRUMENTS;  Surgeon: Kathi Ludwig, MD;  Location: [facility];  Service: Urology;  Laterality: N/A;  WITH GYRUS  SUPRAPUBIC TUBE OWER TO MAIN    Allergies  Allergies  Allergen Reactions   Naproxen Hives and Other (See Comments)    Reaction to Aleve    History of Present Illness    Philip Allen has a PMH of coronary artery disease, angina, hypertension, osteoarthritis, syncope, depression, and HLD.  He was previously seen and evaluated by Dr. Diona Browner for syncope.  His echocardiogram was unremarkable.  He wore a cardiac event monitor which showed PACs and PVCs.  Medical management was recommended.  He was seen in the emergency department 10/05/2021 and complained of chest heaviness in the center of his chest.  It was associated with shortness of breath.  He felt weak and was unable to stand.  He laid down on the ground but did not lose consciousness.  He reported the episode lasted for approximately 10 minutes.  He received 4 baby aspirin and his symptoms resolved about 10 minutes later.  He presented to the emergency department.  His EKG at that time showed no evidence of ACS.  He was noted to have borderline intraventricular conduction delay which was similar to his previous EKG 2021.  He did not wish to be admitted.  His high-sensitivity troponins were negative x 2.  He was seen in follow-up by Bailey Mech, DNP 10/08/2021.  A coronary CTA was scheduled.  He was noted to have a coronary calcium score of 1435 and a subtotal occluded LAD.  He underwent cardiac catheterization which showed occlusion of his LAD after his first diagonal.  It was filled with right to left collaterals from  his RCA.  The lesion was attempted to be crossed.  However, it was unable to be crossed.  He underwent CTO PCI 11/13/2021 and his lesion was able to be crossed with successful DES.  His metoprolol was discontinued at that time due to bradycardia.  He was seen in follow-up by Dr. Swaziland 03/12/2022.  During that time he reported that he had remarried and had a honeymoon in [place].  He was experiencing bilateral hip pain  with exertion.  The symptoms would go away at rest.  He was prescribed Celebrex.  He denied chest pain and dyspnea.  He was planning to have eyelid surgery once he was able to hold his Plavix.  Lower extremity arterial's were ordered but not completed.  He presents to the clinic today for follow-up evaluation and states he has had 2 episodes of chest discomfort that were brief.  He used nitroglycerin with both episodes which relieved his discomfort.  He is somewhat physically active and walks up and down basement stairs daily.  He denies chest pain with walking up and down stairs.  He and his wife are planning to go to [place] in [place] in the fall.  He continues to have intermittent episodes of lower extremity cramping.  He has palpable lower extremity pulses.  I will check a BMP and magnesium level today, have him increase his physical activity as tolerated and plan follow-up in 12 months.  On 11/15/2022 he may stop his aspirin but will need to continue his Plavix.  Today he denies chest pain, shortness of breath, lower extremity edema, fatigue, palpitations, melena, hematuria, hemoptysis, diaphoresis, weakness, presyncope, syncope, orthopnea, and PND.    Home Medications    Prior to Admission medications   Medication Sig Start Date End Date Taking? Authorizing Provider  amLODipine (NORVASC) 10 MG tablet Take 10 mg by mouth daily.    [provider]  aspirin EC 81 MG tablet Take 81 mg by mouth daily.    [provider]  atorvastatin (LIPITOR) 80 MG tablet Take 1 tablet (80 mg total) by mouth daily. 11/14/21   Swaziland, Peter M, MD  celecoxib (CELEBREX) 100 MG capsule Take 1 capsule (100 mg total) by mouth 2 (two) times daily. Patient not taking: Reported on 05/09/2022 03/12/22   Swaziland, Peter M, MD  clopidogrel (PLAVIX) 75 MG tablet Take 1 tablet (75 mg total) by mouth daily. 06/17/22 09/15/22  Garnette Gunner, MD  Magnesium 400 MG TABS Take 400 mg by mouth daily.    [provider]  memantine (NAMENDA) 10 MG tablet Take 10 mg by mouth 2 (two) times daily. Patient not taking: Reported on 05/09/2022    [provider]  nitroGLYCERIN (NITROSTAT) 0.4 MG SL tablet Place 1 tablet (0.4 mg total) under the tongue every 5 (five) minutes as needed for chest pain. 11/18/21 02/16/22  Swaziland, Peter M, MD  pantoprazole (PROTONIX) 40 MG tablet Take 40 mg by mouth daily before breakfast.    [provider]  temazepam (RESTORIL) 30 MG capsule Take 30 mg by mouth at bedtime as needed for sleep.    [provider]  triamcinolone cream (KENALOG) 0.1 % Apply 1 Application topically 2 (two) times daily. 05/13/22   Garnette Gunner, MD    Family History    Family History  Problem Relation Age of Onset   Heart disease Mother    Heart disease Father    He indicated that the status of his mother is unknown. He indicated  that the status of his father is unknown.  Social History    Social History   Socioeconomic History   Marital status: Married    Spouse name: Not on file   Number of children: Not on file   Years of education: Not on file   Highest education level: Not on file  Occupational History   Not on file  Tobacco Use   Smoking status: Never    Passive exposure: Never   Smokeless tobacco: Never  Vaping Use   Vaping Use: Never used  Substance and Sexual Activity   Alcohol use: Yes    Comment: OCCASIONAL    Drug use: No   Sexual activity: Yes  Other Topics Concern   Not on file  Social History Narrative   Not on file   Social Determinants of Health   Financial Resource Strain: Low Risk  (05/09/2022)   Overall Financial Resource Strain (CARDIA)    Difficulty of Paying Living Expenses: Not hard at all  Food Insecurity: No Food Insecurity (05/09/2022)   Hunger Vital Sign    Worried About Running Out of Food in the Last Year: Never true    Ran Out of Food in the Last Year: Never true  Transportation Needs: No Transportation Needs  (05/09/2022)   PRAPARE - Administrator, Civil Service (Medical): No    Lack of Transportation (Non-Medical): No  Physical Activity: Sufficiently Active (05/09/2022)   Exercise Vital Sign    Days of Exercise per Week: 3 days    Minutes of Exercise per Session: 60 min  Stress: No Stress Concern Present (05/09/2022)   [employer] of Occupational Health - Occupational Stress Questionnaire    Feeling of Stress : Not at all  Social Connections: Not on file  Intimate Partner Violence: Not on file     Review of Systems    General:  No chills, fever, night sweats or weight changes.  Cardiovascular:  No chest pain, dyspnea on exertion, edema, orthopnea, palpitations, paroxysmal nocturnal dyspnea. Dermatological: No rash, lesions/masses Respiratory: No cough, dyspnea Urologic: No hematuria, dysuria Abdominal:   No nausea, vomiting, diarrhea, bright red blood per rectum, melena, or hematemesis Neurologic:  No visual changes, wkns, changes in mental status. All other systems reviewed and are otherwise negative except as noted above.  Physical Exam    VS:  BP 132/82   Pulse (!) 54   Ht 5\' 6"  (1.676 m)   Wt 192 lb 12.8 oz (87.5 kg)   SpO2 95%   BMI 31.12 kg/m  , BMI Body mass index is 31.12 kg/m. GEN: Well nourished, well developed, in no acute distress. HEENT: normal. Neck: Supple, no JVD, carotid bruits, or masses. Cardiac: RRR, no murmurs, rubs, or gallops. No clubbing, cyanosis, edema.  Radials/DP/PT 2+ and equal bilaterally.  Respiratory:  Respirations regular and unlabored, clear to auscultation bilaterally. GI: Soft, nontender, nondistended, BS + x 4. MS: no deformity or atrophy. Skin: warm and dry, no rash. Neuro:  Strength and sensation are intact. Psych: Normal affect.  Accessory Clinical Findings    Recent Labs: 10/28/2021: ALT 29 11/14/2021: BUN 15; Creatinine, Ser 0.97; Hemoglobin 12.3; Platelets 93; Potassium 4.0; Sodium 138   Recent Lipid Panel     Component Value Date/Time   CHOL 135 10/28/2021 1139   TRIG 105 10/28/2021 1139   HDL 54 10/28/2021 1139   CHOLHDL 2.5 10/28/2021 1139   LDLCALC 62 10/28/2021 1139         ECG personally reviewed  by me today-sinus bradycardia 54 bpm no ectopy- No acute changes Echocardiogram 08/06/2019 IMPRESSIONS     1. Left ventricular ejection fraction, by estimation, is 50 to 55%. The  left ventricle has low normal function. The left ventricle has no regional  wall motion abnormalities. There is moderate left ventricular hypertrophy.  Left ventricular diastolic  parameters are consistent with Grade I diastolic dysfunction (impaired  relaxation).   2. Right ventricular systolic function is normal. The right ventricular  size is normal.   3. The mitral valve is abnormal. Trivial mitral valve regurgitation.   4. The aortic valve is tricuspid. Aortic valve regurgitation is not  visualized. Mild aortic valve sclerosis is present, with no evidence of  aortic valve stenosis.   FINDINGS   Left Ventricle: Left ventricular ejection fraction, by estimation, is 50  to 55%. The left ventricle has low normal function. The left ventricle has  no regional wall motion abnormalities. The left ventricular internal  cavity size was normal in size.  There is moderate left ventricular hypertrophy. Left ventricular diastolic  parameters are consistent with Grade I diastolic dysfunction (impaired  relaxation). Indeterminate filling pressures.   Right Ventricle: The right ventricular size is normal. No increase in  right ventricular wall thickness. Right ventricular systolic function is  normal.   Left Atrium: Left atrial size was normal in size.   Right Atrium: Right atrial size was normal in size.   Pericardium: There is no evidence of pericardial effusion.   Mitral Valve: The mitral valve is abnormal. There is mild thickening of  the mitral valve leaflet(s). Trivial mitral valve regurgitation. MV peak   gradient, 4.8 mmHg. The mean mitral valve gradient is 1.0 mmHg.   Tricuspid Valve: The tricuspid valve is grossly normal. Tricuspid valve  regurgitation is trivial.   Aortic Valve: The aortic valve is tricuspid. Aortic valve regurgitation is  not visualized. Mild aortic valve sclerosis is present, with no evidence  of aortic valve stenosis. Aortic valve mean gradient measures 3.0 mmHg.  Aortic valve peak gradient  measures 5.1 mmHg. Aortic valve area, by VTI measures 2.63 cm.   Pulmonic Valve: The pulmonic valve was grossly normal. Pulmonic valve  regurgitation is trivial.   Aorta: The aortic root and ascending aorta are structurally normal, with  no evidence of dilitation.   IAS/Shunts: No atrial level shunt detected by color flow Doppler.    Cardiac catheterization 11/13/2021    Prox LAD to Mid LAD lesion is 100% stenosed.   A drug-eluting stent was successfully placed using a SYNERGY XD 2.75X28.   A drug-eluting stent was successfully placed using a SYNERGY XD 3.0X38.   Post intervention, there is a 0% residual stenosis.   Successful CTO PCI of the mid to distal LAD using IVUS guidance and overlapping DES x 2.   Plan: observe overnight. Anticipate DC in am. Plan DAPT for one year.   Diagnostic Dominance: Right  Intervention     Assessment & Plan   1.  Coronary artery disease-no chest pain today.  Denies dyspnea.  Underwent cardiac catheterization and PCI to CTO 11/13/2021.  He was successfully stented (LADx2).  His metoprolol was discontinued due to bradycardia. Continue aspirin, Plavix, atorvastatin, amlodipine Heart healthy low-sodium diet-salty 6 given Increase physical activity as tolerated  Hyperlipidemia-LDL 62 on 10/28/2021. High-fiber diet Continue medical therapy Repeat fasting lipids and LFTs  Bilateral hip pain, leg cramps-continues to note episodes of hip discomfort with increased physical activity.    Reviewed option for lower extremity  arterial's.  Wishes to defer at this time. Continue aspirin, Plavix, atorvastatin Order BMP, magnesium  Essential hypertension-BP today 132/82. Continue amlodipine Blood pressure log Low-sodium diet  Disposition: Follow-up with Dr. Swaziland or me in 12 months.   Thomasene Ripple. Shermika Balthaser NP-C     09/09/2022, 9:44 AM [facility] [address] Office [phone] Fax [phone]    I spent 14 minutes examining this patient, reviewing medications, and using patient centered shared decision making involving her cardiac care.  Prior to her visit I spent greater than 20 minutes reviewing her past medical history,  medications, and prior cardiac tests.

## 2022-09-09 ENCOUNTER — Encounter: Payer: Self-pay | Admitting: General Practice

## 2022-09-09 ENCOUNTER — Ambulatory Visit: Payer: Medicare Other | Attending: General Practice | Admitting: General Practice

## 2022-09-09 VITALS — BP 132/82 | HR 54 | Ht 66.0 in | Wt 192.8 lb

## 2022-09-09 DIAGNOSIS — M25551 Pain in right hip: Secondary | ICD-10-CM | POA: Insufficient documentation

## 2022-09-09 DIAGNOSIS — E7800 Pure hypercholesterolemia, unspecified: Secondary | ICD-10-CM | POA: Insufficient documentation

## 2022-09-09 DIAGNOSIS — M25552 Pain in left hip: Secondary | ICD-10-CM | POA: Diagnosis not present

## 2022-09-09 DIAGNOSIS — I25118 Atherosclerotic heart disease of native coronary artery with other forms of angina pectoris: Secondary | ICD-10-CM | POA: Diagnosis not present

## 2022-09-09 DIAGNOSIS — R252 Cramp and spasm: Secondary | ICD-10-CM | POA: Diagnosis not present

## 2022-09-09 DIAGNOSIS — I10 Essential (primary) hypertension: Secondary | ICD-10-CM | POA: Diagnosis not present

## 2022-09-09 LAB — LIPID PANEL
Chol/HDL Ratio: 2.5 ratio (ref 0.0–5.0)
Cholesterol, Total: 142 mg/dL (ref 100–199)
HDL: 57 mg/dL (ref >39)
LDL Chol Calc (NIH): 68 mg/dL (ref 0–99)
Triglycerides: 92 mg/dL (ref 0–149)
VLDL Cholesterol Cal: 17 mg/dL (ref 5–40)

## 2022-09-09 LAB — HEPATIC FUNCTION PANEL
ALT: 29 IU/L (ref 0–44)
AST: 28 IU/L (ref 0–40)
Albumin: 4.7 g/dL (ref 3.7–4.7)
Alkaline Phosphatase: 105 IU/L (ref 44–121)
Bilirubin Total: 0.7 mg/dL (ref 0.0–1.2)
Bilirubin, Direct: 0.21 mg/dL (ref 0.00–0.40)
Total Protein: 6.7 g/dL (ref 6.0–8.5)

## 2022-09-09 LAB — BASIC METABOLIC PANEL
BUN/Creatinine Ratio: 16 (ref 10–24)
BUN: 18 mg/dL (ref 8–27)
CO2: 23 mmol/L (ref 20–29)
Calcium: 10.4 mg/dL — ABNORMAL HIGH (ref 8.6–10.2)
Chloride: 104 mmol/L (ref 96–106)
Creatinine, Ser: 1.12 mg/dL (ref 0.76–1.27)
Glucose: 111 mg/dL — ABNORMAL HIGH (ref 70–99)
Potassium: 4.8 mmol/L (ref 3.5–5.2)
Sodium: 140 mmol/L (ref 134–144)
eGFR: 65 mL/min/{1.73_m2} (ref >59)

## 2022-09-09 LAB — MAGNESIUM: Magnesium: 2.2 mg/dL (ref 1.6–2.3)

## 2022-09-09 MED ORDER — NITROGLYCERIN 0.4 MG SL SUBL: 0.4000 mg | SUBLINGUAL_TABLET | SUBLINGUAL | 11 refills | Status: AC | PRN

## 2022-09-09 NOTE — Patient Instructions (Addendum)
Medication Instructions:  MAY STOP ASPIRIN ON 11-15-2022 *If you need a refill on your cardiac medications before your next appointment, please call your pharmacy*   Lab Work: MAG, LFT,LIPID AND BMET TODAY If you have labs (blood work) drawn today and your tests are completely normal, you will receive your results only by:  MyChart Message (if you have MyChart) OR  A paper copy in the mail If you have any lab test that is abnormal or we need to change your treatment, we will call you to review the results.  Other Instructions INCREASE PHYSICAL ACTIVITY-AS TOLERATED   Follow-Up: At [facility], you and your health needs are our priority.  As part of our continuing mission to provide you with exceptional heart care, we have created designated Provider Care Teams.  These Care Teams include your primary Cardiologist (physician) and Advanced Practice Providers (APPs -  Physician Assistants and Nurse Practitioners) who all work together to provide you with the care you need, when you need it.  We recommend signing up for the patient portal called "MyChart".  Sign up information is provided on this After Visit Summary.  MyChart is used to connect with patients for Virtual Visits (Telemedicine).  Patients are able to view lab/test results, encounter notes, upcoming appointments, etc.  Non-urgent messages can be sent to your provider as well.   To learn more about what you can do with MyChart, go to [email or web address].    Your next appointment:   6-12 month(s)  Provider:   Peter Swaziland, MD

## 2022-11-11 ENCOUNTER — Encounter: Payer: Self-pay | Admitting: Family Medicine

## 2022-11-11 ENCOUNTER — Ambulatory Visit (INDEPENDENT_AMBULATORY_CARE_PROVIDER_SITE_OTHER): Payer: Medicare Other | Admitting: Family Medicine

## 2022-11-11 VITALS — BP 132/84 | HR 55 | Temp 97.8°F | Wt 191.4 lb

## 2022-11-11 DIAGNOSIS — Z23 Encounter for immunization: Secondary | ICD-10-CM | POA: Diagnosis not present

## 2022-11-11 DIAGNOSIS — K219 Gastro-esophageal reflux disease without esophagitis: Secondary | ICD-10-CM | POA: Diagnosis not present

## 2022-11-11 DIAGNOSIS — I209 Angina pectoris, unspecified: Secondary | ICD-10-CM

## 2022-11-11 DIAGNOSIS — N401 Enlarged prostate with lower urinary tract symptoms: Secondary | ICD-10-CM | POA: Diagnosis not present

## 2022-11-11 DIAGNOSIS — E785 Hyperlipidemia, unspecified: Secondary | ICD-10-CM

## 2022-11-11 DIAGNOSIS — R351 Nocturia: Secondary | ICD-10-CM

## 2022-11-11 DIAGNOSIS — I739 Peripheral vascular disease, unspecified: Secondary | ICD-10-CM

## 2022-11-11 DIAGNOSIS — R21 Rash and other nonspecific skin eruption: Secondary | ICD-10-CM

## 2022-11-11 DIAGNOSIS — M5440 Lumbago with sciatica, unspecified side: Secondary | ICD-10-CM

## 2022-11-11 DIAGNOSIS — I25118 Atherosclerotic heart disease of native coronary artery with other forms of angina pectoris: Secondary | ICD-10-CM

## 2022-11-11 DIAGNOSIS — R252 Cramp and spasm: Secondary | ICD-10-CM

## 2022-11-11 DIAGNOSIS — I10 Essential (primary) hypertension: Secondary | ICD-10-CM | POA: Diagnosis not present

## 2022-11-11 DIAGNOSIS — G8929 Other chronic pain: Secondary | ICD-10-CM

## 2022-11-11 LAB — BASIC METABOLIC PANEL
BUN: 18 mg/dL (ref 6–23)
CO2: 27 mEq/L (ref 19–32)
Calcium: 9.8 mg/dL (ref 8.4–10.5)
Chloride: 105 mEq/L (ref 96–112)
Creatinine, Ser: 1.07 mg/dL (ref 0.40–1.50)
GFR: 64.04 mL/min (ref >60.00)
Glucose, Bld: 111 mg/dL — ABNORMAL HIGH (ref 70–99)
Potassium: 4.1 mEq/L (ref 3.5–5.1)
Sodium: 139 mEq/L (ref 135–145)

## 2022-11-11 LAB — MICROALBUMIN / CREATININE URINE RATIO
Creatinine,U: 171.8 mg/dL
Microalb Creat Ratio: 0.6 mg/g (ref 0.0–30.0)
Microalb, Ur: 1 mg/dL (ref 0.0–1.9)

## 2022-11-11 LAB — PSA: PSA: 2.69 ng/mL (ref 0.10–4.00)

## 2022-11-11 MED ORDER — CLOPIDOGREL BISULFATE 75 MG PO TABS
75.0000 mg | ORAL_TABLET | Freq: Every day | ORAL | 3 refills | Status: DC
Start: 2022-11-11 — End: 2023-04-08

## 2022-11-11 MED ORDER — ATORVASTATIN CALCIUM 80 MG PO TABS: 80.0000 mg | ORAL_TABLET | Freq: Every day | ORAL | 3 refills | Status: DC

## 2022-11-11 MED ORDER — ASPIRIN 81 MG PO TBEC
81.0000 mg | DELAYED_RELEASE_TABLET | Freq: Every day | ORAL | 3 refills | Status: AC
Start: 2022-11-11 — End: 2023-11-06

## 2022-11-11 MED ORDER — PANTOPRAZOLE SODIUM 40 MG PO TBEC
40.0000 mg | DELAYED_RELEASE_TABLET | Freq: Every day | ORAL | 3 refills | Status: DC
Start: 2022-11-11 — End: 2024-03-22

## 2022-11-11 MED ORDER — AMLODIPINE BESYLATE 10 MG PO TABS
10.0000 mg | ORAL_TABLET | Freq: Every day | ORAL | 3 refills | Status: DC
Start: 2022-11-11 — End: 2023-09-01

## 2022-11-11 NOTE — Assessment & Plan Note (Signed)
Hypercalcemia: Mildly elevated calcium on recent labs. No known cause. -Repeat electrolyte panel to evaluate calcium level.

## 2022-11-11 NOTE — Assessment & Plan Note (Signed)
Lower Back Pain: Exacerbated by walking, limiting mobility. Previous imaging and orthopedic consultation. -Refer back to orthopedics for further evaluation and management. -Consider over-the-counter Tylenol for pain management.

## 2022-11-11 NOTE — Assessment & Plan Note (Signed)
Coronary Artery Disease: Stable with no chest pain or shortness of breath. On clopidogrel) and aspirin for stent management. -Continue Plavix and aspirin 81mg  daily. -Refill atorvastatin 80mg  daily for cholesterol management.

## 2022-11-11 NOTE — Assessment & Plan Note (Signed)
Gastroesophageal Reflux Disease: Controlled with pantoprazole. -Continue pantoprazole.

## 2022-11-11 NOTE — Progress Notes (Signed)
Assessment/Plan:   Problem List Items Addressed This Visit       Cardiovascular and Mediastinum   Angina pectoris (HCC)   Relevant Medications   atorvastatin (LIPITOR) 80 MG tablet   aspirin EC 81 MG tablet   amLODipine (NORVASC) 10 MG tablet   Other Relevant Orders   AMB Referral to Chronic Care Management Services   CAD (coronary artery disease)    Coronary Artery Disease: Stable with no chest pain or shortness of breath. On clopidogrel) and aspirin for stent management. -Continue Plavix and aspirin 81mg  daily. -Refill atorvastatin 80mg  daily for cholesterol management.      Relevant Medications   atorvastatin (LIPITOR) 80 MG tablet   clopidogrel (PLAVIX) 75 MG tablet   aspirin EC 81 MG tablet   amLODipine (NORVASC) 10 MG tablet   Hypertension   Relevant Medications   atorvastatin (LIPITOR) 80 MG tablet   aspirin EC 81 MG tablet   amLODipine (NORVASC) 10 MG tablet   Other Relevant Orders   AMB Referral to Chronic Care Management Services   Microalbumin / creatinine urine ratio   Peripheral arterial disease (HCC) - Primary    Peripheral Arterial Disease: Mild symptoms of leg cramps, possibly related to hydration status. Previous imaging showed possible mild disease. -Monitor symptoms. No further intervention at this time.      Relevant Medications   atorvastatin (LIPITOR) 80 MG tablet   clopidogrel (PLAVIX) 75 MG tablet   aspirin EC 81 MG tablet   amLODipine (NORVASC) 10 MG tablet     Digestive   Gastroesophageal reflux disease    Gastroesophageal Reflux Disease: Controlled with pantoprazole. -Continue pantoprazole.      Relevant Medications   pantoprazole (PROTONIX) 40 MG tablet     Nervous and Auditory   Chronic left-sided low back pain with sciatica    Lower Back Pain: Exacerbated by walking, limiting mobility. Previous imaging and orthopedic consultation. -Refer back to orthopedics for further evaluation and management. -Consider over-the-counter  Tylenol for pain management.      Relevant Medications   aspirin EC 81 MG tablet   Other Relevant Orders   Ambulatory referral to Orthopedic Surgery     Musculoskeletal and Integument   Rash    Resolved.  No further intervention required                     Other   Hyperlipidemia with target LDL less than 70   Relevant Medications   atorvastatin (LIPITOR) 80 MG tablet   aspirin EC 81 MG tablet   amLODipine (NORVASC) 10 MG tablet   Other Relevant Orders   AMB Referral to Chronic Care Management Services   Leg cramp   Benign prostatic hyperplasia with nocturia    Nocturia: Waking up three times a night to urinate. History of BPH. -Order urinalysis and creatinine microalbumin ratio to evaluate kidney function and PSA to evaluate prostate health.      Relevant Orders   Urinalysis w microscopic + reflex cultur   PSA   Hypercalcemia    Hypercalcemia: Mildly elevated calcium on recent labs. No known cause. -Repeat electrolyte panel to evaluate calcium level.      Relevant Orders   Basic Metabolic Panel (BMET)   Other Visit Diagnoses     Immunization due       Relevant Orders   Pneumococcal conjugate vaccine 20-valent (Completed)       Medications Discontinued During This Encounter  Medication Reason   celecoxib (CELEBREX) 100 MG  capsule    memantine (NAMENDA) 10 MG tablet    amLODipine (NORVASC) 10 MG tablet Reorder   pantoprazole (PROTONIX) 40 MG tablet Reorder   atorvastatin (LIPITOR) 80 MG tablet Reorder   aspirin EC 81 MG tablet Reorder    Return in about 6 months (around 05/14/2023) for BP, HLD.    Subjective:   Encounter date: 11/11/2022  Philip Allen is a 82 y.o. male who has Syncope; Weakness; Depression; Angina pectoris (HCC); CAD (coronary artery disease); Hypertension; Hyperlipidemia with target LDL less than 70; Osteoarthritis; Rash; Peripheral arterial disease (HCC); Gastroesophageal reflux disease; Leg cramp; Benign prostatic  hyperplasia with nocturia; Chronic left-sided low back pain with sciatica; and Hypercalcemia on their problem list..   He  has a past medical history of BPH (benign prostatic hypertrophy), GERD (gastroesophageal reflux disease), History of hepatitis B, Hyperlipidemia, Hypertension, and Urgency of urination.[name]   He presents with chief complaint of Medical Management of Chronic Issues (6 month f/u b/p. Amlodipine, clopidogrel, atorvastatin and pantoprazole rx refill. Ok with AI) .   Discussed the use of AI scribe software for clinical note transcription with the patient, who gave verbal consent to proceed.  History of Present Illness   The patient, a 82 year old individual with a history of coronary heart disease, hypertension, and benign prostatic hyperplasia (BPH), presented for a six-month follow-up and medication refills. He reported difficulty managing his medications and communicating with his pharmacy, [facility], due to technological challenges.  The patient denied any chest pain or shortness of breath, but reported an increasing shortness of breath with age. He also reported a history of heartburn, which is well-managed with pantoprazole. The patient is on atorvastatin for cholesterol management, with recent labs indicating cholesterol levels at goal. He also reported taking amlodipine for hypertension, clopidogrel for coronary heart disease, and aspirin. The patient denied taking Celebrex, memantine, and temazepam regularly.  The patient reported a history of leg cramps, particularly at night, and lower back pain that worsens with walking. He noted that the back pain limits his ability to walk for more than five to ten minutes at a time. The patient also reported nocturia, waking up three times a night to urinate.  The patient had a rash that improved with triamcinolone ointment. He also reported a small amount of swelling in one foot, but denied any pain or discomfort associated with the  swelling. The patient carries nitroglycerin with him at all times, but reported only needing to use it once or twice when he experienced a sharp chest pain.  The patient has a history of bladder work done by a urologist and has been seen by orthopedics for his back pain. He is planning a trip to [place] and [place], which will involve a significant amount of walking. The patient expressed concern about managing his back pain during these trips.      Review of Systems  Constitutional:  Negative for chills, diaphoresis, fever, malaise/fatigue and weight loss.  HENT:  Negative for congestion, ear discharge, ear pain and hearing loss.   Eyes:  Negative for blurred vision, double vision, photophobia, pain, discharge and redness.  Respiratory:  Negative for cough, sputum production, shortness of breath and wheezing.   Cardiovascular:  Positive for chest pain (Occasional, improved with nitro, none today). Negative for palpitations.  Gastrointestinal:  Negative for abdominal pain, blood in stool, constipation, diarrhea, heartburn, melena, nausea and vomiting.  Genitourinary:  Positive for frequency. Negative for dysuria, flank pain, hematuria and urgency.  Musculoskeletal:  Positive  for back pain. Negative for myalgias.  Skin:  Negative for itching and rash.  Neurological:  Negative for dizziness, tingling, tremors, speech change, seizures, loss of consciousness, weakness and headaches.  Psychiatric/Behavioral:  Negative for depression, hallucinations, memory loss, substance abuse and suicidal ideas. The patient does not have insomnia.   All other systems reviewed and are negative.   Past Surgical History:  Procedure Laterality Date   CORONARY CTO INTERVENTION N/A 11/13/2021   Procedure: CORONARY CTO INTERVENTION;  Surgeon: Swaziland, Peter M, MD;  Location: [facility] INVASIVE CV LAB;  Service: Cardiovascular;  Laterality: N/A;   CORONARY STENT INTERVENTION N/A 10/29/2021   Procedure: CORONARY STENT INTERVENTION;   Surgeon: Swaziland, Peter M, MD;  Location: [facility] INVASIVE CV LAB;  Service: Cardiovascular;  Laterality: N/A;   CORONARY STENT INTERVENTION N/A 11/13/2021   Procedure: CORONARY STENT INTERVENTION;  Surgeon: Swaziland, Peter M, MD;  Location: [facility] INVASIVE CV LAB;  Service: Cardiovascular;  Laterality: N/A;   CORONARY ULTRASOUND/IVUS N/A 11/13/2021   Procedure: Intravascular Ultrasound/IVUS;  Surgeon: Swaziland, Peter M, MD;  Location: [facility] INVASIVE CV LAB;  Service: Cardiovascular;  Laterality: N/A;   LEFT HEART CATH AND CORONARY ANGIOGRAPHY N/A 10/29/2021   Procedure: LEFT HEART CATH AND CORONARY ANGIOGRAPHY;  Surgeon: Swaziland, Peter M, MD;  Location: [facility] INVASIVE CV LAB;  Service: Cardiovascular;  Laterality: N/A;   RIGHT SHOULDER SURGERY  1998   TRANSURETHRAL RESECTION OF PROSTATE  02/05/2012   Procedure: TRANSURETHRAL RESECTION OF THE PROSTATE WITH GYRUS INSTRUMENTS;  Surgeon: Kathi Ludwig, MD;  Location: [facility];  Service: Urology;  Laterality: N/A;  WITH GYRUS SUPRAPUBIC TUBE OWER TO MAIN    Outpatient Medications Prior to Visit  Medication Sig Dispense Refill   Magnesium 400 MG TABS Take 400 mg by mouth daily.     nitroGLYCERIN (NITROSTAT) 0.4 MG SL tablet Place 1 tablet (0.4 mg total) under the tongue every 5 (five) minutes as needed for chest pain. 25 tablet 11   temazepam (RESTORIL) 30 MG capsule Take 30 mg by mouth at bedtime as needed for sleep.     triamcinolone cream (KENALOG) 0.1 % Apply 1 Application topically 2 (two) times daily. 30 g 0   amLODipine (NORVASC) 10 MG tablet Take 10 mg by mouth daily.     aspirin EC 81 MG tablet Take 81 mg by mouth daily. Swallow whole.     atorvastatin (LIPITOR) 80 MG tablet Take 1 tablet (80 mg total) by mouth daily. 90 tablet 3   celecoxib (CELEBREX) 100 MG capsule Take 1 capsule (100 mg total) by mouth 2 (two) times daily. 60 capsule 6   memantine (NAMENDA) 10 MG tablet Take 10 mg by mouth 2 (two) times daily.     pantoprazole  (PROTONIX) 40 MG tablet Take 40 mg by mouth daily before breakfast.     No facility-administered medications prior to visit.    Family History  Problem Relation Age of Onset   Heart disease Mother    Heart disease Father     Social History   Socioeconomic History   Marital status: Married    Spouse name: Not on file   Number of children: Not on file   Years of education: Not on file   Highest education level: Not on file  Occupational History   Not on file  Tobacco Use   Smoking status: Never    Passive exposure: Never   Smokeless tobacco: Never  Vaping Use   Vaping Use: Never used  Substance and  Sexual Activity   Alcohol use: Yes    Comment: OCCASIONAL    Drug use: No   Sexual activity: Yes  Other Topics Concern   Not on file  Social History Narrative   Not on file   Social Determinants of Health   Financial Resource Strain: Low Risk  (05/09/2022)   Overall Financial Resource Strain (CARDIA)    Difficulty of Paying Living Expenses: Not hard at all  Food Insecurity: No Food Insecurity (05/09/2022)   Hunger Vital Sign    Worried About Running Out of Food in the Last Year: Never true    Ran Out of Food in the Last Year: Never true  Transportation Needs: No Transportation Needs (05/09/2022)   PRAPARE - Administrator, Civil Service (Medical): No    Lack of Transportation (Non-Medical): No  Physical Activity: Sufficiently Active (05/09/2022)   Exercise Vital Sign    Days of Exercise per Week: 3 days    Minutes of Exercise per Session: 60 min  Stress: No Stress Concern Present (05/09/2022)   [employer] of Occupational Health - Occupational Stress Questionnaire    Feeling of Stress : Not at all  Social Connections: Not on file  Intimate Partner Violence: Not on file                                                                                                  Objective:  Physical Exam: BP 132/84 (BP Location: Left Arm, Patient Position: Sitting,  Cuff Size: Large)   Pulse (!) 55   Temp 97.8 F (36.6 C) (Temporal)   Wt 191 lb 6.4 oz (86.8 kg)   SpO2 99%   BMI 30.89 kg/m    Physical Exam   CHEST: Lungs clear to auscultation bilaterally. CARDIOVASCULAR: Heart sounds normal on auscultation. EXTREMITIES: Mild dependent edema in lower extremities, more pronounced on one side. Skin integrity intact without ulcers. Capillary refill time normal.     Results   LABS Cholesterol: at goal (09/2022) Liver function: within normal limits (09/2022) Kidney function: stable (09/2022) Magnesium: within normal limits (09/2022) Calcium: elevated (09/2022)  DIAGNOSTIC Peripheral arterial disease evaluation: ABI 0.93, borderline arterial disease (03/2022)      Physical Exam Constitutional:      General: He is not in acute distress.    Appearance: Normal appearance. He is not ill-appearing or toxic-appearing.  HENT:     Head: Normocephalic and atraumatic.     Right Ear: Hearing, tympanic membrane, ear canal and external ear normal. There is no impacted cerumen.     Left Ear: Hearing, tympanic membrane, ear canal and external ear normal. There is no impacted cerumen.     Nose: Nose normal. No congestion.     Mouth/Throat:     Lips: No lesions.     Mouth: Mucous membranes are moist.     Pharynx: Oropharynx is clear. No oropharyngeal exudate.  Eyes:     General: No scleral icterus.       Right eye: No discharge.        Left eye: No discharge.  Conjunctiva/sclera: Conjunctivae normal.     Pupils: Pupils are equal, round, and reactive to light.  Neck:     Thyroid: No thyroid mass, thyromegaly or thyroid tenderness.  Cardiovascular:     Rate and Rhythm: Normal rate and regular rhythm.     Pulses: Normal pulses.     Heart sounds: Normal heart sounds.  Pulmonary:     Effort: Pulmonary effort is normal. No respiratory distress.     Breath sounds: Normal breath sounds.  Abdominal:     General: Abdomen is flat. Bowel sounds are normal.      Palpations: Abdomen is soft.  Musculoskeletal:        General: Normal range of motion.     Cervical back: Normal range of motion.     Right lower leg: 1+ Pitting Edema present.     Left lower leg: 1+ Pitting Edema present.  Lymphadenopathy:     Cervical: No cervical adenopathy.  Skin:    General: Skin is warm and dry.     Capillary Refill: Capillary refill takes less than 2 seconds.     Findings: No rash.  Neurological:     General: No focal deficit present.     Mental Status: He is alert and oriented to person, place, and time. Mental status is at baseline.     Deep Tendon Reflexes:     Reflex Scores:      Patellar reflexes are 2+ on the right side and 2+ on the left side. Psychiatric:        Mood and Affect: Mood normal.        Behavior: Behavior normal.        Thought Content: Thought content normal.        Judgment: Judgment normal.     No results found.  Recent Results (from the past 2160 hour(s))  Lipid panel     Status: None   Collection Time: 09/09/22  9:54 AM  Result Value Ref Range   Cholesterol, Total 142 100 - 199 mg/dL   Triglycerides 92 0 - 149 mg/dL   HDL 57 >95 mg/dL   VLDL Cholesterol Cal 17 5 - 40 mg/dL   LDL Chol Calc (NIH) 68 0 - 99 mg/dL   Chol/HDL Ratio 2.5 0.0 - 5.0 ratio    Comment:                                   T. Chol/HDL Ratio                                             Men  Women                               1/2 Avg.Risk  3.4    3.3                                   Avg.Risk  5.0    4.4                                2X Avg.Risk  9.6    7.1  3X Avg.Risk 23.4   11.0   Hepatic function panel     Status: None   Collection Time: 09/09/22  9:54 AM  Result Value Ref Range   Total Protein 6.7 6.0 - 8.5 g/dL   Albumin 4.7 3.7 - 4.7 g/dL   Bilirubin Total 0.7 0.0 - 1.2 mg/dL   Bilirubin, Direct 1.61 0.00 - 0.40 mg/dL   Alkaline Phosphatase 105 44 - 121 IU/L   AST 28 0 - 40 IU/L   ALT 29 0 - 44 IU/L   Basic metabolic panel     Status: Abnormal   Collection Time: 09/09/22  9:54 AM  Result Value Ref Range   Glucose 111 (H) 70 - 99 mg/dL   BUN 18 8 - 27 mg/dL   Creatinine, Ser 0.96 0.76 - 1.27 mg/dL   eGFR 65 >04 VW/UJW/1.19   BUN/Creatinine Ratio 16 10 - 24   Sodium 140 134 - 144 mmol/L   Potassium 4.8 3.5 - 5.2 mmol/L   Chloride 104 96 - 106 mmol/L   CO2 23 20 - 29 mmol/L   Calcium 10.4 (H) 8.6 - 10.2 mg/dL  Magnesium     Status: None   Collection Time: 09/09/22  9:54 AM  Result Value Ref Range   Magnesium 2.2 1.6 - 2.3 mg/dL        Garner Nash, MD, MS

## 2022-11-11 NOTE — Assessment & Plan Note (Signed)
Peripheral Arterial Disease: Mild symptoms of leg cramps, possibly related to hydration status. Previous imaging showed possible mild disease. -Monitor symptoms. No further intervention at this time.

## 2022-11-11 NOTE — Assessment & Plan Note (Addendum)
Resolved.  No further intervention required

## 2022-11-11 NOTE — Assessment & Plan Note (Signed)
Nocturia: Waking up three times a night to urinate. History of BPH. -Order urinalysis and creatinine microalbumin ratio to evaluate kidney function and PSA to evaluate prostate health.

## 2022-11-11 NOTE — Patient Instructions (Signed)
VISIT SUMMARY:  During your recent visit, we discussed your ongoing health conditions including coronary heart disease, hypertension, benign prostatic hyperplasia (BPH), and lower back pain. We also addressed your concerns about managing your medications and communicating with your pharmacy. We reviewed your current medications and made plans for refills. We also discussed your upcoming trips and how to manage your back pain during these travels.  YOUR PLAN:  -CORONARY ARTERY DISEASE: Your heart disease is stable and you are not experiencing any chest pain or shortness of breath. We will continue your current medications, Plavix and aspirin, to manage this condition.  -GASTROESOPHAGEAL REFLUX DISEASE: Your heartburn is well-controlled with pantoprazole. We will continue this medication.  -LOWER BACK PAIN: Your back pain worsens with walking and limits your mobility. We will refer you back to orthopedics for further evaluation and management. You may also consider over-the-counter Tylenol for pain management.  -NOCTURIA: You wake up several times a night to urinate, which may be related to your history of BPH. We will order some tests to evaluate your kidney function and prostate health.  -PERIPHERAL ARTERIAL DISEASE: You have mild symptoms of leg cramps, possibly related to hydration status. We will monitor these symptoms and no further intervention is needed at this time.  -HYPERCALCEMIA: Your recent labs showed mildly elevated calcium levels. We will repeat the electrolyte panel to evaluate your calcium level.  INSTRUCTIONS:  We will administer Tdap and Prevnar 20 vaccines today. We will also refill all your current medications with 90-day supplies and three refills each. We recommend enrolling in a chronic care management program for assistance with medication management and coordination of care. Please continue to monitor your symptoms and report any changes.

## 2022-11-12 LAB — URINALYSIS W MICROSCOPIC + REFLEX CULTURE
Bacteria, UA: NONE SEEN /HPF
Bilirubin Urine: NEGATIVE
Glucose, UA: NEGATIVE
Hgb urine dipstick: NEGATIVE
Hyaline Cast: NONE SEEN /LPF
Ketones, ur: NEGATIVE
Leukocyte Esterase: NEGATIVE
Nitrites, Initial: NEGATIVE
Protein, ur: NEGATIVE
RBC / HPF: NONE SEEN /HPF (ref 0–2)
Specific Gravity, Urine: 1.018 (ref 1.001–1.035)
Squamous Epithelial / HPF: NONE SEEN /HPF (ref <=5)
WBC, UA: NONE SEEN /HPF (ref 0–5)
pH: 5.5 (ref 5.0–8.0)

## 2022-11-12 LAB — NO CULTURE INDICATED

## 2022-11-17 ENCOUNTER — Encounter: Payer: Self-pay | Admitting: Physical Medicine and Rehabilitation

## 2022-11-17 ENCOUNTER — Other Ambulatory Visit: Payer: Self-pay

## 2022-11-17 ENCOUNTER — Telehealth: Payer: Self-pay | Admitting: Family Medicine

## 2022-11-17 ENCOUNTER — Ambulatory Visit: Payer: Medicare Other | Admitting: Physical Medicine and Rehabilitation

## 2022-11-17 VITALS — BP 130/72 | HR 61 | Ht 66.0 in | Wt 191.0 lb

## 2022-11-17 DIAGNOSIS — M25552 Pain in left hip: Secondary | ICD-10-CM | POA: Diagnosis not present

## 2022-11-17 DIAGNOSIS — M5416 Radiculopathy, lumbar region: Secondary | ICD-10-CM

## 2022-11-17 DIAGNOSIS — M25551 Pain in right hip: Secondary | ICD-10-CM

## 2022-11-17 DIAGNOSIS — M4316 Spondylolisthesis, lumbar region: Secondary | ICD-10-CM

## 2022-11-17 NOTE — Progress Notes (Signed)
Patient is here with complaint of low back and bilateral hip pain. He has seen someone for this previously, per chart, Dr. Madelon Lips in August of 2023 and Dr. Farris Has in March 2024.  He states that he was given prednisone which helped for a while. He is getting ready to go on two tours and will have to do a lot of walking. He is unable to walk any long distances without stopping to rest. He is taking ibuprofen for pain with relief.   Functional Pain Scale - descriptive words and definitions  Uncomfortable (3)  Pain is present but can complete all ADL's/sleep is slightly affected and passive distraction only gives marginal relief. Mild range order  Average Pain 3

## 2022-11-17 NOTE — Telephone Encounter (Signed)
Patient dropped off document Handicap Placard, to be filled out by provider. Patient requested to send it back via Call Patient to pick up within 5-days. Document is located in providers tray at front office.Please advise at Mobile [phone] (mobile)

## 2022-11-17 NOTE — Progress Notes (Unsigned)
Philip Allen - 82 y.o. male MRN [id number]  Date of birth: December 16, 1940  Office Visit Note: Visit Date: 11/17/2022 PCP: Philip Gunner, MD Referred by: Philip Gunner, MD  Subjective: Chief Complaint  Patient presents with   Lower Back - Pain   Right Hip - Pain   Left Hip - Pain   HPI: Philip Allen is a 82 y.o. male who comes in today per the request of Dr. Fanny Allen for evaluation of chronic, worsening and severe bilateral lower back pain radiating to hips. Patient is somewhat of poor historian. He was previously treated by [facility] with [facility], he assumed we were affiliated with that practice. Pain ongoing for over a year, worsens with standing and walking. He describes pain as sore and aching, currently rates as 7 out of 10. Some relief of pain with home exercise regimen, rest and use of medications. Stopped exercising at [facility] several months ago due to worsening pain. No history of lumbar surgery/injections. Patient has long trip to [place] coming up in the next several months. Patient denies focal weakness, numbness and tingling. No recent trauma or falls.      Review of Systems  Musculoskeletal:  Positive for back pain.  Neurological:  Negative for tingling, sensory change, focal weakness and weakness.  All other systems reviewed and are negative.  Otherwise per HPI.  Assessment & Plan: Visit Diagnoses:    ICD-10-CM   1. Lumbar radiculopathy  M54.16 XR Lumbar Spine 2-3 Views    MR LUMBAR SPINE WO CONTRAST    2. 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 Bilateral hip pain  M25.551    M25.552        Plan: Findings:  Chronic, worsening and severe bilateral lower back pain radiating to hips. Patient continues to have severe pain despite good conservative therapies such as home exercise regimen, rest and use of medications. Patients clinical presentation and exam are complex, differentials include lumbar radiculopathy vs  facet mediated pain. I am concerned about central stenosis as his severe pain seems to be caused by standing and walking. No pain noted with internal/external rotation of his hips today. I did obtain 2 view lumbar x-rays in the office today that show multi level spondylosis and anterolisthesis of L4-L5, Next step is to obtain lumbar MRI imaging. Depending on results of imaging we discussed possibility of performing lumbar epidural steroid injection. Timing for injection might be difficult as patient has plans to travel, however we will do our best to get him in quickly. No red flag symptoms noted upon exam today.     Meds & Orders: No orders of the defined types were placed in this encounter.   Orders Placed This Encounter  Procedures   XR Lumbar Spine 2-3 Views   MR LUMBAR SPINE WO CONTRAST    Follow-up: Return for lumbar MRI review.   Procedures: No procedures performed      Clinical History: No specialty comments available.   He reports that he has never smoked. He has never been exposed to tobacco smoke. He has never used smokeless tobacco. No results for input(s): "HGBA1C", "LABURIC" in the last 8760 hours.  Objective:  VS:  HT:5\' 6"  (167.6 cm)   WT:191 lb (86.6 kg)  BMI:30.84    BP:130/72  HR:61bpm  TEMP: ( )  RESP:  Physical Exam Vitals and nursing note reviewed.  HENT:     Head: Normocephalic and atraumatic.  Right Ear: External ear normal.     Left Ear: External ear normal.     Nose: Nose normal.     Mouth/Throat:     Mouth: Mucous membranes are moist.  Eyes:     Pupils: Pupils are equal, round, and reactive to light.  Cardiovascular:     Rate and Rhythm: Normal rate.     Pulses: Normal pulses.  Pulmonary:     Effort: Pulmonary effort is normal.  Abdominal:     General: Abdomen is flat. There is no distension.  Musculoskeletal:        General: Tenderness present.     Cervical back: Normal range of motion.     Comments: Patient is slow to rise from seated  position to standing. Good lumbar range of motion. No pain noted with facet loading. 5/5 strength noted with bilateral hip flexion, knee flexion/extension, ankle dorsiflexion/plantarflexion and EHL. No clonus noted bilaterally. No pain upon palpation of greater trochanters. No pain with internal/external rotation of bilateral hips. Sensation intact bilaterally. Negative slump test bilaterally. Ambulates without aid, gait steady.     Skin:    General: Skin is warm and dry.     Capillary Refill: Capillary refill takes less than 2 seconds.  Neurological:     General: No focal deficit present.     Mental Status: He is alert.  Psychiatric:        Mood and Affect: Mood normal.        Behavior: Behavior normal.     Ortho Exam  Imaging: XR Lumbar Spine 2-3 Views  Result Date: 11/17/2022 AP and lateral radiographs of lumbar spine exhibit multi level spondylosis, grade 1 anterolisthesis of L4 on L5. No fractures or dislocations.    Past Medical/Family/Surgical/Social History: Medications & Allergies reviewed per EMR, new medications updated. Patient Active Problem List   Diagnosis Date Noted   Peripheral arterial disease (HCC) 11/11/2022   Gastroesophageal reflux disease 11/11/2022   Leg cramp 11/11/2022   Benign prostatic hyperplasia with nocturia 11/11/2022   Chronic left-sided low back pain with sciatica 11/11/2022   Hypercalcemia 11/11/2022   Rash 05/18/2022   Hypertension 11/14/2021   Hyperlipidemia with target LDL less than 70 11/14/2021   Osteoarthritis 11/14/2021   CAD (coronary artery disease) 11/13/2021   Angina pectoris (HCC)    Weakness    Depression    Syncope 08/06/2019   Past Medical History:  Diagnosis Date   BPH (benign prostatic hypertrophy)    GERD (gastroesophageal reflux disease)    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   Hyperlipidemia    Hypertension    Urgency of urination    Family History  Problem Relation Age of Onset   Heart disease  Mother    Heart disease Father    Past Surgical History:  Procedure Laterality Date   CORONARY CTO INTERVENTION N/A 11/13/2021   Procedure: CORONARY CTO INTERVENTION;  Surgeon: Swaziland, Peter M, MD;  Location: [facility] INVASIVE CV LAB;  Service: Cardiovascular;  Laterality: N/A;   CORONARY STENT INTERVENTION N/A 10/29/2021   Procedure: CORONARY STENT INTERVENTION;  Surgeon: Swaziland, Peter M, MD;  Location: [facility] INVASIVE CV LAB;  Service: Cardiovascular;  Laterality: N/A;   CORONARY STENT INTERVENTION N/A 11/13/2021   Procedure: CORONARY STENT INTERVENTION;  Surgeon: Swaziland, Peter M, MD;  Location: [facility] INVASIVE CV LAB;  Service: Cardiovascular;  Laterality: N/A;   CORONARY ULTRASOUND/IVUS N/A 11/13/2021   Procedure: Intravascular Ultrasound/IVUS;  Surgeon: Swaziland, Peter M, MD;  Location:  [facility] INVASIVE CV LAB;  Service: Cardiovascular;  Laterality: N/A;   LEFT HEART CATH AND CORONARY ANGIOGRAPHY N/A 10/29/2021   Procedure: LEFT HEART CATH AND CORONARY ANGIOGRAPHY;  Surgeon: Swaziland, Peter M, MD;  Location: [facility] INVASIVE CV LAB;  Service: Cardiovascular;  Laterality: N/A;   RIGHT SHOULDER SURGERY  1998   TRANSURETHRAL RESECTION OF PROSTATE  02/05/2012   Procedure: TRANSURETHRAL RESECTION OF THE PROSTATE WITH GYRUS INSTRUMENTS;  Surgeon: Kathi Ludwig, MD;  Location: [facility];  Service: Urology;  Laterality: N/A;  WITH GYRUS SUPRAPUBIC TUBE OWER TO MAIN   Social History   Occupational History   Not on file  Tobacco Use   Smoking status: Never    Passive exposure: Never   Smokeless tobacco: Never  Vaping Use   Vaping status: Never Used  Substance and Sexual Activity   Alcohol use: Yes    Comment: OCCASIONAL    Drug use: No   Sexual activity: Yes

## 2022-11-18 NOTE — Telephone Encounter (Signed)
Advised pt that form is completed and ready for pick up M-F 8am-5pm. He verbalized understanding.

## 2022-11-19 ENCOUNTER — Telehealth: Payer: Self-pay

## 2022-11-19 NOTE — Progress Notes (Signed)
  Chronic Care Management   Note  11/19/2022 Name: ELDRA WORD MRN: [id number] DOB: [birth year]  Quintel Mccalla Abarca is a 82 y.o. year old male who is a primary care patient of Garnette Gunner, MD. I reached out to [employer] by phone today in response to a referral sent by Mr. Doris Cheadle Marcus's PCP.  Mr. Zane was given information about Chronic Care Management services today including:  CCM service includes personalized support from designated clinical staff supervised by the physician, including individualized plan of care and coordination with other care providers 24/7 contact phone numbers for assistance for urgent and routine care needs. Service will only be billed when office clinical staff spend 20 minutes or more in a month to coordinate care. Only one practitioner may furnish and bill the service in a calendar month. The patient may stop CCM services at amy time (effective at the end of the month) by phone call to the office staff. The patient will be responsible for cost sharing (co-pay) or up to 20% of the service fee (after annual deductible is met)  Mr. Advaith [facility]  declinedto scheduling an appointment with the CCM RN Case Manager   Follow up plan: Patient did not agree to scheduling an appointment with the RN Case Manager or Pharm d . The ordering provider has been notified.   Penne Lash, RMA Care Guide [facility]  [place], [place] [address] Direct Dial: [phone] [email or web address]

## 2022-11-26 DIAGNOSIS — L258 Unspecified contact dermatitis due to other agents: Secondary | ICD-10-CM | POA: Diagnosis not present

## 2022-11-29 ENCOUNTER — Ambulatory Visit (INDEPENDENT_AMBULATORY_CARE_PROVIDER_SITE_OTHER): Payer: Medicare Other

## 2022-11-29 DIAGNOSIS — M47816 Spondylosis without myelopathy or radiculopathy, lumbar region: Secondary | ICD-10-CM | POA: Diagnosis not present

## 2022-11-29 DIAGNOSIS — M5416 Radiculopathy, lumbar region: Secondary | ICD-10-CM | POA: Diagnosis not present

## 2022-11-29 DIAGNOSIS — M5137 Other intervertebral disc degeneration, lumbosacral region: Secondary | ICD-10-CM | POA: Diagnosis not present

## 2022-11-29 DIAGNOSIS — M5136 Other intervertebral disc degeneration, lumbar region: Secondary | ICD-10-CM | POA: Diagnosis not present

## 2022-11-29 DIAGNOSIS — M5135 Other intervertebral disc degeneration, thoracolumbar region: Secondary | ICD-10-CM | POA: Diagnosis not present

## 2022-12-10 ENCOUNTER — Other Ambulatory Visit: Payer: Self-pay | Admitting: Physical Medicine and Rehabilitation

## 2022-12-10 DIAGNOSIS — M5416 Radiculopathy, lumbar region: Secondary | ICD-10-CM

## 2022-12-10 NOTE — Progress Notes (Signed)
I called patient this morning to discuss recent lumbar MRI imaging, no answer, did leave VM for him to call back. I did place order for injection so we can get that process started.

## 2023-01-01 ENCOUNTER — Ambulatory Visit (INDEPENDENT_AMBULATORY_CARE_PROVIDER_SITE_OTHER): Payer: Medicare Other | Admitting: Physical Medicine and Rehabilitation

## 2023-01-01 ENCOUNTER — Other Ambulatory Visit: Payer: Self-pay

## 2023-01-01 DIAGNOSIS — M5416 Radiculopathy, lumbar region: Secondary | ICD-10-CM | POA: Diagnosis not present

## 2023-01-01 MED ORDER — METHYLPREDNISOLONE ACETATE 80 MG/ML IJ SUSP
80.0000 mg | Freq: Once | INTRAMUSCULAR | Status: AC
Start: 2023-01-01 — End: 2023-01-01
  Administered 2023-01-01: 80 mg

## 2023-01-01 NOTE — Patient Instructions (Signed)

## 2023-01-01 NOTE — Progress Notes (Signed)
Functional Pain Scale - descriptive words and definitions  Distressing (6)    Pain is present/unable to complete most ADLs limited by pain/sleep is difficult and active distraction is only marginal. Moderate range order  Average Pain 7  Tylenol helps with the pain he takes it about 2x a day. He did stop BT about 1 wk ago.   +Driver, -BT, -Dye Allergies.

## 2023-01-03 NOTE — Procedures (Signed)
Lumbar Epidural Steroid Injection - Interlaminar Approach with Fluoroscopic Guidance  Patient: Philip Allen      Date of Birth: Oct 31, 1940 MRN: [id number] PCP: Garnette Gunner, MD      Visit Date: 01/01/2023   Universal Protocol:     Consent Given By: the patient  Position: PRONE  Additional Comments: Vital signs were monitored before and after the procedure. Patient was prepped and draped in the usual sterile fashion. The correct patient, procedure, and site was verified.   Injection Procedure Details:   Procedure diagnoses: Lumbar radiculopathy [M54.16]   Meds Administered:  Meds ordered this encounter  Medications   methylPREDNISolone acetate (DEPO-MEDROL) injection 80 mg     Laterality: Left  Location/Site:  L5-S1  Needle: 3.5 in., 20 ga. Tuohy  Needle Placement: Paramedian epidural  Findings:   -Comments: Excellent flow of contrast into the epidural space.  Procedure Details: Using a paramedian approach from the side mentioned above, the region overlying the inferior lamina was localized under fluoroscopic visualization and the soft tissues overlying this structure were infiltrated with 4 ml. of 1% Lidocaine without Epinephrine. The Tuohy needle was inserted into the epidural space using a paramedian approach.   The epidural space was localized using loss of resistance along with counter oblique bi-planar fluoroscopic views.  After negative aspirate for air, blood, and CSF, a 2 ml. volume of Isovue-250 was injected into the epidural space and the flow of contrast was observed. Radiographs were obtained for documentation purposes.    The injectate was administered into the level noted above.   Additional Comments:  No complications occurred Dressing: 2 x 2 sterile gauze and Band-Aid    Post-procedure details: Patient was observed during the procedure. Post-procedure instructions were reviewed.  Patient left the clinic in stable condition.

## 2023-01-03 NOTE — Progress Notes (Signed)
Philip [facility] - 82 y.o. male MRN [id number]  Date of birth: [birth year]  Office Visit Note: Visit Date: 01/01/2023 PCP: Garnette Gunner, MD Referred by: Garnette Gunner, MD  Subjective: Chief Complaint  Patient presents with   Lower Back - Pain   HPI:  Philip Allen is a 82 y.o. male who comes in today at the request of Ellin Goodie, FNP for planned Left L5-S1 Lumbar Interlaminar epidural steroid injection with fluoroscopic guidance.  The patient has failed conservative care including home exercise, medications, time and activity modification.  This injection will be diagnostic and hopefully therapeutic.  Please see requesting physician notes for further details and justification.   ROS Otherwise per HPI.  Assessment & Plan: Visit Diagnoses:    ICD-10-CM   1. Lumbar radiculopathy  M54.16 XR C-ARM NO REPORT    Epidural Steroid injection    methylPREDNISolone acetate (DEPO-MEDROL) injection 80 mg      Plan: No additional findings.   Meds & Orders:  Meds ordered this encounter  Medications   methylPREDNISolone acetate (DEPO-MEDROL) injection 80 mg    Orders Placed This Encounter  Procedures   XR C-ARM NO REPORT   Epidural Steroid injection    Follow-up: Return for visit to requesting provider as needed.   Procedures: No procedures performed  Lumbar Epidural Steroid Injection - Interlaminar Approach with Fluoroscopic Guidance  Patient: Philip Allen      Date of Birth: 1941-01-26 MRN: [id number] PCP: Garnette Gunner, MD      Visit Date: 01/01/2023   Universal Protocol:     Consent Given By: the patient  Position: PRONE  Additional Comments: Vital signs were monitored before and after the procedure. Patient was prepped and draped in the usual sterile fashion. The correct patient, procedure, and site was verified.   Injection Procedure Details:   Procedure diagnoses: Lumbar radiculopathy [M54.16]   Meds Administered:  Meds ordered this  encounter  Medications   methylPREDNISolone acetate (DEPO-MEDROL) injection 80 mg     Laterality: Left  Location/Site:  L5-S1  Needle: 3.5 in., 20 ga. Tuohy  Needle Placement: Paramedian epidural  Findings:   -Comments: Excellent flow of contrast into the epidural space.  Procedure Details: Using a paramedian approach from the side mentioned above, the region overlying the inferior lamina was localized under fluoroscopic visualization and the soft tissues overlying this structure were infiltrated with 4 ml. of 1% Lidocaine without Epinephrine. The Tuohy needle was inserted into the epidural space using a paramedian approach.   The epidural space was localized using loss of resistance along with counter oblique bi-planar fluoroscopic views.  After negative aspirate for air, blood, and CSF, a 2 ml. volume of Isovue-250 was injected into the epidural space and the flow of contrast was observed. Radiographs were obtained for documentation purposes.    The injectate was administered into the level noted above.   Additional Comments:  No complications occurred Dressing: 2 x 2 sterile gauze and Band-Aid    Post-procedure details: Patient was observed during the procedure. Post-procedure instructions were reviewed.  Patient left the clinic in stable condition.   Clinical History: MRI LUMBAR SPINE WITHOUT CONTRAST   TECHNIQUE: Multiplanar, multisequence MR imaging of the lumbar spine was performed. No intravenous contrast was administered.   COMPARISON:  None Available.   FINDINGS: Segmentation:  Standard.   Alignment:  Minimal grade 1 anterolisthesis of L4 on L5.   Vertebrae: No acute fracture, evidence of discitis, or aggressive bone lesion.  Conus medullaris and cauda equina: Conus extends to the L2 level. Conus and cauda equina appear normal.   Paraspinal and other soft tissues: No acute paraspinal abnormality.   Disc levels:   Disc spaces: Degenerative disease  with disc height loss at L4-5 and to lesser extent L3-4. Disc desiccation throughout the lumbar spine.   T12-L1: Mild broad-based disc bulge. Mild bilateral facet arthropathy. No foraminal or central canal stenosis.   L1-L2: No significant disc bulge. Mild bilateral facet arthropathy. No foraminal or central canal stenosis.   L2-L3: Broad-based disc bulge. Moderate bilateral facet arthropathy. Mild spinal stenosis. Bilateral subarticular recess stenosis. Mild-moderate bilateral foraminal stenosis.   L3-L4: Broad-based disc bulge. Moderate bilateral facet arthropathy. Moderate spinal stenosis. Bilateral subarticular recess stenosis. Moderate bilateral foraminal stenosis.   L4-L5: Mild broad-based disc bulge. Moderate bilateral facet arthropathy. Mild spinal stenosis. Moderate right and mild left foraminal stenosis. Bilateral subarticular recess stenosis.   L5-S1: Minimal broad-based disc bulge. Moderate right and mild left facet arthropathy. Mild right foraminal stenosis. No left foraminal stenosis. No spinal stenosis.   IMPRESSION: 1. 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 2. No acute osseous injury of the lumbar spine.     Electronically Signed   By: Elige Ko M.D.   On: 12/10/2022 06:55     Objective:  VS:  HT:    WT:   BMI:     BP:   HR: bpm  TEMP: ( )  RESP:  Physical Exam Vitals and nursing note reviewed.  Constitutional:      General: He is not in acute distress.    Appearance: Normal appearance. He is not ill-appearing.  HENT:     Head: Normocephalic and atraumatic.     Right Ear: External ear normal.     Left Ear: External ear normal.     Nose: No congestion.  Eyes:     Extraocular Movements: Extraocular movements intact.  Cardiovascular:     Rate and Rhythm: Normal rate.     Pulses: Normal pulses.  Pulmonary:     Effort: Pulmonary effort is normal. No respiratory distress.  Abdominal:     General: There is no distension.      Palpations: Abdomen is soft.  Musculoskeletal:        General: No tenderness or signs of injury.     Cervical back: Neck supple.     Right lower leg: No edema.     Left lower leg: No edema.     Comments: Patient has good distal strength without clonus.  Skin:    Findings: No erythema or rash.  Neurological:     General: No focal deficit present.     Mental Status: He is alert and oriented to person, place, and time.     Sensory: No sensory deficit.     Motor: No weakness or abnormal muscle tone.     Coordination: Coordination normal.  Psychiatric:        Mood and Affect: Mood normal.        Behavior: Behavior normal.      Imaging: No results found.

## 2023-01-20 DIAGNOSIS — L258 Unspecified contact dermatitis due to other agents: Secondary | ICD-10-CM | POA: Diagnosis not present

## 2023-01-20 DIAGNOSIS — L570 Actinic keratosis: Secondary | ICD-10-CM | POA: Diagnosis not present

## 2023-01-20 DIAGNOSIS — X32XXXD Exposure to sunlight, subsequent encounter: Secondary | ICD-10-CM | POA: Diagnosis not present

## 2023-04-04 NOTE — Progress Notes (Unsigned)
Cardiology Office Note   Date:  04/04/2023   ID:  Philip Allen, DOB 1941/01/31, MRN [id number]  PCP:  Garnette Gunner, MD  Cardiologist:  Nona Dell MD   No chief complaint on file.     History of Present Illness: Philip Allen is a 82 y.o. male who is seen for follow up CAD.  He has been evaluated by Dr Diona Browner in the past for syncope. Evaluation in 2021 showed unremarkable Echo. Event monitor showed PACs and PVCs. Managed medically. He has a history of CVA, HTN, HLD and ED. He was seen in the emergency room on 10/05/2021 for complaints of chest heaviness located in the center of his chest with shortness of breath.  He felt weakness and was unable to stand so he laid down on the ground but never lost consciousness.  He reported that it lasted approximately 10 minutes.  His wife provided him 4 baby aspirin and symptoms resolved after 10 minutes however he was seen in the ED for further evaluation.  EKG did not show evidence of ACS, it revealed sinus rhythm with borderline intraventricular conduction delay similar to prior EKG in 2021.  He declined admission, and he was found to have 2 negative cardiac enzymes during observation in ED. He was seen by Joni Reining NP in the office on 10/08/21 and scheduled for coronary CTA. This showed a calcium score of 1435 and subtotal occlusion of the LAD.   He did undergo cardiac cath demonstrating occlusion of the LAD after the first diagonal. It fills by right to left collaterals from the RCA. We attempted to cross but were unable to cross with a prowater or [employer] 3 wire. He later underwent CTO PCI on 11/13/21 and we able to cross with a Confienza pro 12 wire and the LAD was successfully stented. We did stop metoprolol due to bradycardia.   He is  remarried and had a wonderful honeymoon in [place]. He is experiencing bilateral hip pain with exertion that goes away with rest. Was previously on Celebrex but now taking Doan's pills.  No chest pain or dyspnea. Planning to have eyelid surgery once able to hold Plavix.    Past Medical History:  Diagnosis Date   BPH (benign prostatic hypertrophy)    GERD (gastroesophageal reflux disease)    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   Hyperlipidemia    Hypertension    Urgency of urination     Past Surgical History:  Procedure Laterality Date   CORONARY CTO INTERVENTION N/A 11/13/2021   Procedure: CORONARY CTO INTERVENTION;  Surgeon: [place], Aliviana Burdell M, MD;  Location: [facility] INVASIVE CV LAB;  Service: Cardiovascular;  Laterality: N/A;   CORONARY STENT INTERVENTION N/A 10/29/2021   Procedure: CORONARY STENT INTERVENTION;  Surgeon: [place], Jakaylah Schlafer M, MD;  Location: [facility] INVASIVE CV LAB;  Service: Cardiovascular;  Laterality: N/A;   CORONARY STENT INTERVENTION N/A 11/13/2021   Procedure: CORONARY STENT INTERVENTION;  Surgeon: [place], Theordore Cisnero M, MD;  Location: [facility] INVASIVE CV LAB;  Service: Cardiovascular;  Laterality: N/A;   CORONARY ULTRASOUND/IVUS N/A 11/13/2021   Procedure: Intravascular Ultrasound/IVUS;  Surgeon: [place], Sedale Jenifer M, MD;  Location: [facility] INVASIVE CV LAB;  Service: Cardiovascular;  Laterality: N/A;   LEFT HEART CATH AND CORONARY ANGIOGRAPHY N/A 10/29/2021   Procedure: LEFT HEART CATH AND CORONARY ANGIOGRAPHY;  Surgeon: [place], Mahdi Frye M, MD;  Location: [facility] INVASIVE CV LAB;  Service: Cardiovascular;  Laterality: N/A;   RIGHT SHOULDER SURGERY  1998   TRANSURETHRAL RESECTION OF PROSTATE  02/05/2012   Procedure: TRANSURETHRAL RESECTION OF THE PROSTATE WITH GYRUS INSTRUMENTS;  Surgeon: Kathi Ludwig, MD;  Location: [facility];  Service: Urology;  Laterality: N/A;  WITH GYRUS SUPRAPUBIC TUBE OWER TO MAIN     Current Outpatient Medications  Medication Sig Dispense Refill   amLODipine (NORVASC) 10 MG tablet Take 1 tablet (10 mg total) by mouth daily. 90 tablet 3   aspirin EC 81 MG tablet Take 1 tablet (81 mg total) by mouth daily. Swallow whole. 90 tablet  3   atorvastatin (LIPITOR) 80 MG tablet Take 1 tablet (80 mg total) by mouth daily. 90 tablet 3   clopidogrel (PLAVIX) 75 MG tablet Take 1 tablet (75 mg total) by mouth daily. 90 tablet 3   Magnesium 400 MG TABS Take 400 mg by mouth daily.     nitroGLYCERIN (NITROSTAT) 0.4 MG SL tablet Place 1 tablet (0.4 mg total) under the tongue every 5 (five) minutes as needed for chest pain. 25 tablet 11   pantoprazole (PROTONIX) 40 MG tablet Take 1 tablet (40 mg total) by mouth daily before breakfast. 90 tablet 3   temazepam (RESTORIL) 30 MG capsule Take 30 mg by mouth at bedtime as needed for sleep.     triamcinolone cream (KENALOG) 0.1 % Apply 1 Application topically 2 (two) times daily. 30 g 0   No current facility-administered medications for this visit.    Allergies:   Naproxen    Social History:  The patient  reports that he has never smoked. He has never been exposed to tobacco smoke. He has never used smokeless tobacco. He reports current alcohol use. He reports that he does not use drugs.   Family History:  The patient's family history includes Heart disease in his father and mother.    ROS:  Please see the history of present illness.   Otherwise, review of systems are positive for none.   All other systems are reviewed and negative.    PHYSICAL EXAM: VS:  There were no vitals taken for this visit. , BMI There is no height or weight on file to calculate BMI. GEN: Well nourished, well developed, in no acute distress HEENT: normal Neck: no JVD, carotid bruits, or masses Cardiac: RRR; no murmurs, rubs, or gallops,no edema, right radial site with mild bruising no hematoma Respiratory:  clear to auscultation bilaterally, normal work of breathing GI: soft, nontender, nondistended, + BS MS: no deformity or atrophy Skin: warm and dry, no rash Neuro:  Strength and sensation are intact Psych: euthymic mood, full affect   EKG:  EKG is not ordered today.     Recent Labs: 09/09/2022: ALT  29; Magnesium 2.2 11/11/2022: BUN 18; Creatinine, Ser 1.07; Potassium 4.1; Sodium 139    Lipid Panel    Component Value Date/Time   CHOL 142 09/09/2022 0954   TRIG 92 09/09/2022 0954   HDL 57 09/09/2022 0954   CHOLHDL 2.5 09/09/2022 0954   LDLCALC 68 09/09/2022 0954      Wt Readings from Last 3 Encounters:  11/17/22 191 lb (86.6 kg)  11/11/22 191 lb 6.4 oz (86.8 kg)  09/09/22 192 lb 12.8 oz (87.5 kg)      Other studies Reviewed: Additional studies/ records that were reviewed today include:   Echocardiogram 08/06/2019:  1. Left ventricular ejection fraction, by estimation, is 50 to 55%. The  left ventricle has low normal function. The left ventricle has no regional  wall motion  abnormalities. There is moderate left ventricular hypertrophy.  Left ventricular diastolic  parameters are consistent with Grade I diastolic dysfunction (impaired  relaxation).   2. Right ventricular systolic function is normal. The right ventricular  size is normal.   3. The mitral valve is abnormal. Trivial mitral valve regurgitation.   4. The aortic valve is tricuspid. Aortic valve regurgitation is not  visualized. Mild aortic valve sclerosis is present, with no evidence of  aortic valve stenosis.    Cardiac monitor 09/20/2019: ZIO XT reviewed.  5 days 23 hours analyzed.  Predominant rhythm is sinus with heart rate ranging from 47 bpm up to 100 bpm and average heart rate 62 bpm.  There were occasional PACs representing 2.5% of total beats and occasional PVCs representing 3% of total beats.  Also relatively brief episodes of SVT, the longest of which lasted for 14 beats.  No sustained arrhythmias or pauses.  CLINICAL DATA:  Chest pain   EXAM: Cardiac/Coronary CTA   TECHNIQUE: A non-contrast, gated CT scan was obtained with axial slices of 3 mm through the heart for calcium scoring. Calcium scoring was performed using the Agatston method. A 120 kV prospective, gated, contrast cardiac scan was  obtained. Gantry rotation speed was 250 msecs and collimation was 0.6 mm. Two sublingual nitroglycerin tablets (0.8 mg) were given. The 3D data set was reconstructed in 5% intervals of the 35-75% of the R-R cycle. Diastolic phases were analyzed on a dedicated workstation using MPR, MIP, and VRT modes. The patient received 95 cc of contrast.   FINDINGS: Image quality: Excellent.   Noise artifact is: Limited.   Coronary Arteries:  Normal coronary origin.  Right dominance.   Left main: The left main is a large caliber vessel with a normal take off from the left coronary cusp that trifurcates into a LAD, LCX, and ramus intermedius. There is minimal calcified plaque (<25%).   Left anterior descending artery: The proximal LAD is subtotally occluded (100%) after the first diagonal. The first diagonal contains mild calcified plaque (<25%).   Ramus intermedius: Patent with no evidence of plaque or stenosis.   Left circumflex artery: The LCX is non-dominant. The proximal segment contains mild calcified plaque (25-49%). The LCX gives off 1 patent obtuse marginal branch.   Right coronary artery: The RCA is dominant with normal take off from the right coronary cusp. There is minimal calcified plaque (<25%) in the proximal and mid segments. The distal RCA contains mild calcified plaque (25-49%). The RCA terminates as a PDA and right posterolateral branch. The PDA is patent. The PLV contains minimal calcified plaque (<25%).   Right Atrium: Right atrial size is within normal limits.   Right Ventricle: The right ventricular cavity is within normal limits.   Left Atrium: Left atrial size is normal in size with no left atrial appendage filling defect.   Left Ventricle: The ventricular cavity size is within normal limits.   Pulmonary arteries: Normal in size without proximal filling defect.   Pulmonary veins: Normal pulmonary venous drainage.   Pericardium: Normal thickness without  significant effusion or calcium present.   Cardiac valves: The aortic valve is trileaflet without significant calcification. The mitral valve is normal without significant calcification.   Aorta: Normal caliber without significant disease.   Extra-cardiac findings: See attached radiology report for non-cardiac structures.   IMPRESSION: 1. Coronary calcium score of 1435. This was 75th percentile for age-, sex, and race-matched controls.   2. Normal coronary origin with right dominance.   3.  Subtotal occlusion of the proximal LAD (100%).   4. Mild CAD (25-49%) in the LCX/RCA.   RECOMMENDATIONS: 1. Total coronary occlusion (100%). Cardiac catheterization recommended.   Lennie Odor, MD     Electronically Signed   By: Lennie Odor M.D.   On: 10/25/2021 15:22  CORONARY STENT INTERVENTION  LEFT HEART CATH AND CORONARY ANGIOGRAPHY   Conclusion      Ost LM to Mid LM lesion is 20% stenosed.   Prox LAD to Mid LAD lesion is 100% stenosed.   Ost Cx to Prox Cx lesion is 50% stenosed.   RPAV lesion is 30% stenosed.   Mid RCA to Dist RCA lesion is 20% stenosed.   Post intervention, there is a 100% residual stenosis.   The left ventricular systolic function is normal.   LV end diastolic pressure is mildly elevated.   The left ventricular ejection fraction is 55-65% by visual estimate.   Single vessel occlusive CAD with CTO of the mid LAD immediately after the first diagonal. There are good right to left collaterals from the RCA. The LAD is a large vessel Normal LV function. Mildly elevated LVEDP 22 mm Hg Unsuccessful PCI of the LAD - unable to cross with lesion with a wire.   Plan: will DC home today. Continue medical therapy. Will consider bringing him back for dedicated CTO PCI.   Coronary Diagrams  Diagnostic Dominance: Right  Intervention   CTO intervention 11/13/21:  CORONARY STENT INTERVENTION  CORONARY CTO INTERVENTION  Intravascular Ultrasound/IVUS    Conclusion      Prox LAD to Mid LAD lesion is 100% stenosed.   A drug-eluting stent was successfully placed using a SYNERGY XD 2.75X28.   A drug-eluting stent was successfully placed using a SYNERGY XD 3.0X38.   Post intervention, there is a 0% residual stenosis.   Successful CTO PCI of the mid to distal LAD using IVUS guidance and overlapping DES x 2.   Plan: observe overnight. Anticipate DC in am. Plan DAPT for one year.   Coronary Diagrams  Diagnostic Dominance: Right  Intervention   Implants     Permanent Stent  Synergy Xd 2.75x28 - [id number] - Implanted  Inventory item: Phylliss Bob 8.29F62 Model/Cat number: [id number]  Manufacturer: Norvel Richards Lot number: [id number]  Device identifier: [id number] Device identifier type: GS1  GUDID Information  Request status Successful    Brand name: Lacie Scotts Version/Model: [id number]  Company name: [employer] MRI safety info as of 11/13/21: MR Conditional  Contains dry or latex rubber: No    GMDN P.T. name: Drug-eluting coronary artery stent, non-bioabsorbable-polymer-coated     As of 11/13/2021  Status: Implanted      Synergy Xd 3.0x38 - [id number] - Implanted  Inventory item: Phylliss Bob 3.0X38 Model/Cat number: [id number]  Manufacturer: Norvel Richards Lot number: [id number]  Device identifier: [id number] Device identifier type: GS1  GUDID Information  Request status Successful    Brand name: Lacie Scotts Version/Model: [id number]  Company name: [employer] MRI safety info as of 11/13/21: MR Conditional  Contains dry or latex rubber: No    GMDN P.T. name: Drug-eluting coronary artery stent, non-bioabsorbable-polymer-coated     As of 11/13/2021  Status: Implanted       Syngo Images   Show images for CARDIAC CATHETERIZATION Images on Long Term Storage   Show images for Menz, Levi Aland to Procedure Log  Procedure Log    Hemo Data  Flowsheet Row  Most Recent Value  AO Systolic Pressure  130 mmHg  AO Diastolic Pressure 60 mmHg  AO Mean 90 mmHg  LV Systolic Pressure -9 mmHg  LV Diastolic Pressure -10 mmHg  LV EDP 0 mmHg  AOp Systolic Pressure 136 mmHg  AOp Diastolic Pressure 59 mmHg  AOp Mean Pressure 88 mmHg  LVp Systolic Pressure 137 mmHg  LVp Diastolic Pressure -12 mmHg  LVp EDP Pressure 14 mmHg     ASSESSMENT AND PLAN:  1.  CAD with angina pectoris. Coronary CTA showed occlusion of the LAD. Otherwise nonobstructive CAD. LV function is normal.  Now s/p successful CTO PCI of the LAD in July. Continue DAPT for one year. I think we could hold plavis after 6 months for blepharoplasty.   2. HLD on statin. LDL at goal 62.   3. HTN. BP controlled. Continue amlodipine. Metoprolol stopped due to bradycardia.   4. Bilateral hip pain with exertion. Will check LE dopplers. May be arthritic and I would favor using Celebrex as needed rather than Doan's pills.    Current medicines are reviewed at length with the patient today.  The patient does not have concerns regarding medicines.  The following changes have been made:  no change  Labs/ tests ordered today include:   No orders of the defined types were placed in this encounter.        Disposition:   FU 6 months   Signed, Saron Tweed [place], MD  04/04/2023 7:08 PM    [facility] [address], [place], [place], [address] Phone [phone], Fax [phone]

## 2023-04-08 ENCOUNTER — Encounter: Payer: Self-pay | Admitting: Cardiology

## 2023-04-08 ENCOUNTER — Ambulatory Visit: Payer: Medicare Other | Attending: Cardiology | Admitting: Cardiology

## 2023-04-08 VITALS — BP 130/68 | HR 54 | Ht 66.0 in | Wt 193.0 lb

## 2023-04-08 DIAGNOSIS — I10 Essential (primary) hypertension: Secondary | ICD-10-CM | POA: Insufficient documentation

## 2023-04-08 DIAGNOSIS — I25118 Atherosclerotic heart disease of native coronary artery with other forms of angina pectoris: Secondary | ICD-10-CM | POA: Insufficient documentation

## 2023-04-08 DIAGNOSIS — E7800 Pure hypercholesterolemia, unspecified: Secondary | ICD-10-CM | POA: Insufficient documentation

## 2023-04-08 NOTE — Patient Instructions (Addendum)
Medication Instructions:  Stop Plavix Continue all other medications *If you need a refill on your cardiac medications before your next appointment, please call your pharmacy*   Lab Work: None ordered   Testing/Procedures: None ordered   Follow-Up: At [facility], you and your health needs are our priority.  As part of our continuing mission to provide you with exceptional heart care, we have created designated Provider Care Teams.  These Care Teams include your primary Cardiologist (physician) and Advanced Practice Providers (APPs -  Physician Assistants and Nurse Practitioners) who all work together to provide you with the care you need, when you need it.  We recommend signing up for the patient portal called "MyChart".  Sign up information is provided on this After Visit Summary.  MyChart is used to connect with patients for Virtual Visits (Telemedicine).  Patients are able to view lab/test results, encounter notes, upcoming appointments, etc.  Non-urgent messages can be sent to your provider as well.   To learn more about what you can do with MyChart, go to [email or web address].    Your next appointment:  6 months    Call in March to schedule June appointment     Provider:  Dr.Jordan

## 2023-05-04 ENCOUNTER — Telehealth: Payer: Self-pay | Admitting: Family Medicine

## 2023-05-04 NOTE — Telephone Encounter (Signed)
error 

## 2023-05-14 ENCOUNTER — Encounter: Payer: Self-pay | Admitting: Internal Medicine

## 2023-05-14 ENCOUNTER — Ambulatory Visit: Payer: Medicare Other | Admitting: Family Medicine

## 2023-05-14 ENCOUNTER — Ambulatory Visit (INDEPENDENT_AMBULATORY_CARE_PROVIDER_SITE_OTHER): Payer: Medicare Other | Admitting: Internal Medicine

## 2023-05-14 VITALS — BP 130/70 | HR 58 | Temp 98.4°F | Ht 66.0 in | Wt 195.0 lb

## 2023-05-14 DIAGNOSIS — I25118 Atherosclerotic heart disease of native coronary artery with other forms of angina pectoris: Secondary | ICD-10-CM | POA: Diagnosis not present

## 2023-05-14 DIAGNOSIS — Z7184 Encounter for health counseling related to travel: Secondary | ICD-10-CM | POA: Diagnosis not present

## 2023-05-14 DIAGNOSIS — K219 Gastro-esophageal reflux disease without esophagitis: Secondary | ICD-10-CM

## 2023-05-14 DIAGNOSIS — L309 Dermatitis, unspecified: Secondary | ICD-10-CM

## 2023-05-14 DIAGNOSIS — E785 Hyperlipidemia, unspecified: Secondary | ICD-10-CM | POA: Diagnosis not present

## 2023-05-14 DIAGNOSIS — I10 Essential (primary) hypertension: Secondary | ICD-10-CM | POA: Diagnosis not present

## 2023-05-14 DIAGNOSIS — L853 Xerosis cutis: Secondary | ICD-10-CM

## 2023-05-14 MED ORDER — TRIAMCINOLONE ACETONIDE 0.1 % EX CREA: 1.0000 | TOPICAL_CREAM | Freq: Two times a day (BID) | CUTANEOUS | 2 refills | Status: AC

## 2023-05-14 NOTE — Progress Notes (Signed)
 [facility]IMARY [facility]ARE [facility] [address] [place] [address] Dept: [phone] Dept Fax: [phone]    Subjective:   Philip Allen 1940-09-29 05/14/2023  Chief Complaint  Patient presents with   Follow-up    HPI: Philip Allen presents today for re-assessment and management of chronic medical conditions.  Discussed the use of AI scribe software for clinical note transcription with the patient, who gave verbal consent to proceed.  History of Present Illness   The patient, with a history of hypertension, hyperlipidemia, CAD with stent placement in 2023, and gastroesophageal reflux disease (GERD), presents for a routine follow-up. They report good control of their blood pressure at home and have recently seen their cardiologist. They were previously on amlodipine  and metoprolol , but the Metoprolol  was discontinued due to bradycardia.  They continue to take atorvastatin  for cholesterol management and pantoprazole  for reflux, both of which are well-tolerated and effective. Has Nitro PRN for angina , only used this 3 times since 2023. No recent uses. Denies CP, SHOB.   The patient also reports a new issue of itching on the ankles, lower legs, and lower back for the past six months. They have been using hydrocortisone cream, which provides temporary relief but does not completely resolve the issue. Additionally, they have been experiencing dry and sensitive palms, which have been cracking and causing discomfort. They have been using udder cream multiple times a day, which provides temporary relief but the issue persists.   Lastly, the patient reports he is going on an [place] in June 2025. He is inquiring about where to get vaccinations for upcoming travel.       The following portions of the patient's history were reviewed and updated as appropriate: past medical history, past surgical history, family history, social history,  allergies, medications, and problem list.   Patient Active Problem List   Diagnosis Date Noted   Peripheral arterial disease (HCC) 11/11/2022   Gastroesophageal reflux disease 11/11/2022   Leg cramp 11/11/2022   Benign prostatic hyperplasia with nocturia 11/11/2022   Chronic left-sided low back pain with sciatica 11/11/2022   Hypercalcemia 11/11/2022   Rash 05/18/2022   Hypertension 11/14/2021   Hyperlipidemia with target LDL less than 70 11/14/2021   Osteoarthritis 11/14/2021   CAD (coronary artery disease) 11/13/2021   Angina pectoris (HCC)    Weakness    Depression    Syncope 08/06/2019   Past Medical History:  Diagnosis Date   BPH (benign prostatic hypertrophy)    GERD (gastroesophageal reflux disease)    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   Hyperlipidemia    Hypertension    Urgency of urination    Past Surgical History:  Procedure Laterality Date   CORONARY CTO INTERVENTION N/A 11/13/2021   Procedure: CORONARY CTO INTERVENTION;  Surgeon: Jordan, Peter M, MD;  Location: [facility] INVASIVE CV LAB;  Service: Cardiovascular;  Laterality: N/A;   CORONARY STENT INTERVENTION N/A 10/29/2021   Procedure: CORONARY STENT INTERVENTION;  Surgeon: Jordan, Peter M, MD;  Location: [facility] INVASIVE CV LAB;  Service: Cardiovascular;  Laterality: N/A;   CORONARY STENT INTERVENTION N/A 11/13/2021   Procedure: CORONARY STENT INTERVENTION;  Surgeon: Jordan, Peter M, MD;  Location: [facility] INVASIVE CV LAB;  Service: Cardiovascular;  Laterality: N/A;   CORONARY ULTRASOUND/IVUS N/A 11/13/2021   Procedure: Intravascular Ultrasound/IVUS;  Surgeon: Jordan, Peter M, MD;  Location: [facility] INVASIVE CV LAB;  Service: Cardiovascular;  Laterality: N/A;   LEFT HEART CATH AND  CORONARY ANGIOGRAPHY N/A 10/29/2021   Procedure: LEFT HEART CATH AND CORONARY ANGIOGRAPHY;  Surgeon: Jordan, Peter M, MD;  Location: [facility] INVASIVE CV LAB;  Service: Cardiovascular;  Laterality: N/A;   RIGHT SHOULDER SURGERY  1998   TRANSURETHRAL  RESECTION OF PROSTATE  02/05/2012   Procedure: TRANSURETHRAL RESECTION OF THE PROSTATE WITH GYRUS INSTRUMENTS;  Surgeon: Arlena LILLETTE Gal, MD;  Location: [facility];  Service: Urology;  Laterality: N/A;  WITH GYRUS SUPRAPUBIC TUBE OWER TO MAIN   Family History  Problem Relation Age of Onset   Heart disease Mother    Heart disease Father     Current Outpatient Medications:    amLODipine  (NORVASC ) 10 MG tablet, Take 1 tablet (10 mg total) by mouth daily., Disp: 90 tablet, Rfl: 3   aspirin  EC 81 MG tablet, Take 1 tablet (81 mg total) by mouth daily. Swallow whole., Disp: 90 tablet, Rfl: 3   atorvastatin  (LIPITOR) 80 MG tablet, Take 1 tablet (80 mg total) by mouth daily., Disp: 90 tablet, Rfl: 3   Magnesium  400 MG TABS, Take 400 mg by mouth daily., Disp: , Rfl:    pantoprazole  (PROTONIX ) 40 MG tablet, Take 1 tablet (40 mg total) by mouth daily before breakfast., Disp: 90 tablet, Rfl: 3   triamcinolone  cream (KENALOG ) 0.1 %, Apply 1 Application topically 2 (two) times daily., Disp: 80 g, Rfl: 2   nitroGLYCERIN  (NITROSTAT ) 0.4 MG SL tablet, Place 1 tablet (0.4 mg total) under the tongue every 5 (five) minutes as needed for chest pain., Disp: 25 tablet, Rfl: 11 Allergies  Allergen Reactions   Naproxen Hives and Other (See Comments)    Reaction to Aleve     ROS: A complete ROS was performed with pertinent positives/negatives noted in the HPI. The remainder of the ROS are negative.    Objective:   Today's Vitals   05/14/23 1041  BP: 130/70  Pulse: (!) 58  Temp: 98.4 F (36.9 C)  TempSrc: Temporal  SpO2: 100%  Weight: 195 lb (88.5 kg)  Height: 5' 6 (1.676 m)    GENERAL: Well-appearing, in NAD. Well nourished.  SKIN: Pink, warm and dry. Localized Maculopapular erythematous rash to RLE NECK: Trachea midline. Full ROM w/o pain or tenderness. No lymphadenopathy.  RESPIRATORY: Chest wall symmetrical. Respirations even and non-labored. Breath sounds clear to  auscultation bilaterally.  CARDIAC: S1, S2 present, regular rate and rhythm. Peripheral pulses 2+ bilaterally.  EXTREMITIES: Without clubbing, cyanosis, or edema.  NEUROLOGIC: Steady, even gait.  PSYCH/MENTAL STATUS: Alert, oriented x 3. Cooperative, appropriate mood and affect.   Health Maintenance Due  Topic Date Due   DTaP/Tdap/Td (2 - Td or Tdap) 02/15/2007   INFLUENZA VACCINE  12/04/2022   Medicare Annual Wellness (AWV)  05/10/2023    No results found for any visits on 05/14/23.  The ASCVD Risk score (Arnett DK, et al., 2019) failed to calculate for the following reasons:   The 2019 ASCVD risk score is only valid for ages 13 to 49     Assessment & Plan:  Assessment and Plan    Hypertension Stable on Amlodipine . No recent changes in medication. -Continue current management.  Hyperlipidemia Stable on Atorvastatin . -Order fasting lipid panel. patient is not fasting today, he will return for lab work.  Gastroesophageal Reflux Disease Stable on Pantoprazole . -Continue current management.  CAD with Stable Angina -stable, continue to monitor  - continue with routine cardiology appointments  Eczema and Dry Skin Complaints of itching in the lower legs and back, and  dry, cracking skin on the palms. -Prescribe steroid cream for eczema on lower legs and back. -Recommend over-the-counter Aquaphor for dry skin on palms. -Advise use of moisturizing gloves at night after applying Aquaphor.  Travel Medicine Patient planning a trip to [place] in June. -Advise patient to review travel information and contact office with necessary vaccines or medications that are recommended by travel group. - Advised patient he can review CDC website for travel guidelines -Refer to [facility] Department for travel vaccines not available in office.  General Health Maintenance -Flu shot status unclear. Advise patient to check with spouse and get flu shot at pharmacy if not already  received. -Follow-up after patient returns from [place] trip.      Orders Placed This Encounter  Procedures   Comp Met (CMET)    Standing Status:   Future    Expiration Date:   05/13/2024   Lipid Profile    Standing Status:   Future    Expiration Date:   05/13/2024   No images are attached to the encounter or orders placed in the encounter. Meds ordered this encounter  Medications   triamcinolone  cream (KENALOG ) 0.1 %    Sig: Apply 1 Application topically 2 (two) times daily.    Dispense:  80 g    Refill:  2    Supervising Provider:   SEBASTIAN BEVERLEY NOVAK [[id number]]    Return in about 6 months (around 11/11/2023) for Chronic Condition follow up.   Rosina Senters, FNP

## 2023-05-14 NOTE — Patient Instructions (Addendum)
 Use Aquaphor to hands - Apply during daytime and at bedtime.  When using at bedtime, make sure to apply Aquaphor and then use moisturizing gloves at bedtime.     Use steroid cream for rash and itching on legs and back . Can apply Aquaphor on top of steroid cream after you rub it in.         Upcoming Travel: take a look at your travel documents and see what recommended vaccines are needed. Notify us  (PCP). You can also take a look on the CDC travel website to see what is recommended.   [email or web address]

## 2023-05-18 ENCOUNTER — Ambulatory Visit: Payer: Self-pay | Admitting: Family Medicine

## 2023-05-18 ENCOUNTER — Other Ambulatory Visit (INDEPENDENT_AMBULATORY_CARE_PROVIDER_SITE_OTHER): Payer: Medicare Other

## 2023-05-18 ENCOUNTER — Ambulatory Visit (INDEPENDENT_AMBULATORY_CARE_PROVIDER_SITE_OTHER): Payer: Medicare Other | Admitting: Family Medicine

## 2023-05-18 VITALS — BP 134/82 | HR 60 | Temp 97.4°F | Wt 191.2 lb

## 2023-05-18 DIAGNOSIS — N401 Enlarged prostate with lower urinary tract symptoms: Secondary | ICD-10-CM

## 2023-05-18 DIAGNOSIS — R351 Nocturia: Secondary | ICD-10-CM | POA: Diagnosis not present

## 2023-05-18 DIAGNOSIS — I10 Essential (primary) hypertension: Secondary | ICD-10-CM | POA: Diagnosis not present

## 2023-05-18 DIAGNOSIS — R350 Frequency of micturition: Secondary | ICD-10-CM | POA: Diagnosis not present

## 2023-05-18 DIAGNOSIS — E785 Hyperlipidemia, unspecified: Secondary | ICD-10-CM

## 2023-05-18 LAB — POC URINALSYSI DIPSTICK (AUTOMATED)
Bilirubin, UA: NEGATIVE
Blood, UA: POSITIVE
Glucose, UA: NEGATIVE
Ketones, UA: NEGATIVE
Leukocytes, UA: NEGATIVE
Nitrite, UA: NEGATIVE
Protein, UA: POSITIVE — AB
Spec Grav, UA: 1.015 (ref 1.010–1.025)
Urobilinogen, UA: 0.2 U/dL
pH, UA: 6 (ref 5.0–8.0)

## 2023-05-18 LAB — COMPREHENSIVE METABOLIC PANEL
ALT: 16 U/L (ref 0–53)
AST: 16 U/L (ref 0–37)
Albumin: 4 g/dL (ref 3.5–5.2)
Alkaline Phosphatase: 78 U/L (ref 39–117)
BUN: 19 mg/dL (ref 6–23)
CO2: 26 meq/L (ref 19–32)
Calcium: 8.8 mg/dL (ref 8.4–10.5)
Chloride: 101 meq/L (ref 96–112)
Creatinine, Ser: 1.08 mg/dL (ref 0.40–1.50)
GFR: 63.1 mL/min (ref >60.00)
Glucose, Bld: 122 mg/dL — ABNORMAL HIGH (ref 70–99)
Potassium: 3.6 meq/L (ref 3.5–5.1)
Sodium: 135 meq/L (ref 135–145)
Total Bilirubin: 1.2 mg/dL (ref 0.2–1.2)
Total Protein: 6.8 g/dL (ref 6.0–8.3)

## 2023-05-18 LAB — LIPID PANEL
Cholesterol: 118 mg/dL (ref 0–200)
HDL: 44.5 mg/dL (ref >39.00)
LDL Cholesterol: 53 mg/dL (ref 0–99)
NonHDL: 73.19
Total CHOL/HDL Ratio: 3
Triglycerides: 101 mg/dL (ref 0.0–149.0)
VLDL: 20.2 mg/dL (ref 0.0–40.0)

## 2023-05-18 MED ORDER — ALFUZOSIN HCL ER 10 MG PO TB24: 10.0000 mg | ORAL_TABLET | Freq: Every day | ORAL | 0 refills | Status: DC

## 2023-05-18 MED ORDER — SULFAMETHOXAZOLE-TRIMETHOPRIM 800-160 MG PO TABS: 1.0000 | ORAL_TABLET | Freq: Two times a day (BID) | ORAL | 0 refills | Status: AC

## 2023-05-18 NOTE — Telephone Encounter (Signed)
 Patient currently in office with PCP

## 2023-05-18 NOTE — Assessment & Plan Note (Signed)
 Patient presents with a 3-day history of dysuria and increased urinary frequency. No hematuria reported. Initially experienced nausea and decreased appetite, now improving. Urinalysis did not indicate infection, but symptoms improved with Azo.  Differential Diagnosis:  Urinary tract infection (UTI) - Dysuria and frequency suggest UTI; improvement with Azo supports this. Prostatitis - Possible inflammation of the prostate contributing to symptoms. Exacerbation of BPH - Enlarged prostate may cause urinary symptoms.  Plan: Obtain urine culture to confirm infection; Begin Bactrim  DS 1 tablet by mouth twice daily for 14 days.  Discontinue if urine culture is negative. Monitor symptoms; seek urgent medical care if high fever, severe nausea or vomiting, or worsening symptoms occur.

## 2023-05-18 NOTE — Patient Instructions (Signed)
-   Begin taking Bactrim  DS (double strength) antibiotic: Take one tablet by mouth twice daily for 14 days. If the urine culture is negative, we will notify you to stop the antibiotic. - Start taking alfuzosin  10 mg extended-release tablets: Take one tablet by mouth once daily to help improve urination. - A urine culture has been sent: We will contact you with the results when they are available. - You will be referred to a urologist: They will reach out to you to schedule an appointment for further evaluation. - Monitor your symptoms: If you experience high fever, severe nausea or vomiting, or worsening symptoms, seek urgent medical care.

## 2023-05-18 NOTE — Assessment & Plan Note (Signed)
 History of TURP in 2013 with current urinary symptoms and ejaculatory-like spasms, suggesting possible BPH exacerbation.  Plan: Start alfuzosin  10 mg extended-release tablet, 1 tablet by mouth once daily to improve urination. Refer to urology for further evaluation and management.

## 2023-05-18 NOTE — Telephone Encounter (Signed)
 Copied from CRM [id number]. Topic: Clinical - Red Word Triage >> May 18, 2023  8:45 AM Pinkey ORN wrote: Red Word that prompted transfer to Nurse Triage: Possible UTI   Chief Complaint:   Possible UTI Symptoms:  Urination  pain and increased Frequency Frequency: x 3 days Pertinent Negatives: Patient denies blood. Disposition: [] ED /[] Urgent Care (no appt availability in office) / [x] Appointment(In office/virtual)/ []  [facility]/ [] Home Care/ [] Refused Recommended Disposition /[]  Mobile Bus/ []  Follow-up with PCP Additional Notes:  Scheduled for today in office . Last UI many years ago Reason for Disposition  All other males with painful urination  Answer Assessment - Initial Assessment Questions 1. SEVERITY: How bad is the pain?  (e.g., Scale 1-10; mild, moderate, or severe)   - MILD (1-3): Complains slightly about urination hurting.   - MODERATE (4-7): Interferes with normal activities.     - SEVERE (8-10): Excruciating, unwilling or unable to urinate because of the pain.        Mild 1  More aggravation 2. FREQUENCY: How many times have you had painful urination today?       Every  hour urinating 3. PATTERN: Is pain present every time you urinate or just sometimes?       Yes 4. ONSET: When did the painful urination start?       Thre days ago   5. FEVER: Do you have a fever? If Yes, ask: What is your temperature, how was it measured, and when did it start?       Denies. 6. PAST UTI: Have you had a urine infection before? If Yes, ask: When was the last time? and What happened that time?       Many  years  ago   UTI 7. CAUSE: What do you think is causing the painful urination?       UTI  8. OTHER SYMPTOMS: Do you have any other symptoms? (e.g., flank pain, penis discharge, scrotal pain, blood in urine) Denis  Protocols used: Urination Pain - Male-A-AH

## 2023-05-18 NOTE — Progress Notes (Signed)
 Assessment/Plan:   Problem List Items Addressed This Visit       Other   Benign prostatic hyperplasia with nocturia   History of TURP in 2013 with current urinary symptoms and ejaculatory-like spasms, suggesting possible BPH exacerbation.  Plan: Start alfuzosin  10 mg extended-release tablet, 1 tablet by mouth once daily to improve urination. Refer to urology for further evaluation and management.      Relevant Medications   alfuzosin  (UROXATRAL ) 10 MG 24 hr tablet   Other Relevant Orders   Urine Culture   Ambulatory referral to Urology   Urinary frequency - Primary   Patient presents with a 3-day history of dysuria and increased urinary frequency. No hematuria reported. Initially experienced nausea and decreased appetite, now improving. Urinalysis did not indicate infection, but symptoms improved with Azo.  Differential Diagnosis:  Urinary tract infection (UTI) - Dysuria and frequency suggest UTI; improvement with Azo supports this. Prostatitis - Possible inflammation of the prostate contributing to symptoms. Exacerbation of BPH - Enlarged prostate may cause urinary symptoms.  Plan: Obtain urine culture to confirm infection; Begin Bactrim  DS 1 tablet by mouth twice daily for 14 days.  Discontinue if urine culture is negative. Monitor symptoms; seek urgent medical care if high fever, severe nausea or vomiting, or worsening symptoms occur.      Relevant Medications   sulfamethoxazole -trimethoprim  (BACTRIM  DS) 800-160 MG tablet   Other Relevant Orders   POCT Urinalysis Dipstick (Automated) (Completed)    There are no discontinued medications.  Return if symptoms worsen or fail to improve.    Subjective:   Encounter date: 05/18/2023  Philip Allen is a 83 y.o. male who has Syncope; Weakness; Depression; Angina pectoris (HCC); CAD (coronary artery disease); Hypertension; Hyperlipidemia with target LDL less than 70; Osteoarthritis; Rash; Peripheral arterial disease  (HCC); Gastroesophageal reflux disease; Leg cramp; Benign prostatic hyperplasia with nocturia; Chronic left-sided low back pain with sciatica; Hypercalcemia; and Urinary frequency on their problem list..   He  has a past medical history of BPH (benign prostatic hypertrophy), GERD (gastroesophageal reflux disease), History of hepatitis B, Hyperlipidemia, Hypertension, and Urgency of urination..   Chief Complaint: Urinary symptoms for 3 days.  History of Present Illness: Patient presents with a 3-day history of dysuria and increased urinary frequency. No hematuria. Initially experienced nausea and decreased appetite; now improving and able to eat (had coffee and sausage biscuit today). No fevers reported. Reports improvement of urinary symptoms with Azo (phenazopyridine). Has a history of bladder and prostate surgery (TURP) in 2013. Reports experiencing spasms in the prostate area resembling ejaculatory sensations, occurring more frequently over the last few days. Previous PSA checked in July was within normal limits. Has not followed up with urology since surgery.   Review of Systems:  Constitutional: Denies fevers, chills. Gastrointestinal: Initial nausea and decreased appetite; now improving. Denies abdominal pain, constipation, diarrhea. Genitourinary: Dysuria, increased frequency, no hematuria. Reports ejaculatory-like spasms. Respiratory: Denies chest pain, shortness of breath. Neurological: No neurological symptoms reported. Psychiatric: No mood changes or psychiatric symptoms. All other systems: Negative.     Past Surgical History:  Procedure Laterality Date   CORONARY CTO INTERVENTION N/A 11/13/2021   Procedure: CORONARY CTO INTERVENTION;  Surgeon: Jordan, Peter M, MD;  Location: [facility] INVASIVE CV LAB;  Service: Cardiovascular;  Laterality: N/A;   CORONARY STENT INTERVENTION N/A 10/29/2021   Procedure: CORONARY STENT INTERVENTION;  Surgeon: Jordan, Peter M, MD;  Location: [facility] INVASIVE CV  LAB;  Service: Cardiovascular;  Laterality: N/A;   CORONARY STENT  INTERVENTION N/A 11/13/2021   Procedure: CORONARY STENT INTERVENTION;  Surgeon: Jordan, Peter M, MD;  Location: [facility] INVASIVE CV LAB;  Service: Cardiovascular;  Laterality: N/A;   CORONARY ULTRASOUND/IVUS N/A 11/13/2021   Procedure: Intravascular Ultrasound/IVUS;  Surgeon: Jordan, Peter M, MD;  Location: [facility] INVASIVE CV LAB;  Service: Cardiovascular;  Laterality: N/A;   LEFT HEART CATH AND CORONARY ANGIOGRAPHY N/A 10/29/2021   Procedure: LEFT HEART CATH AND CORONARY ANGIOGRAPHY;  Surgeon: Jordan, Peter M, MD;  Location: [facility] INVASIVE CV LAB;  Service: Cardiovascular;  Laterality: N/A;   RIGHT SHOULDER SURGERY  1998   TRANSURETHRAL RESECTION OF PROSTATE  02/05/2012   Procedure: TRANSURETHRAL RESECTION OF THE PROSTATE WITH GYRUS INSTRUMENTS;  Surgeon: Arlena LILLETTE Gal, MD;  Location: [facility];  Service: Urology;  Laterality: N/A;  WITH GYRUS SUPRAPUBIC TUBE OWER TO MAIN    Outpatient Medications Prior to Visit  Medication Sig Dispense Refill   amLODipine  (NORVASC ) 10 MG tablet Take 1 tablet (10 mg total) by mouth daily. 90 tablet 3   aspirin  EC 81 MG tablet Take 1 tablet (81 mg total) by mouth daily. Swallow whole. 90 tablet 3   atorvastatin  (LIPITOR) 80 MG tablet Take 1 tablet (80 mg total) by mouth daily. 90 tablet 3   Magnesium  400 MG TABS Take 400 mg by mouth daily.     pantoprazole  (PROTONIX ) 40 MG tablet Take 1 tablet (40 mg total) by mouth daily before breakfast. 90 tablet 3   triamcinolone  cream (KENALOG ) 0.1 % Apply 1 Application topically 2 (two) times daily. 80 g 2   nitroGLYCERIN  (NITROSTAT ) 0.4 MG SL tablet Place 1 tablet (0.4 mg total) under the tongue every 5 (five) minutes as needed for chest pain. 25 tablet 11   No facility-administered medications prior to visit.    Family History  Problem Relation Age of Onset   Heart disease Mother    Heart disease Father     Social History    Socioeconomic History   Marital status: Married    Spouse name: Not on file   Number of children: Not on file   Years of education: Not on file   Highest education level: Not on file  Occupational History   Not on file  Tobacco Use   Smoking status: Never    Passive exposure: Never   Smokeless tobacco: Never  Vaping Use   Vaping status: Never Used  Substance and Sexual Activity   Alcohol use: Yes    Comment: OCCASIONAL    Drug use: No   Sexual activity: Yes  Other Topics Concern   Not on file  Social History Narrative   Not on file   Social Drivers of Health   Financial Resource Strain: Low Risk  (05/09/2022)   Overall Financial Resource Strain (CARDIA)    Difficulty of Paying Living Expenses: Not hard at all  Food Insecurity: No Food Insecurity (05/09/2022)   Hunger Vital Sign    Worried About Running Out of Food in the Last Year: Never true    Ran Out of Food in the Last Year: Never true  Transportation Needs: No Transportation Needs (05/09/2022)   PRAPARE - Administrator, Civil Service (Medical): No    Lack of Transportation (Non-Medical): No  Physical Activity: Sufficiently Active (05/09/2022)   Exercise Vital Sign    Days of Exercise per Week: 3 days    Minutes of Exercise per Session: 60 min  Stress: No Stress Concern Present (05/09/2022)   [employer]  [employer] - Occupational Stress Questionnaire    Feeling of Stress : Not at all  Social Connections: Not on file  Intimate Partner Violence: Not on file                                                                                                  Objective:  Physical Exam: BP 134/82   Pulse 60   Temp (!) 97.4 F (36.3 C) (Temporal)   Wt 191 lb 3.2 oz (86.7 kg)   SpO2 97%   BMI 30.86 kg/m   Wt Readings from Last 3 Encounters:  05/18/23 191 lb 3.2 oz (86.7 kg)  05/14/23 195 lb (88.5 kg)  04/08/23 193 lb (87.5 kg)     Physical Exam Constitutional:      Appearance:  Normal appearance.  HENT:     Head: Normocephalic and atraumatic.     Right Ear: Hearing normal.     Left Ear: Hearing normal.     Nose: Nose normal.  Eyes:     General: No scleral icterus.       Right eye: No discharge.        Left eye: No discharge.     Extraocular Movements: Extraocular movements intact.  Cardiovascular:     Rate and Rhythm: Normal rate and regular rhythm.     Heart sounds: Normal heart sounds.  Pulmonary:     Effort: Pulmonary effort is normal.     Breath sounds: Normal breath sounds.  Abdominal:     Palpations: Abdomen is soft.     Tenderness: There is no abdominal tenderness.  Skin:    General: Skin is warm.     Findings: No rash.  Neurological:     General: No focal deficit present.     Mental Status: He is alert.     Cranial Nerves: No cranial nerve deficit.  Psychiatric:        Mood and Affect: Mood normal.        Behavior: Behavior normal.        Thought Content: Thought content normal.        Judgment: Judgment normal.     No results found.  Recent Results (from the past 2160 hours)  POCT Urinalysis Dipstick (Automated)     Status: Abnormal   Collection Time: 05/18/23 10:30 AM  Result Value Ref Range   Color, UA yellow    Clarity, UA clear    Glucose, UA Negative Negative   Bilirubin, UA neg    Ketones, UA neg    Spec Grav, UA 1.015 1.010 - 1.025   Blood, UA positive     Comment: 1+   pH, UA 6.0 5.0 - 8.0   Protein, UA Positive (A) Negative    Comment: 1+   Urobilinogen, UA 0.2 0.2 or 1.0 E.U./dL   Nitrite, UA neg    Leukocytes, UA Negative Negative        Beverley Adine Hummer, MD, MS

## 2023-05-19 LAB — URINE CULTURE
MICRO NUMBER:: 15946897
Result:: NO GROWTH
SPECIMEN QUALITY:: ADEQUATE

## 2023-05-25 ENCOUNTER — Other Ambulatory Visit: Payer: Self-pay | Admitting: Internal Medicine

## 2023-05-25 DIAGNOSIS — R7301 Impaired fasting glucose: Secondary | ICD-10-CM

## 2023-05-26 ENCOUNTER — Other Ambulatory Visit (INDEPENDENT_AMBULATORY_CARE_PROVIDER_SITE_OTHER): Payer: Medicare Other

## 2023-05-26 DIAGNOSIS — R7301 Impaired fasting glucose: Secondary | ICD-10-CM

## 2023-05-26 LAB — HEMOGLOBIN A1C: Hgb A1c MFr Bld: 6.1 % (ref 4.6–6.5)

## 2023-05-27 ENCOUNTER — Encounter: Payer: Self-pay | Admitting: Internal Medicine

## 2023-05-27 DIAGNOSIS — R7303 Prediabetes: Secondary | ICD-10-CM | POA: Insufficient documentation

## 2023-06-04 ENCOUNTER — Telehealth: Payer: Self-pay

## 2023-06-04 DIAGNOSIS — M5416 Radiculopathy, lumbar region: Secondary | ICD-10-CM

## 2023-06-04 NOTE — Telephone Encounter (Signed)
Patient would like a repeat injection.  Patient stated injection helped at least 75% Pain is in the same area as before.  No new injurys.

## 2023-06-24 ENCOUNTER — Encounter: Payer: Medicare Other | Admitting: Physical Medicine and Rehabilitation

## 2023-06-29 ENCOUNTER — Other Ambulatory Visit: Payer: Self-pay

## 2023-06-29 ENCOUNTER — Ambulatory Visit (INDEPENDENT_AMBULATORY_CARE_PROVIDER_SITE_OTHER): Payer: Medicare Other | Admitting: Physical Medicine and Rehabilitation

## 2023-06-29 VITALS — BP 152/75 | HR 78

## 2023-06-29 DIAGNOSIS — M5416 Radiculopathy, lumbar region: Secondary | ICD-10-CM | POA: Diagnosis not present

## 2023-06-29 MED ORDER — METHYLPREDNISOLONE ACETATE 40 MG/ML IJ SUSP
40.0000 mg | Freq: Once | INTRAMUSCULAR | Status: AC
  Administered 2023-06-29: 40 mg

## 2023-06-29 NOTE — Progress Notes (Unsigned)
 Pain Score---6 No Allergies to Contrast Dye No Blood Thinners

## 2023-06-29 NOTE — Patient Instructions (Signed)

## 2023-07-01 NOTE — Procedures (Signed)
 Lumbar Epidural Steroid Injection - Interlaminar Approach with Fluoroscopic Guidance  Patient: Philip Allen      Date of Birth: Jan 27, 1941 MRN: [id number] PCP: Garnette Gunner, MD      Visit Date: 06/29/2023   Universal Protocol:     Consent Given By: the patient  Position: PRONE  Additional Comments: Vital signs were monitored before and after the procedure. Patient was prepped and draped in the usual sterile fashion. The correct patient, procedure, and site was verified.   Injection Procedure Details:   Procedure diagnoses: Lumbar radiculopathy [M54.16]   Meds Administered:  Meds ordered this encounter  Medications   methylPREDNISolone acetate (DEPO-MEDROL) injection 40 mg     Laterality: Left  Location/Site:  L5-S1  Needle: 3.5 in., 20 ga. Tuohy  Needle Placement: Paramedian epidural  Findings:   -Comments: Excellent flow of contrast into the epidural space.  Procedure Details: Using a paramedian approach from the side mentioned above, the region overlying the inferior lamina was localized under fluoroscopic visualization and the soft tissues overlying this structure were infiltrated with 4 ml. of 1% Lidocaine without Epinephrine. The Tuohy needle was inserted into the epidural space using a paramedian approach.   The epidural space was localized using loss of resistance along with counter oblique bi-planar fluoroscopic views.  After negative aspirate for air, blood, and CSF, a 2 ml. volume of Isovue-250 was injected into the epidural space and the flow of contrast was observed. Radiographs were obtained for documentation purposes.    The injectate was administered into the level noted above.   Additional Comments:  The patient tolerated the procedure well Dressing: 2 x 2 sterile gauze and Band-Aid    Post-procedure details: Patient was observed during the procedure. Post-procedure instructions were reviewed.  Patient left the clinic in stable  condition.

## 2023-07-01 NOTE — Progress Notes (Signed)
 Philip Allen - 83 y.o. male MRN [id number]  Date of birth: September 02, 1940  Office Visit Note: Visit Date: 06/29/2023 PCP: Garnette Gunner, MD Referred by: Garnette Gunner, MD  Subjective: Chief Complaint  Patient presents with   Lower Back - Pain   HPI:  Philip Allen is a 83 y.o. male who comes in today for planned repeat Left L5-S1  Lumbar Interlaminar epidural steroid injection with fluoroscopic guidance.  The patient has failed conservative care including home exercise, medications, time and activity modification.  This injection will be diagnostic and hopefully therapeutic.  Please see requesting physician notes for further details and justification. Patient received more than 50% pain relief from prior injection.   Referring: Ellin Goodie, FNP   ROS Otherwise per HPI.  Assessment & Plan: Visit Diagnoses:    ICD-10-CM   1. Lumbar radiculopathy  M54.16 XR C-ARM NO REPORT    Epidural Steroid injection    methylPREDNISolone acetate (DEPO-MEDROL) injection 40 mg      Plan: No additional findings.   Meds & Orders:  Meds ordered this encounter  Medications   methylPREDNISolone acetate (DEPO-MEDROL) injection 40 mg    Orders Placed This Encounter  Procedures   XR C-ARM NO REPORT   Epidural Steroid injection    Follow-up: Return if symptoms worsen or fail to improve.   Procedures: No procedures performed  Lumbar Epidural Steroid Injection - Interlaminar Approach with Fluoroscopic Guidance  Patient: Philip Allen      Date of Birth: [birth year] MRN: [id number] PCP: Garnette Gunner, MD      Visit Date: 06/29/2023   Universal Protocol:     Consent Given By: the patient  Position: PRONE  Additional Comments: Vital signs were monitored before and after the procedure. Patient was prepped and draped in the usual sterile fashion. The correct patient, procedure, and site was verified.   Injection Procedure Details:   Procedure diagnoses: Lumbar  radiculopathy [M54.16]   Meds Administered:  Meds ordered this encounter  Medications   methylPREDNISolone acetate (DEPO-MEDROL) injection 40 mg     Laterality: Left  Location/Site:  L5-S1  Needle: 3.5 in., 20 ga. Tuohy  Needle Placement: Paramedian epidural  Findings:   -Comments: Excellent flow of contrast into the epidural space.  Procedure Details: Using a paramedian approach from the side mentioned above, the region overlying the inferior lamina was localized under fluoroscopic visualization and the soft tissues overlying this structure were infiltrated with 4 ml. of 1% Lidocaine without Epinephrine. The Tuohy needle was inserted into the epidural space using a paramedian approach.   The epidural space was localized using loss of resistance along with counter oblique bi-planar fluoroscopic views.  After negative aspirate for air, blood, and CSF, a 2 ml. volume of Isovue-250 was injected into the epidural space and the flow of contrast was observed. Radiographs were obtained for documentation purposes.    The injectate was administered into the level noted above.   Additional Comments:  The patient tolerated the procedure well Dressing: 2 x 2 sterile gauze and Band-Aid    Post-procedure details: Patient was observed during the procedure. Post-procedure instructions were reviewed.  Patient left the clinic in stable condition.   Clinical History: MRI LUMBAR SPINE WITHOUT CONTRAST   TECHNIQUE: Multiplanar, multisequence MR imaging of the lumbar spine was performed. No intravenous contrast was administered.   COMPARISON:  None Available.   FINDINGS: Segmentation:  Standard.   Alignment:  Minimal grade 1 anterolisthesis of L4 on L5.  Vertebrae: No acute fracture, evidence of discitis, or aggressive bone lesion.   Conus medullaris and cauda equina: Conus extends to the L2 level. Conus and cauda equina appear normal.   Paraspinal and other soft tissues: No  acute paraspinal abnormality.   Disc levels:   Disc spaces: Degenerative disease with disc height loss at L4-5 and to lesser extent L3-4. Disc desiccation throughout the lumbar spine.   T12-L1: Mild broad-based disc bulge. Mild bilateral facet arthropathy. No foraminal or central canal stenosis.   L1-L2: No significant disc bulge. Mild bilateral facet arthropathy. No foraminal or central canal stenosis.   L2-L3: Broad-based disc bulge. Moderate bilateral facet arthropathy. Mild spinal stenosis. Bilateral subarticular recess stenosis. Mild-moderate bilateral foraminal stenosis.   L3-L4: Broad-based disc bulge. Moderate bilateral facet arthropathy. Moderate spinal stenosis. Bilateral subarticular recess stenosis. Moderate bilateral foraminal stenosis.   L4-L5: Mild broad-based disc bulge. Moderate bilateral facet arthropathy. Mild spinal stenosis. Moderate right and mild left foraminal stenosis. Bilateral subarticular recess stenosis.   L5-S1: Minimal broad-based disc bulge. Moderate right and mild left facet arthropathy. Mild right foraminal stenosis. No left foraminal stenosis. No spinal stenosis.   IMPRESSION: 1. 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 2. No acute osseous injury of the lumbar spine.     Electronically Signed   By: Elige Ko M.D.   On: 12/10/2022 06:55     Objective:  VS:  HT:    WT:   BMI:     BP:(!) 152/75  HR:78bpm  TEMP: ( )  RESP:  Physical Exam Vitals and nursing note reviewed.  Constitutional:      General: He is not in acute distress.    Appearance: Normal appearance. He is not ill-appearing.  HENT:     Head: Normocephalic and atraumatic.     Right Ear: External ear normal.     Left Ear: External ear normal.     Nose: No congestion.  Eyes:     Extraocular Movements: Extraocular movements intact.  Cardiovascular:     Rate and Rhythm: Normal rate.     Pulses: Normal pulses.  Pulmonary:     Effort: Pulmonary effort  is normal. No respiratory distress.  Abdominal:     General: There is no distension.     Palpations: Abdomen is soft.  Musculoskeletal:        General: No tenderness or signs of injury.     Cervical back: Neck supple.     Right lower leg: No edema.     Left lower leg: No edema.     Comments: Patient has good distal strength without clonus.  Skin:    Findings: No erythema or rash.  Neurological:     General: No focal deficit present.     Mental Status: He is alert and oriented to person, place, and time.     Sensory: No sensory deficit.     Motor: No weakness or abnormal muscle tone.     Coordination: Coordination normal.  Psychiatric:        Mood and Affect: Mood normal.        Behavior: Behavior normal.      Imaging: No results found.

## 2023-09-01 ENCOUNTER — Other Ambulatory Visit: Payer: Self-pay | Admitting: Family Medicine

## 2023-09-01 ENCOUNTER — Telehealth: Payer: Self-pay | Admitting: Family Medicine

## 2023-09-01 DIAGNOSIS — I10 Essential (primary) hypertension: Secondary | ICD-10-CM

## 2023-09-01 MED ORDER — AMLODIPINE BESYLATE 10 MG PO TABS: 10.0000 mg | ORAL_TABLET | Freq: Every day | ORAL | 3 refills | Status: AC

## 2023-09-01 NOTE — Telephone Encounter (Signed)
 Prescription Request  09/01/2023  LOV: 05/18/2023  What is the name of the medication or equipment? amLODipine  (NORVASC ) 10 MG tablet [[id number]]    Have you contacted your pharmacy to request a refill? No   Which pharmacy would you like this sent to?  [facility] - [place], [place] - [address] [place] [address] Phone: [phone] Fax: [phone]     Patient notified that their request is being sent to the clinical staff for review and that they should receive a response within 2 business days.   Please advise at Mobile [phone] (mobile)

## 2023-09-09 DIAGNOSIS — Z23 Encounter for immunization: Secondary | ICD-10-CM | POA: Diagnosis not present

## 2023-09-09 DIAGNOSIS — Z7184 Encounter for health counseling related to travel: Secondary | ICD-10-CM | POA: Diagnosis not present

## 2023-10-08 ENCOUNTER — Other Ambulatory Visit: Payer: Self-pay | Admitting: Family Medicine

## 2023-10-08 DIAGNOSIS — E785 Hyperlipidemia, unspecified: Secondary | ICD-10-CM

## 2023-10-14 ENCOUNTER — Ambulatory Visit: Admitting: Family Medicine

## 2023-10-19 ENCOUNTER — Ambulatory Visit: Payer: Medicare Other | Admitting: Family Medicine

## 2023-10-19 ENCOUNTER — Telehealth: Payer: Self-pay | Admitting: Physical Medicine and Rehabilitation

## 2023-10-19 NOTE — Telephone Encounter (Signed)
 Pt called requesting a injection ib back. Pease call pt at [phone].

## 2023-10-20 ENCOUNTER — Other Ambulatory Visit: Payer: Self-pay | Admitting: Physical Medicine and Rehabilitation

## 2023-10-20 DIAGNOSIS — M5416 Radiculopathy, lumbar region: Secondary | ICD-10-CM

## 2023-10-22 ENCOUNTER — Telehealth: Payer: Self-pay

## 2023-10-22 ENCOUNTER — Telehealth: Payer: Self-pay | Admitting: *Deleted

## 2023-10-22 NOTE — Telephone Encounter (Signed)
   Name: Philip Allen  DOB: 1940-06-24  MRN: [id number]  Primary Cardiologist: Peter Swaziland, MD   Preoperative team, please contact this patient and set up a phone call appointment for further preoperative risk assessment. Please obtain consent and complete medication review. Thank you for your help.  I confirm that guidance regarding antiplatelet and oral anticoagulation therapy has been completed and, if necessary, noted below.  His aspirin  may be held for 5 days prior to his procedure.  Please resume as soon as hemostasis is achieved.  I also confirmed the patient resides in the state of [place] . As per [facility] Medical Board telemedicine laws, the patient must reside in the state in which the provider is licensed.   Carie Charity, NP 10/22/2023, 2:29 PM [facility]

## 2023-10-22 NOTE — Telephone Encounter (Signed)
 Dr. Swaziland,  Mr. Slaven is requesting lumbar epidural injection.  Requesting office is asking for recommendations for holding antiplatelet therapy.  He underwent successful CTO intervention on 11/17/2021.  He had DES x 2 overlapping placed in his left anterior descending stents were noted to be 2.75 x 28 and 3.0 x 38.  On follow-up with you on 04/08/2023 he remained stable from a cardiac standpoint.  He was instructed to discontinue his Plavix  and continue aspirin .  His blood pressure was well-controlled.  His LDL was noted to be 68.  May his aspirin  be held prior to his procedure?   Thank you for your help.  Please direct your response to CV DIV preop pool.  Chet Cota. Annet Manukyan NP-C     10/22/2023, 11:34 AM [facility] [address] Office [phone] Fax [phone]

## 2023-10-22 NOTE — Telephone Encounter (Signed)
 Patient has been scheduled for tele preop appt med rec and consent done     Patient Consent for Virtual Visit         Philip Allen has provided verbal consent on 10/22/2023 for a virtual visit (video or telephone).   CONSENT FOR VIRTUAL VISIT FOR:  Philip Allen  By participating in this virtual visit I agree to the following:  I hereby voluntarily request, consent and authorize [facility] and its employed or contracted physicians, physician assistants, nurse practitioners or other licensed health care professionals (the Practitioner), to provide me with telemedicine health care services (the "Services) as deemed necessary by the treating Practitioner. I acknowledge and consent to receive the Services by the Practitioner via telemedicine. I understand that the telemedicine visit will involve communicating with the Practitioner through live audiovisual communication technology and the disclosure of certain medical information by electronic transmission. I acknowledge that I have been given the opportunity to request an in-person assessment or other available alternative prior to the telemedicine visit and am voluntarily participating in the telemedicine visit.  I understand that I have the right to withhold or withdraw my consent to the use of telemedicine in the course of my care at any time, without affecting my right to future care or treatment, and that the Practitioner or I may terminate the telemedicine visit at any time. I understand that I have the right to inspect all information obtained and/or recorded in the course of the telemedicine visit and may receive copies of available information for a reasonable fee.  I understand that some of the potential risks of receiving the Services via telemedicine include:  Delay or interruption in medical evaluation due to technological equipment failure or disruption; Information transmitted may not be sufficient (e.g. poor resolution  of images) to allow for appropriate medical decision making by the Practitioner; and/or  In rare instances, security protocols could fail, causing a breach of personal health information.  Furthermore, I acknowledge that it is my responsibility to provide information about my medical history, conditions and care that is complete and accurate to the best of my ability. I acknowledge that Practitioner's advice, recommendations, and/or decision may be based on factors not within their control, such as incomplete or inaccurate data provided by me or distortions of diagnostic images or specimens that may result from electronic transmissions. I understand that the practice of medicine is not an exact science and that Practitioner makes no warranties or guarantees regarding treatment outcomes. I acknowledge that a copy of this consent can be made available to me via my patient portal [facility] MyChart), or I can request a printed copy by calling the office of [facility].    I understand that my insurance will be billed for this visit.   I have read or had this consent read to me. I understand the contents of this consent, which adequately explains the benefits and risks of the Services being provided via telemedicine.  I have been provided ample opportunity to ask questions regarding this consent and the Services and have had my questions answered to my satisfaction. I give my informed consent for the services to be provided through the use of telemedicine in my medical care

## 2023-10-22 NOTE — Telephone Encounter (Signed)
   Pre-operative Risk Assessment    Patient Name: ARIAS WEINERT  DOB: [birth year] MRN: [id number]   Date of last office visit: 04/08/23 DR. Swaziland Date of next office visit: NONE   Request for Surgical Clearance    Procedure:  LUMBAR EPIDURAL INJECTION  Date of Surgery:  Clearance TBD (PER FORM URGENT)                               Surgeon:  NOT LISTED Surgeon's Group or Practice Name:  [facility]  Phone number:  [phone] Fax number:  [phone]   Type of Clearance Requested:   - Medical  - Pharmacy:  Hold Clopidogrel  (Plavix ) x 5 DAYS PRIOR   Type of Anesthesia:  Not Indicated +  Additional requests/questions:    Princeton Broom   10/22/2023, 11:17 AM

## 2023-10-22 NOTE — Telephone Encounter (Signed)
 Patient has been scheduled for tele preop appt

## 2023-10-27 ENCOUNTER — Ambulatory Visit: Admitting: Family Medicine

## 2023-10-29 ENCOUNTER — Ambulatory Visit: Attending: Cardiovascular Disease

## 2023-10-29 DIAGNOSIS — Z01810 Encounter for preprocedural cardiovascular examination: Secondary | ICD-10-CM

## 2023-10-29 NOTE — Progress Notes (Signed)
 Virtual Visit via Telephone Note   Because of Philip Allen co-morbid illnesses, he is at least at moderate risk for complications without adequate follow up.  This format is felt to be most appropriate for this patient at this time.  Due to technical limitations with video connection (technology), today's appointment will be conducted as an audio only telehealth visit, and Philip Allen verbally agreed to proceed in this manner.   All issues noted in this document were discussed and addressed.  No physical exam could be performed with this format.  Evaluation Performed:  Preoperative cardiovascular risk assessment _____________   Date:  10/29/2023   Patient ID:  Philip Allen, DOB 1941-01-19, MRN [id number] Patient Location:  Home Provider location:   Office  Primary Care Provider:  Sebastian Beverley NOVAK, MD Primary Cardiologist:  Peter Swaziland, MD  Chief Complaint / Patient Profile  83 y.o. 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 History of Present Illness  Philip Allen is a 83 y.o. male who presents via audio/video conferencing for a telehealth visit today.  Pt was last seen in cardiology clinic on 04/08/2023 by Dr. Swaziland.  At that time Philip Allen was doing well.  The patient is now pending procedure as outlined above. Since his last visit, he has remained stable from a cardiac standpoint.He is able to achieve greater than 4 METs of activity. Today he denies chest pain, shortness of breath, lower extremity edema, fatigue, palpitations, melena, hematuria, hemoptysis, diaphoresis, weakness, presyncope, syncope, orthopnea, and PND.  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   Hyperlipidemia    Hypertension    Urgency of urination    Past Surgical History:  Procedure Laterality Date   CORONARY CTO INTERVENTION N/A 11/13/2021   Procedure: CORONARY CTO INTERVENTION;  Surgeon: Swaziland, Peter M, MD;  Location: [facility] INVASIVE CV LAB;  Service: Cardiovascular;  Laterality: N/A;   CORONARY STENT INTERVENTION N/A 10/29/2021   Procedure: CORONARY STENT INTERVENTION;  Surgeon: Swaziland, Peter M, MD;  Location[facility] INVASIVE CV LAB;  Service: Cardiovascular;  Laterality: N/A;   CORONARY STENT INTERVENTION N/A 11/13/2021   Procedure: CORONARY STENT INTERVENTION;  Surgeon: Swaziland, Peter M, MD;  Location: [facility] INVASIVE CV LAB;  Service: Cardiovascular;  Laterality: N/A;   CORONARY ULTRASOUND/IVUS N/A 11/13/2021   Procedure: Intravascular Ultrasound/IVUS;  Surgeon: Swaziland, Peter M, MD;  Location: [facility] INVASIVE CV LAB;  Service: Cardiovascular;  Laterality: N/A;   LEFT HEART CATH AND CORONARY ANGIOGRAPHY N/A 10/29/2021   Procedure: LEFT HEART CATH AND CORONARY ANGIOGRAPHY;  Surgeon: Swaziland, Peter M, MD;  Location: [facility] INVASIVE CV LAB;  Service: Cardiovascular;  Laterality: N/A;   RIGHT SHOULDER SURGERY  1998   TRANSURETHRAL RESECTION OF PROSTATE  02/05/2012   Procedure: TRANSURETHRAL RESECTION OF THE PROSTATE WITH GYRUS INSTRUMENTS;  Surgeon: Arlena LILLETTE Gal, MD;  Location: [facility];  Service: Urology;  Laterality: N/A;  WITH GYRUS SUPRAPUBIC TUBE OWER TO MAIN    Allergies  Allergies  Allergen Reactions   Naproxen Hives and Other (See Comments)    Reaction to Aleve    Home Medications    Prior to Admission medications   Medication  Sig Start Date End Date Taking? Authorizing Provider  alfuzosin  (UROXATRAL ) 10 MG 24 hr tablet Take 1 tablet (10 mg total) by mouth daily with breakfast. 05/18/23   Sebastian Beverley NOVAK, MD  amLODipine  (NORVASC ) 10 MG tablet Take 1 tablet (10 mg total) by mouth daily. 09/01/23 08/26/24  Sebastian Beverley NOVAK, MD  aspirin   EC 81 MG tablet Take 1 tablet (81 mg total) by mouth daily. Swallow whole. 11/11/22 11/06/23  Sebastian Beverley NOVAK, MD  atorvastatin  (LIPITOR) 80 MG tablet TAKE 1 TABLET DAILY 10/08/23   Sebastian Beverley NOVAK, MD  Magnesium  400 MG TABS Take 400 mg by mouth daily.    [provider]  nitroGLYCERIN  (NITROSTAT ) 0.4 MG SL tablet Place 1 tablet (0.4 mg total) under the tongue every 5 (five) minutes as needed for chest pain. 09/09/22 12/08/22  Emelia Josefa HERO, NP  pantoprazole  (PROTONIX ) 40 MG tablet Take 1 tablet (40 mg total) by mouth daily before breakfast. 11/11/22 11/06/23  Sebastian Beverley NOVAK, MD  triamcinolone  cream (KENALOG ) 0.1 % Apply 1 Application topically 2 (two) times daily. 05/14/23   Billy Knee, FNP    Physical Exam  Vital Signs:  Lamar CROME Bucholz does not have vital signs available for review today. Given telephonic nature of communication, physical exam is limited. AAOx3. NAD. Normal affect.  Speech and respirations are unlabored. Accessory Clinical Findings  None Assessment & Plan    1.  Preoperative Cardiovascular Risk Assessment: Mr. Philip Allen perioperative risk of a major cardiac event is 6.6% according to the Revised Cardiac Risk Index (RCRI).   His functional capacity is good at 5.62 METs according to the Duke Activity Status Index (DASI). Recommendations: According to ACC/AHA guidelines, no further cardiovascular testing needed.  The patient may proceed to surgery at acceptable risk.   Antiplatelet and/or Anticoagulation Recommendations: Patient's Plavix  was discontinued at office visit on 04/08/2023 and he was continued on aspirin .  Per Dr. Swaziland, patient may hold aspirin  for 5 days prior to procedure, please resume as soon as hemostasis is achieved. Patient reports he has already starting holding of medication in anticipation of injection.   The patient was advised that if he develops new symptoms prior to surgery to contact our office to arrange for a follow-up visit, and he  verbalized understanding.  A copy of this note will be routed to requesting surgeon.  Time:   Today, I have spent 15 minutes with the patient with telehealth technology discussing medical history, symptoms, and management plan.    Harrietta Incorvaia D Laurie Penado, NP  10/29/2023, 10:03 AM

## 2023-10-30 ENCOUNTER — Telehealth: Payer: Self-pay | Admitting: Physical Medicine and Rehabilitation

## 2023-10-30 NOTE — Telephone Encounter (Signed)
 Patient's wife called. She would like a referral sent to imaging for an injection.

## 2023-11-02 NOTE — Discharge Instructions (Addendum)

## 2023-11-03 ENCOUNTER — Inpatient Hospital Stay
Admission: RE | Admit: 2023-11-03 | Discharge: 2023-11-03 | Disposition: A | Discharge status: Home / Self Care | Source: Ambulatory Visit | Attending: Physical Medicine and Rehabilitation | Admitting: Physical Medicine and Rehabilitation

## 2023-11-03 DIAGNOSIS — M4727 Other spondylosis with radiculopathy, lumbosacral region: Secondary | ICD-10-CM | POA: Diagnosis not present

## 2023-11-03 DIAGNOSIS — M5416 Radiculopathy, lumbar region: Secondary | ICD-10-CM

## 2023-11-03 MED ORDER — METHYLPREDNISOLONE ACETATE 40 MG/ML INJ SUSP (RADIOLOG
80.0000 mg | Freq: Once | INTRAMUSCULAR | Status: AC
  Administered 2023-11-03: 80 mg via EPIDURAL

## 2023-11-03 MED ORDER — IOPAMIDOL (ISOVUE-M 200) INJECTION 41%
1.0000 mL | Freq: Once | INTRAMUSCULAR | Status: AC
  Administered 2023-11-03: 1 mL via EPIDURAL

## 2023-11-24 ENCOUNTER — Ambulatory Visit (INDEPENDENT_AMBULATORY_CARE_PROVIDER_SITE_OTHER): Admitting: Family Medicine

## 2023-11-24 VITALS — BP 136/70 | HR 67 | Temp 98.4°F | Ht 66.0 in | Wt 187.2 lb

## 2023-11-24 DIAGNOSIS — R197 Diarrhea, unspecified: Secondary | ICD-10-CM | POA: Diagnosis not present

## 2023-11-24 DIAGNOSIS — G4483 Primary cough headache: Secondary | ICD-10-CM | POA: Diagnosis not present

## 2023-11-24 DIAGNOSIS — J069 Acute upper respiratory infection, unspecified: Secondary | ICD-10-CM

## 2023-11-24 LAB — POC COVID19 BINAXNOW: SARS Coronavirus 2 Ag: NEGATIVE

## 2023-11-24 MED ORDER — DM-GUAIFENESIN ER 30-600 MG PO TB12: 1.0000 | ORAL_TABLET | Freq: Two times a day (BID) | ORAL | Status: DC | PRN

## 2023-11-24 MED ORDER — PROMETHAZINE-DM 6.25-15 MG/5ML PO SYRP: 5.0000 mL | ORAL_SOLUTION | Freq: Four times a day (QID) | ORAL | 0 refills | Status: DC | PRN

## 2023-11-24 MED ORDER — FLUTICASONE PROPIONATE 50 MCG/ACT NA SUSP: 2.0000 | Freq: Every day | NASAL | 6 refills | Status: DC

## 2023-11-24 NOTE — Progress Notes (Signed)
 [ Assessment & Plan   Assessment/Plan:      Assessment & Plan Viral Respiratory Infection Presents with symptoms of a viral respiratory infection, including productive cough, runny nose, and diarrhea, which began approximately five days ago after a long flight from [place]. Symptoms have improved, with no fever, stable vital signs, and negative COVID-19 test. Previous wheezing has resolved. Eating and drinking again, indicating improvement. Differential diagnosis includes malaria, but absence of fever and presence of cough make a viral respiratory infection more likely. A chest x-ray was discussed but deemed unnecessary unless symptoms worsen due to absence of fever, wheezing, or crackles, and improvement in symptoms. - Encourage rest and increased fluid intake. - Recommend over-the-counter Mucinex  DM for cough management. - Suggest nasal spray for congestion relief. - Prescribe promethazine  DM as a backup for severe cough, with caution about drowsiness. - Advise use of acetaminophen  for myalgia. - Order chest x-ray at sister facility as a precaution, with the option to delay if symptoms continue to improve. - Instruct to monitor for worsening symptoms such as fever, persistent diarrhea, chest pain, or dyspnea, and return if these occur.  Diarrhea Diarrhea is improving and likely related to the viral respiratory infection. Able to maintain hydration, which is positive. Discussed use of over-the-counter loperamide (Imodium) if necessary, with caution about potential constipation. - Advise monitoring of diarrhea and ensure adequate fluid intake. - Recommend over-the-counter loperamide (Imodium) if necessary, with caution about potential constipation.  General Health Maintenance Recently traveled to [place] and received vaccinations for yellow fever and completed malaria prophylaxis. - Ensure completion of malaria prophylaxis as planned.      There are no discontinued  medications.  Return if symptoms worsen or fail to improve.        Subjective:   Encounter date: 11/24/2023  Philip Allen is a 83 y.o. male who has Syncope; Weakness; Depression; Angina pectoris (HCC); CAD (coronary artery disease); Hypertension; Hyperlipidemia with target LDL less than 70; Osteoarthritis; Rash; Peripheral arterial disease (HCC); Gastroesophageal reflux disease; Leg cramp; Benign prostatic hyperplasia with nocturia; Chronic left-sided low back pain with sciatica; Hypercalcemia; Urinary frequency; and Prediabetes on their problem list..   He  has a past medical history of BPH (benign prostatic hypertrophy), GERD (gastroesophageal reflux disease), History of hepatitis B, Hyperlipidemia, Hypertension, and Urgency of urination.SABRA   He presents with chief complaint of Cough .   Discussed the use of AI scribe software for clinical note transcription with the patient, who gave verbal consent to proceed.  History of Present Illness Philip Allen is an 83 year old male who presents with a cough and respiratory symptoms following recent travel to [place].  He developed a cough and respiratory symptoms approximately five days ago, coinciding with his return from an 18-day trip to [place]. Symptoms began with a runny nose and cough just before boarding a 15-hour flight back home. The cough is severe, originating deep in the chest, and is occasionally accompanied by wheezing, which has improved since yesterday.  No chest pain but muscle soreness from coughing. No significant shortness of breath beyond his baseline. He has experienced diarrhea, which is now slowing down, and has not had any vomiting. No fever and he has been able to resume eating, having consumed a small salad, soup, and eggs over the past two days.  Current medications include Tylenol  and over-the-counter cough medicine. He just finished his last dose of malaria prophylaxis today following his travel to  [place].     ROS  Past Surgical History:  Procedure Laterality Date   CORONARY CTO INTERVENTION N/A 11/13/2021   Procedure: CORONARY CTO INTERVENTION;  Surgeon: Swaziland, Peter M, MD;  Location: [facility] INVASIVE CV LAB;  Service: Cardiovascular;  Laterality: N/A;   CORONARY STENT INTERVENTION N/A 10/29/2021   Procedure: CORONARY STENT INTERVENTION;  Surgeon: Swaziland, Peter M, MD;  Location: [facility] INVASIVE CV LAB;  Service: Cardiovascular;  Laterality: N/A;   CORONARY STENT INTERVENTION N/A 11/13/2021   Procedure: CORONARY STENT INTERVENTION;  Surgeon: Swaziland, Peter M, MD;  Location: [facility] INVASIVE CV LAB;  Service: Cardiovascular;  Laterality: N/A;   CORONARY ULTRASOUND/IVUS N/A 11/13/2021   Procedure: Intravascular Ultrasound/IVUS;  Surgeon: Swaziland, Peter M, MD;  Location: [facility] INVASIVE CV LAB;  Service: Cardiovascular;  Laterality: N/A;   LEFT HEART CATH AND CORONARY ANGIOGRAPHY N/A 10/29/2021   Procedure: LEFT HEART CATH AND CORONARY ANGIOGRAPHY;  Surgeon: Swaziland, Peter M, MD;  Location: [facility] INVASIVE CV LAB;  Service: Cardiovascular;  Laterality: N/A;   RIGHT SHOULDER SURGERY  1998   TRANSURETHRAL RESECTION OF PROSTATE  02/05/2012   Procedure: TRANSURETHRAL RESECTION OF THE PROSTATE WITH GYRUS INSTRUMENTS;  Surgeon: Arlena LILLETTE Gal, MD;  Location: [facility];  Service: Urology;  Laterality: N/A;  WITH GYRUS SUPRAPUBIC TUBE OWER TO MAIN    Outpatient Medications Prior to Visit  Medication Sig Dispense Refill   alfuzosin  (UROXATRAL ) 10 MG 24 hr tablet Take 1 tablet (10 mg total) by mouth daily with breakfast. 30 tablet 0   amLODipine  (NORVASC ) 10 MG tablet Take 1 tablet (10 mg total) by mouth daily. 90 tablet 3   atorvastatin  (LIPITOR) 80 MG tablet TAKE 1 TABLET DAILY 90 tablet 3   Magnesium  400 MG TABS Take 400 mg by mouth daily.     triamcinolone  cream (KENALOG ) 0.1 % Apply 1 Application topically 2 (two) times daily. 80 g 2   nitroGLYCERIN  (NITROSTAT ) 0.4 MG SL tablet Place 1 tablet  (0.4 mg total) under the tongue every 5 (five) minutes as needed for chest pain. 25 tablet 11   pantoprazole  (PROTONIX ) 40 MG tablet Take 1 tablet (40 mg total) by mouth daily before breakfast. 90 tablet 3   No facility-administered medications prior to visit.    Family History  Problem Relation Age of Onset   Heart disease Mother    Heart disease Father     Social History   Socioeconomic History   Marital status: Married    Spouse name: Not on file   Number of children: Not on file   Years of education: Not on file   Highest education level: Not on file  Occupational History   Not on file  Tobacco Use   Smoking status: Never    Passive exposure: Never   Smokeless tobacco: Never  Vaping Use   Vaping status: Never Used  Substance and Sexual Activity   Alcohol use: Yes    Comment: OCCASIONAL    Drug use: No   Sexual activity: Yes  Other Topics Concern   Not on file  Social History Narrative   Not on file   Social Drivers of Health   Financial Resource Strain: Low Risk  (05/09/2022)   Overall Financial Resource Strain (CARDIA)    Difficulty of Paying Living Expenses: Not hard at all  Food Insecurity: No Food Insecurity (05/09/2022)   Hunger Vital Sign    Worried About Running Out of Food in the Last Year: Never true    Ran Out of Food in the Last Year: Never  true  Transportation Needs: No Transportation Needs (05/09/2022)   PRAPARE - Administrator, Civil Service (Medical): No    Lack of Transportation (Non-Medical): No  Physical Activity: Sufficiently Active (05/09/2022)   Exercise Vital Sign    Days of Exercise per Week: 3 days    Minutes of Exercise per Session: 60 min  Stress: No Stress Concern Present (05/09/2022)   [employer] of Occupational Health - Occupational Stress Questionnaire    Feeling of Stress : Not at all  Social Connections: Not on file  Intimate Partner Violence: Not on file                                                                                                   Objective:  Physical Exam: BP 136/70 (BP Location: Left Arm, Patient Position: Sitting, Cuff Size: Normal)   Pulse 67   Temp 98.4 F (36.9 C)   Ht 5' 6 (1.676 m)   Wt 187 lb 3.2 oz (84.9 kg)   SpO2 97%   BMI 30.21 kg/m   Wt Readings from Last 3 Encounters:  11/24/23 187 lb 3.2 oz (84.9 kg)  05/18/23 191 lb 3.2 oz (86.7 kg)  05/14/23 195 lb (88.5 kg)    Physical Exam GENERAL: Alert, cooperative, well developed, no acute distress HEENT: Normocephalic, normal oropharynx, moist mucous membranes CHEST: Clear to auscultation bilaterally, no wheezes, rhonchi, or crackles CARDIOVASCULAR: Normal heart rate and rhythm, S1 and S2 normal without murmurs ABDOMEN: Soft, non-tender, non-distended, without organomegaly, normal bowel sounds EXTREMITIES: No cyanosis or edema NEUROLOGICAL: Cranial nerves grossly intact, moves all extremities without gross motor or sensory deficit   Physical Exam  DG INJECT DIAG/THERA/INC NEEDLE/CATH/PLC EPI/LUMB/SAC W/IMG Result Date: 11/03/2023 CLINICAL DATA:  Lumbosacral spondylosis without myelopathy. Lumbar radiculopathy. Good response to a left L5-S1 interlaminar epidural injection performed elsewhere in February. Recurrent low back and bilateral hip pain. FLUOROSCOPY: Radiation Exposure Index (as provided by the fluoroscopic device): 3.80 mGy Kerma PROCEDURE: The procedure, risks, benefits, and alternatives were explained to the patient. Questions regarding the procedure were encouraged and answered. The patient understands and consents to the procedure. LUMBAR EPIDURAL INJECTION: An interlaminar approach was performed on the left at L5-S1. The overlying skin was cleansed and anesthetized. A 3.5 inch 20 gauge epidural needle was advanced using loss-of-resistance technique. DIAGNOSTIC EPIDURAL INJECTION Injection of Isovue -M 200 initially demonstrated spread into the posterior paraspinal soft tissues. The needle was advanced  further, and a subsequent contrast injection showed a good epidural pattern with spread above and below the level of needle placement, primarily on the left. No vascular opacification is seen. THERAPEUTIC EPIDURAL INJECTION: 80 mg of Depo-Medrol  mixed with 3 mL of 1% lidocaine  were instilled. The procedure was well-tolerated, and the patient was discharged thirty minutes following the injection in good condition. COMPLICATIONS: None immediate IMPRESSION: Technically successful interlaminar epidural injection on the left at L5-S1. Electronically Signed   By: Dasie Hamburg M.D.   On: 11/03/2023 15:11    No results found for this or any previous visit (from the past 2160 hours).  Beverley Adine Hummer, MD, MS

## 2023-11-24 NOTE — Patient Instructions (Addendum)
  VISIT SUMMARY: You came in today because of a cough and other respiratory symptoms that started after your recent trip to [place]. You also mentioned having some diarrhea, which is now getting better. You have been taking Tylenol  and over-the-counter cough medicine, and you just finished your malaria prophylaxis.  YOUR PLAN: -VIRAL RESPIRATORY INFECTION: You have a viral respiratory infection, which is an illness caused by a virus that affects your respiratory system. Your symptoms include a productive cough, runny nose, and diarrhea. You should rest and drink plenty of fluids. For your cough, you can take over-the-counter Mucinex  DM and use a nasal spray for congestion. If your cough becomes severe, you can use the prescribed promethazine  DM, but be aware it may cause drowsiness. Continue using acetaminophen  for muscle soreness. A chest x-ray has been ordered as a precaution, but you can delay it if your symptoms keep improving. Watch for any worsening symptoms like fever, persistent diarrhea, chest pain, or difficulty breathing, and come back if these occur.  -DIARRHEA: Your diarrhea is likely related to the viral respiratory infection and is improving. It's important to stay hydrated. If needed, you can take over-the-counter loperamide (Imodium) to help with the diarrhea, but be cautious as it can cause constipation.  -GENERAL HEALTH MAINTENANCE: You recently traveled to [place] and received vaccinations for yellow fever and completed your malaria prophylaxis. Make sure you have finished your malaria prophylaxis as planned.  INSTRUCTIONS: Monitor your symptoms and return if you experience fever, persistent diarrhea, chest pain, or difficulty breathing. A chest x-ray has been ordered at our sister facility, but you can delay it if your symptoms continue to improve.   Please be sure to drink plenty of fluids.   You may take the following OTC medications to help with symptoms:  For cough, use  cough  syrups or other cough suppressants.  For headache, sore throat, fevers, muscle aches, chills, other pain, take tylenol   For congestion, use nasal sprays, decongestants, or antihistamines  Please follow up if no improvement.   Go to ED if you have severe chest pain, fevers, shortness of breath or other worrisome symptoms.

## 2023-12-24 ENCOUNTER — Ambulatory Visit: Payer: Self-pay | Admitting: Family Medicine

## 2023-12-24 ENCOUNTER — Encounter: Payer: Self-pay | Admitting: Family Medicine

## 2023-12-24 ENCOUNTER — Ambulatory Visit: Admitting: Family Medicine

## 2023-12-24 VITALS — BP 124/62 | HR 71 | Temp 97.2°F | Resp 18 | Wt 187.4 lb

## 2023-12-24 DIAGNOSIS — R252 Cramp and spasm: Secondary | ICD-10-CM

## 2023-12-24 DIAGNOSIS — G4762 Sleep related leg cramps: Secondary | ICD-10-CM | POA: Diagnosis not present

## 2023-12-24 DIAGNOSIS — Z23 Encounter for immunization: Secondary | ICD-10-CM | POA: Diagnosis not present

## 2023-12-24 DIAGNOSIS — R350 Frequency of micturition: Secondary | ICD-10-CM | POA: Diagnosis not present

## 2023-12-24 DIAGNOSIS — R21 Rash and other nonspecific skin eruption: Secondary | ICD-10-CM | POA: Diagnosis not present

## 2023-12-24 LAB — COMPREHENSIVE METABOLIC PANEL WITH GFR
ALT: 34 U/L (ref 0–53)
AST: 25 U/L (ref 0–37)
Albumin: 4.5 g/dL (ref 3.5–5.2)
Alkaline Phosphatase: 88 U/L (ref 39–117)
BUN: 20 mg/dL (ref 6–23)
CO2: 24 meq/L (ref 19–32)
Calcium: 9.3 mg/dL (ref 8.4–10.5)
Chloride: 105 meq/L (ref 96–112)
Creatinine, Ser: 1.08 mg/dL (ref 0.40–1.50)
GFR: 62.83 mL/min (ref >60.00)
Glucose, Bld: 108 mg/dL — ABNORMAL HIGH (ref 70–99)
Potassium: 4 meq/L (ref 3.5–5.1)
Sodium: 138 meq/L (ref 135–145)
Total Bilirubin: 1.1 mg/dL (ref 0.2–1.2)
Total Protein: 7.1 g/dL (ref 6.0–8.3)

## 2023-12-24 LAB — URINALYSIS, ROUTINE W REFLEX MICROSCOPIC
Bilirubin Urine: NEGATIVE
Hgb urine dipstick: NEGATIVE
Leukocytes,Ua: NEGATIVE
Nitrite: NEGATIVE
RBC / HPF: NONE SEEN (ref 0–?)
Specific Gravity, Urine: 1.02 (ref 1.000–1.030)
Total Protein, Urine: NEGATIVE
Urine Glucose: NEGATIVE
Urobilinogen, UA: 0.2 (ref 0.0–1.0)
pH: 6 (ref 5.0–8.0)

## 2023-12-24 LAB — MAGNESIUM: Magnesium: 2 mg/dL (ref 1.5–2.5)

## 2023-12-24 MED ORDER — VITAMIN E 180 MG (400 UNIT) PO CAPS: 400.0000 [IU] | ORAL_CAPSULE | Freq: Two times a day (BID) | ORAL | 11 refills | Status: AC

## 2023-12-24 MED ORDER — VITAMIN B COMPLEX PO CAPS: 1.0000 | ORAL_CAPSULE | Freq: Every day | ORAL | 11 refills | Status: AC

## 2023-12-24 MED ORDER — CLOBETASOL PROPIONATE 0.05 % EX CREA: 1.0000 | TOPICAL_CREAM | Freq: Two times a day (BID) | CUTANEOUS | 0 refills | Status: DC

## 2023-12-24 MED ORDER — VITAMIN K2 100 MCG PO TABS: 1.0000 | ORAL_TABLET | Freq: Every day | ORAL | 11 refills | Status: AC

## 2023-12-24 NOTE — Patient Instructions (Signed)
  VISIT SUMMARY: Today, we discussed the issues you have been experiencing with cramping and a rash on your hands. We reviewed your symptoms and history, and I have provided a treatment plan to help manage these conditions.  YOUR PLAN: -PALMAR ECZEMA: Palmar eczema is a skin condition that causes dry, hardened, and cracked skin on the palms and fingers. It can be challenging to manage due to frequent hand use and exposure to irritants. I have prescribed clobetasol  ointment 0.05% to be applied twice daily to reduce inflammation. You should also use gentle moisturizers like Eucerin, CeraVe, or Cetaphil and avoid irritants such as soaps, alcohol, and fragrances. I am referring you to a dermatologist for further evaluation and management.  -NOCTURNAL LEG CRAMPS: Nocturnal leg cramps are painful muscle contractions that occur at night, often disrupting sleep. They can be caused by factors like electrolyte imbalances and dehydration. I recommend taking B complex vitamins twice daily, vitamin E  400 IU twice daily, and vitamin K2  180 mcg daily at night. It's important to stay hydrated and do stretching exercises before bed. I have also ordered baseline labs to check your electrolytes and urine. We will re-evaluate in one month to see how the vitamin regimen is working.  INSTRUCTIONS: Please follow up in one month to assess the effectiveness of the vitamin regimen for your leg cramps. Additionally, make sure to attend the dermatology referral for further evaluation of your palmar eczema.

## 2023-12-24 NOTE — Progress Notes (Signed)
 Assessment & Plan   Assessment/Plan:     Assessment & Plan Palmar eczema Chronic palmar eczema with dry, hardened, and cracked skin on the palms and extending to the webs of the fingers. Symptoms temporarily resolved during a recent trip to [place] but have recurred. Previous treatments with various ointments and moisturizers have been ineffective. The condition is challenging to manage due to frequent hand use and potential irritant exposure. Steroids can help but recurrence is common. Discussed that steroids can reduce inflammation but have side effects with prolonged use. Emphasized the importance of avoiding irritants and using gentle moisturizers. - Prescribe clobetasol  ointment 0.05% to be applied twice daily. - Refer to dermatology for further evaluation and management. - Advise to use gentle moisturizers such as Eucerin, CeraVe, or Cetaphil. - Avoid irritants such as soaps, alcohol, and fragrances.  Nocturnal leg cramps Long-standing nocturnal leg cramps, worsening over time, primarily affecting both legs from the knees to the groin. Occurs mainly at night, causing significant discomfort and sleep disturbance. Potential contributing factors include electrolyte imbalances and dehydration. No current medication offenders identified. Magnesium  supplementation has been tried. Vitamins B complex, E, and K2 are recommended based on evidence of efficacy with minimal side effects. Discussed that vitamins are preferred due to lower risk of side effects compared to medications. Emphasized the importance of hydration and stretching exercises. - Recommend B complex vitamins twice daily. - Recommend vitamin E  400 IU twice daily. - Recommend vitamin K2  180 mcg daily, preferably at night. - Order baseline labs to check electrolytes and urine. - Advise to maintain hydration and monitor water intake. - Encourage stretching exercises before bed. - Re-evaluate in one month to assess effectiveness of  vitamin regimen.      Medications Discontinued During This Encounter  Medication Reason   promethazine -dextromethorphan (PROMETHAZINE -DM) 6.25-15 MG/5ML syrup    dextromethorphan-guaiFENesin  (MUCINEX  DM) 30-600 MG 12hr tablet     Return if symptoms worsen or fail to improve.        Subjective:   Encounter date: 12/24/2023  Philip Allen is a 83 y.o. male who has Syncope; Weakness; Depression; Angina pectoris (HCC); CAD (coronary artery disease); Hypertension; Hyperlipidemia with target LDL less than 70; Osteoarthritis; Rash; Peripheral arterial disease (HCC); Gastroesophageal reflux disease; Leg cramp; Benign prostatic hyperplasia with nocturia; Chronic left-sided low back pain with sciatica; Hypercalcemia; Urinary frequency; and Prediabetes on their problem list..   He  has a past medical history of BPH (benign prostatic hypertrophy), GERD (gastroesophageal reflux disease), History of hepatitis B, Hyperlipidemia, Hypertension, and Urgency of urination.SABRA   He presents with chief complaint of muscle cramps  (Pt c/o of leg muscle cramps. Pt used OTC gels, sprays and muscle cramp pills for symptoms but didn't help much //HM due- TDAP vaccine ) and Rash (Pt c/o of right and left hand rash for 2 years become worse over time with burning sensation ) .   Discussed the use of AI scribe software for clinical note transcription with the patient, who gave verbal consent to proceed.  History of Present Illness Philip Allen is an 83 year old male who presents with cramping and a rash on his hands.  He has experienced a rash on his hands for a couple of years. The rash is characterized by dry, hardened skin that cracks, particularly on the palms and extending to the webs of the fingers. No bumps or blisters are present. Various topical applications, including vegetable oil, aloe vera, and hand lotions, have been used with temporary relief.  The rash temporarily resolved during a recent  two-week trip to [place] but returned shortly after coming back home.  He experiences cramping, which has been present all his life but is worsening. The cramps occur primarily at night and are severe, sometimes preventing him from getting out of bed. The worst cramps occur from the knees up to the groin in both legs, making it difficult to sleep. He takes magnesium  and drinks about five to six glasses of water daily, noting that lack of water intake exacerbates the cramps.     ROS  Past Surgical History:  Procedure Laterality Date   CORONARY CTO INTERVENTION N/A 11/13/2021   Procedure: CORONARY CTO INTERVENTION;  Surgeon: Swaziland, Peter M, MD;  Location: [facility] INVASIVE CV LAB;  Service: Cardiovascular;  Laterality: N/A;   CORONARY STENT INTERVENTION N/A 10/29/2021   Procedure: CORONARY STENT INTERVENTION;  Surgeon: Swaziland, Peter M, MD;  Location: [facility] INVASIVE CV LAB;  Service: Cardiovascular;  Laterality: N/A;   CORONARY STENT INTERVENTION N/A 11/13/2021   Procedure: CORONARY STENT INTERVENTION;  Surgeon: Swaziland, Peter M, MD;  Location: [facility] INVASIVE CV LAB;  Service: Cardiovascular;  Laterality: N/A;   CORONARY ULTRASOUND/IVUS N/A 11/13/2021   Procedure: Intravascular Ultrasound/IVUS;  Surgeon: Swaziland, Peter M, MD;  Location: [facility] INVASIVE CV LAB;  Service: Cardiovascular;  Laterality: N/A;   LEFT HEART CATH AND CORONARY ANGIOGRAPHY N/A 10/29/2021   Procedure: LEFT HEART CATH AND CORONARY ANGIOGRAPHY;  Surgeon: Swaziland, Peter M, MD;  Location: [facility] INVASIVE CV LAB;  Service: Cardiovascular;  Laterality: N/A;   RIGHT SHOULDER SURGERY  1998   TRANSURETHRAL RESECTION OF PROSTATE  02/05/2012   Procedure: TRANSURETHRAL RESECTION OF THE PROSTATE WITH GYRUS INSTRUMENTS;  Surgeon: Arlena LILLETTE Gal, MD;  Location: [facility];  Service: Urology;  Laterality: N/A;  WITH GYRUS SUPRAPUBIC TUBE OWER TO MAIN    Outpatient Medications Prior to Visit  Medication Sig Dispense Refill   alfuzosin   (UROXATRAL ) 10 MG 24 hr tablet Take 1 tablet (10 mg total) by mouth daily with breakfast. 30 tablet 0   amLODipine  (NORVASC ) 10 MG tablet Take 1 tablet (10 mg total) by mouth daily. 90 tablet 3   atorvastatin  (LIPITOR) 80 MG tablet TAKE 1 TABLET DAILY 90 tablet 3   fluticasone  (FLONASE ) 50 MCG/ACT nasal spray Place 2 sprays into both nostrils daily. 16 g 6   Magnesium  400 MG TABS Take 400 mg by mouth daily.     nitroGLYCERIN  (NITROSTAT ) 0.4 MG SL tablet Place 1 tablet (0.4 mg total) under the tongue every 5 (five) minutes as needed for chest pain. 25 tablet 11   pantoprazole  (PROTONIX ) 40 MG tablet Take 1 tablet (40 mg total) by mouth daily before breakfast. 90 tablet 3   triamcinolone  cream (KENALOG ) 0.1 % Apply 1 Application topically 2 (two) times daily. 80 g 2   dextromethorphan-guaiFENesin  (MUCINEX  DM) 30-600 MG 12hr tablet Take 1 tablet by mouth 2 (two) times daily as needed for cough.     promethazine -dextromethorphan (PROMETHAZINE -DM) 6.25-15 MG/5ML syrup Take 5 mLs by mouth 4 (four) times daily as needed. 118 mL 0   No facility-administered medications prior to visit.    Family History  Problem Relation Age of Onset   Heart disease Mother    Heart disease Father     Social History   Socioeconomic History   Marital status: Married    Spouse name: Not on file   Number of children: Not on file   Years of education: Not on file  Highest education level: Not on file  Occupational History   Not on file  Tobacco Use   Smoking status: Never    Passive exposure: Never   Smokeless tobacco: Never  Vaping Use   Vaping status: Never Used  Substance and Sexual Activity   Alcohol use: Yes    Comment: OCCASIONAL    Drug use: No   Sexual activity: Yes    Birth control/protection: None  Other Topics Concern   Not on file  Social History Narrative   Not on file   Social Drivers of Health   Financial Resource Strain: Low Risk  (05/09/2022)   Overall Financial Resource Strain  (CARDIA)    Difficulty of Paying Living Expenses: Not hard at all  Food Insecurity: No Food Insecurity (05/09/2022)   Hunger Vital Sign    Worried About Running Out of Food in the Last Year: Never true    Ran Out of Food in the Last Year: Never true  Transportation Needs: No Transportation Needs (05/09/2022)   PRAPARE - Administrator, Civil Service (Medical): No    Lack of Transportation (Non-Medical): No  Physical Activity: Sufficiently Active (05/09/2022)   Exercise Vital Sign    Days of Exercise per Week: 3 days    Minutes of Exercise per Session: 60 min  Stress: No Stress Concern Present (05/09/2022)   [employer] of Occupational Health - Occupational Stress Questionnaire    Feeling of Stress : Not at all  Social Connections: Not on file  Intimate Partner Violence: Not on file                                                                                                  Objective:  Physical Exam: BP 124/62 (BP Location: Right Arm, Patient Position: Sitting, Cuff Size: Large) Comment: recheck  Pulse 71   Temp (!) 97.2 F (36.2 C) (Temporal)   Resp 18   Wt 187 lb 6.4 oz (85 kg)   SpO2 98%   BMI 30.25 kg/m    Physical Exam GENERAL: Alert, cooperative, well developed, no acute distress. HEENT: Normocephalic, normal oropharynx, moist mucous membranes. CHEST: Clear to auscultation bilaterally, no wheezes, rhonchi, or crackles. CARDIOVASCULAR: Normal heart rate and rhythm, S1 and S2 normal without murmurs. ABDOMEN: Soft, non-tender, non-distended, without organomegaly, normal bowel sounds. EXTREMITIES: No cyanosis or edema. NEUROLOGICAL: Cranial nerves grossly intact, moves all extremities without gross motor or sensory deficit. Skin: Scaling and cracked palmar surface of hands and and webs of fingers   Physical Exam  DG INJECT DIAG/THERA/INC NEEDLE/CATH/PLC EPI/LUMB/SAC W/IMG Result Date: 11/03/2023 CLINICAL DATA:  Lumbosacral spondylosis without myelopathy.  Lumbar radiculopathy. Good response to a left L5-S1 interlaminar epidural injection performed elsewhere in February. Recurrent low back and bilateral hip pain. FLUOROSCOPY: Radiation Exposure Index (as provided by the fluoroscopic device): 3.80 mGy Kerma PROCEDURE: The procedure, risks, benefits, and alternatives were explained to the patient. Questions regarding the procedure were encouraged and answered. The patient understands and consents to the procedure. LUMBAR EPIDURAL INJECTION: An interlaminar approach was performed on the left at L5-S1. The overlying skin  was cleansed and anesthetized. A 3.5 inch 20 gauge epidural needle was advanced using loss-of-resistance technique. DIAGNOSTIC EPIDURAL INJECTION Injection of Isovue -M 200 initially demonstrated spread into the posterior paraspinal soft tissues. The needle was advanced further, and a subsequent contrast injection showed a good epidural pattern with spread above and below the level of needle placement, primarily on the left. No vascular opacification is seen. THERAPEUTIC EPIDURAL INJECTION: 80 mg of Depo-Medrol  mixed with 3 mL of 1% lidocaine  were instilled. The procedure was well-tolerated, and the patient was discharged thirty minutes following the injection in good condition. COMPLICATIONS: None immediate IMPRESSION: Technically successful interlaminar epidural injection on the left at L5-S1. Electronically Signed   By: Dasie Hamburg M.D.   On: 11/03/2023 15:11    Recent Results (from the past 2160 hours)  POC COVID-19     Status: Normal   Collection Time: 11/24/23  3:34 PM  Result Value Ref Range   SARS Coronavirus 2 Ag Negative Negative        Beverley Adine Hummer, MD, MS

## 2024-01-02 DIAGNOSIS — R079 Chest pain, unspecified: Secondary | ICD-10-CM | POA: Diagnosis not present

## 2024-01-02 DIAGNOSIS — R0789 Other chest pain: Secondary | ICD-10-CM | POA: Diagnosis not present

## 2024-01-05 DIAGNOSIS — R079 Chest pain, unspecified: Secondary | ICD-10-CM | POA: Diagnosis not present

## 2024-01-05 NOTE — Progress Notes (Unsigned)
 Cardiology Office Note   Date:  01/06/2024  ID:  Philip Allen, DOB August 23, 1940, MRN [id number] PCP: Sebastian Beverley NOVAK, MD   HeartCare Providers Cardiologist:  Peter Swaziland, MD   History of Present Illness Philip Allen is a 83 y.o. male who with a past medical history of hypertension, CKD, syncope, CVA, hyperlipidemia, cataracts who recently presented to the ED at [facility] for evaluation of chest pain.  He experienced chest pain an hour ago while in the ED lasting longer than usual.  Typically relieved by nitroglycerin  but this episode lasted several minutes for shortness of breath.  Was not experiencing chest tightness, nausea, vomiting, diaphoresis, fever, abdominal pain, cough, or leg swelling at the time of evaluation.  Last episode of chest pain was a couple of weeks prior.  Describes the pain as pressure-like, sudden onset, no specific triggers, not related to physical activity or stress.  Wife observed some grimacing.  No recent cardiac test.  Consulted primary care physician last week and some blood work was performed.   Stress test and echocardiogram was scheduled on Tuesday (01/05/2024).  Troponins negative x 2, repeat EKG was done.  Given patient's history admission was recommended but the patient flatly declined this.  Today, he is accompanied by his wife.  He experiences chest pain that began over the weekend, described as sharp and located in the middle of his chest, with a similar episode on the side of his chest this morning. The pain is quick, subsides after stopping activity, and is stronger than previous episodes. He uses nitroglycerin  for relief.  He has coronary artery disease and underwent cardiac catheterization in 2023 with stent placement in the left anterior descending artery. He describes having a 'whole metal jacket' on the LAD with collateral blood flow from right to left. Prior to stent placement, he had  difficulty walking and felt unwell, but has since improved and traveled extensively.  He experiences muscle cramps in his legs and hands, described as 'tight' and 'hard.' He takes magnesium  supplements and other vitamins. Electrolytes, including potassium, magnesium , and calcium , are within normal limits. He acknowledges occasional dehydration and drinks water with every meal.  He recalls an episode of syncope about five years ago, associated with significant weight loss and possibly low blood sugar. A heart monitor evaluation showed extra beats but no dangerous arrhythmias.  Current medications include amlodipine  10 mg, atorvastatin  80 mg, and various vitamins, all taken in the morning.   Reports no shortness of breath nor dyspnea on exertion. Reports no chest pain, pressure, or tightness. No edema, orthopnea, PND. Reports no palpitations.   Discussed the use of AI scribe software for clinical note transcription with the patient, who gave verbal consent to proceed.  ROS: Pertinent ROS in HPI  Studies Reviewed EKG Interpretation Date/Time:  Wednesday January 06 2024 10:38:48 EDT Ventricular Rate:  53 PR Interval:  210 QRS Duration:  102 QT Interval:  428 QTC Calculation: 401 R Axis:   31  Text Interpretation: Sinus bradycardia with 1st degree A-V block When compared with ECG of 14-Nov-2021 06:02, No significant change was found Confirmed by Lucien Blanc [phone]) on 01/06/2024 6:22:41 PM   Cardiac catheterization 11/13/21 Left Main  Ost LM to Mid LM lesion is 20% stenosed.    Left Anterior Descending  Collaterals  Dist LAD filled by collaterals from RPDA.    Prox LAD to Mid LAD lesion is 100% stenosed. The lesion is chronically occluded.  The lesion is calcified. Ultrasound (IVUS) was performed. Severe plaque burden was detected. IVUS has determined that the lesion is calcified.    Ramus Intermedius  Vessel is small. Vessel is angiographically normal.    Left Circumflex  Vessel is  small.  Ost Cx to Prox Cx lesion is 50% stenosed.    Right Coronary Artery  Mid RCA to Dist RCA lesion is 20% stenosed.    Right Posterior Atrioventricular Artery  RPAV lesion is 30% stenosed.    Intervention   Prox LAD to Mid LAD lesion  Stent  CATH MACH1 95F CLS4 guide catheter was inserted. Lesion crossed with guidewire using a WIRE ASAHI CONFIANZPRO12 300CM. Pre-stent angioplasty was performed using a BALLN EMERGE MR 2.0X15. A drug-eluting stent was successfully placed using a SYNERGY XD 2.75X28. Stent strut is well apposed. Post-stent angioplasty was performed using a BALLN [place] EMERGE MR 3.0X12. Maximum pressure: 16 atm.  Stent  A drug-eluting stent was successfully placed using a SYNERGY XD 3.0X38. Stent strut is well apposed. Stent overlaps previously placed stent. Post-stent angioplasty was performed using a BALLN [place] EMERGE MR 3.0X12. Maximum pressure: 18 atm.  Post-Intervention Lesion Assessment  The intervention was successful. Pre-interventional TIMI flow is 0. Post-intervention TIMI flow is 3. No complications occurred at this lesion.  There is a 0% residual stenosis post intervention.     Coronary Diagrams  Diagnostic Dominance: Right  Intervention     Physical Exam VS:  BP (!) 140/70 (BP Location: Left Arm, Patient Position: Sitting, Cuff Size: Normal)   Pulse 72   Resp 16   Ht 5' 6 (1.676 m)   Wt 188 lb 9.6 oz (85.5 kg)   SpO2 98%   BMI 30.44 kg/m        Wt Readings from Last 3 Encounters:  01/06/24 188 lb 9.6 oz (85.5 kg)  12/24/23 187 lb 6.4 oz (85 kg)  11/24/23 187 lb 3.2 oz (84.9 kg)    GEN: Well nourished, well developed in no acute distress NECK: No JVD; No carotid bruits CARDIAC: RRR, no murmurs, rubs, gallops RESPIRATORY:  Clear to auscultation without rales, wheezing or rhonchi  ABDOMEN: Soft, non-tender, non-distended EXTREMITIES:  No edema; No deformity   ASSESSMENT AND PLAN  Atherosclerotic heart disease of native coronary artery with  angina Intermittent chest pain, sometimes severe, relieved by nitroglycerin . Differential includes musculoskeletal or nerve-related pain. History of stenting in 2023 with collateral blood flow. No recent stress test or echocardiogram since 2021. - Order stress test and echocardiogram, schedule on the same day if possible. - Continue carrying nitroglycerin  for acute episodes.  Essential hypertension Blood pressure slightly elevated at 140 mmHg, improved compared to previous readings. - Continue amlodipine  10 mg.  Pure hypercholesterolemia Cholesterol management with atorvastatin  80 mg. Last lipid panel in January. Discussed taking atorvastatin  at night for better absorption. - Continue atorvastatin  80 mg. - Consider taking atorvastatin  at night for better absorption. - Schedule lipid panel at the end of the year or early next year.  Cardiac arrhythmias (atrial and ventricular premature depolarizations) Atrial and ventricular premature depolarizations noted on previous heart monitor.  Muscle cramps Frequent cramps in legs and hands. Electrolytes normal. Possible dehydration or other causes considered. - Discussed various remedies including vitamins and home remedies like mustard and pickle juice.  Impaired balance and gait Uses a cane for balance due to feeling wobbly and unsteady. No recent falls reported.    Dispo: He can follow-up in two months with me to review testing  Signed, Orren LOISE Fabry, PA-C

## 2024-01-06 ENCOUNTER — Ambulatory Visit: Attending: Cardiovascular Disease | Admitting: Physician Assistant

## 2024-01-06 ENCOUNTER — Encounter: Payer: Self-pay | Admitting: Physician Assistant

## 2024-01-06 VITALS — BP 140/70 | HR 72 | Resp 16 | Ht 66.0 in | Wt 188.6 lb

## 2024-01-06 DIAGNOSIS — I10 Essential (primary) hypertension: Secondary | ICD-10-CM | POA: Insufficient documentation

## 2024-01-06 DIAGNOSIS — E7800 Pure hypercholesterolemia, unspecified: Secondary | ICD-10-CM | POA: Diagnosis not present

## 2024-01-06 DIAGNOSIS — I491 Atrial premature depolarization: Secondary | ICD-10-CM | POA: Insufficient documentation

## 2024-01-06 DIAGNOSIS — I493 Ventricular premature depolarization: Secondary | ICD-10-CM | POA: Insufficient documentation

## 2024-01-06 DIAGNOSIS — I25118 Atherosclerotic heart disease of native coronary artery with other forms of angina pectoris: Secondary | ICD-10-CM | POA: Insufficient documentation

## 2024-01-06 DIAGNOSIS — R55 Syncope and collapse: Secondary | ICD-10-CM | POA: Diagnosis not present

## 2024-01-06 DIAGNOSIS — R079 Chest pain, unspecified: Secondary | ICD-10-CM | POA: Diagnosis not present

## 2024-01-06 NOTE — Patient Instructions (Signed)
 Medication Instructions:  Your physician recommends that you continue on your current medications as directed. Please refer to the Current Medication list given to you today.  *If you need a refill on your cardiac medications before your next appointment, please call your pharmacy*  Lab Work: TO BE DONE IN 2 MONTHS: LIPIDS If you have labs (blood work) drawn today and your tests are completely normal, you will receive your results only by: MyChart Message (if you have MyChart) OR A paper copy in the mail If you have any lab test that is abnormal or we need to change your treatment, we will call you to review the results.  Testing/Procedures: Your physician has requested that you have an echocardiogram. Echocardiography is a painless test that uses sound waves to create images of your heart. It provides your doctor with information about the size and shape of your heart and how well your heart's chambers and valves are working. This procedure takes approximately one hour. There are no restrictions for this procedure. Please do NOT wear cologne, perfume, aftershave, or lotions (deodorant is allowed). Please arrive 15 minutes prior to your appointment time.  Please note: We ask at that you not bring children with you during ultrasound (echo/ vascular) testing. Due to room size and safety concerns, children are not allowed in the ultrasound rooms during exams. Our front office staff cannot provide observation of children in our lobby area while testing is being conducted. An adult accompanying a patient to their appointment will only be allowed in the ultrasound room at the discretion of the ultrasound technician under special circumstances. We apologize for any inconvenience.   Your physician has requested that you have a lexiscan myoview. For further information please visit [email or web address] Please follow instruction sheet, as given.   Follow-Up: At [facility], you and your health  needs are our priority.  As part of our continuing mission to provide you with exceptional heart care, our providers are all part of one team.  This team includes your primary Cardiologist (physician) and Advanced Practice Providers or APPs (Physician Assistants and Nurse Practitioners) who all work together to provide you with the care you need, when you need it.  Your next appointment:   2 month(s)  Provider:   ORREN FABRY, PA-C  We recommend signing up for the patient portal called MyChart.  Sign up information is provided on this After Visit Summary.  MyChart is used to connect with patients for Virtual Visits (Telemedicine).  Patients are able to view lab/test results, encounter notes, upcoming appointments, etc.  Non-urgent messages can be sent to your provider as well.   To learn more about what you can do with MyChart, go to [email or web address].

## 2024-01-07 ENCOUNTER — Encounter (HOSPITAL_COMMUNITY): Payer: Self-pay | Admitting: *Deleted

## 2024-01-11 ENCOUNTER — Other Ambulatory Visit: Payer: Self-pay | Admitting: Physician Assistant

## 2024-01-11 DIAGNOSIS — R079 Chest pain, unspecified: Secondary | ICD-10-CM

## 2024-01-11 NOTE — Progress Notes (Signed)
 Attestation entered.  Orren LOISE Fabry, PA-C

## 2024-01-13 ENCOUNTER — Other Ambulatory Visit: Payer: Self-pay | Admitting: Family Medicine

## 2024-01-13 DIAGNOSIS — J069 Acute upper respiratory infection, unspecified: Secondary | ICD-10-CM

## 2024-01-14 ENCOUNTER — Ambulatory Visit (HOSPITAL_COMMUNITY)
Admission: RE | Admit: 2024-01-14 | Discharge: 2024-01-14 | Disposition: A | Discharge status: Home / Self Care | Source: Ambulatory Visit | Attending: Cardiology | Admitting: Cardiology

## 2024-01-14 DIAGNOSIS — R079 Chest pain, unspecified: Secondary | ICD-10-CM | POA: Insufficient documentation

## 2024-01-14 DIAGNOSIS — J069 Acute upper respiratory infection, unspecified: Secondary | ICD-10-CM | POA: Insufficient documentation

## 2024-01-14 LAB — MYOCARDIAL PERFUSION IMAGING
LV dias vol: 92 mL (ref 62–150)
LV sys vol: 48 mL (ref 4.2–5.8)
Nuc Stress EF: 53 %
Peak HR: 64 {beats}/min
Rest HR: 53 {beats}/min
Rest Nuclear Isotope Dose: 10.6 mCi
SDS: 0
SRS: 0
SSS: 0
ST Depression (mm): 0 mm
Stress Nuclear Isotope Dose: 32.5 mCi
TID: 1.06

## 2024-01-14 MED ORDER — TECHNETIUM TC 99M TETROFOSMIN IV KIT
10.6000 | PACK | Freq: Once | INTRAVENOUS | Status: AC | PRN
  Administered 2024-01-14: 10.6 via INTRAVENOUS

## 2024-01-14 MED ORDER — TECHNETIUM TC 99M TETROFOSMIN IV KIT
31.9000 | PACK | Freq: Once | INTRAVENOUS | Status: AC | PRN
Start: 2024-01-14 — End: 2024-01-14
  Administered 2024-01-14: 31.9 via INTRAVENOUS

## 2024-01-14 MED ORDER — REGADENOSON 0.4 MG/5ML IV SOLN
0.4000 mg | Freq: Once | INTRAVENOUS | Status: AC
  Administered 2024-01-14: 0.4 mg via INTRAVENOUS

## 2024-01-14 MED ORDER — REGADENOSON 0.4 MG/5ML IV SOLN
INTRAVENOUS | Status: AC
  Filled 2024-01-14: qty 5

## 2024-01-15 ENCOUNTER — Ambulatory Visit: Payer: Self-pay | Admitting: Physician Assistant

## 2024-01-19 DIAGNOSIS — X32XXXD Exposure to sunlight, subsequent encounter: Secondary | ICD-10-CM | POA: Diagnosis not present

## 2024-01-19 DIAGNOSIS — L821 Other seborrheic keratosis: Secondary | ICD-10-CM | POA: Diagnosis not present

## 2024-01-19 DIAGNOSIS — L570 Actinic keratosis: Secondary | ICD-10-CM | POA: Diagnosis not present

## 2024-02-05 ENCOUNTER — Ambulatory Visit (HOSPITAL_COMMUNITY): Admission: RE | Admit: 2024-02-05 | Source: Ambulatory Visit

## 2024-02-08 ENCOUNTER — Encounter (HOSPITAL_COMMUNITY): Payer: Self-pay | Admitting: Physician Assistant

## 2024-02-08 ENCOUNTER — Telehealth: Payer: Self-pay | Admitting: Cardiology

## 2024-02-08 NOTE — Telephone Encounter (Signed)
 Patient wants to have orders for echocardiogram reinstated and stated can call wife Mariellen) at [phone] to confirm.

## 2024-03-07 ENCOUNTER — Encounter: Payer: Self-pay | Admitting: Radiology

## 2024-03-17 ENCOUNTER — Ambulatory Visit (HOSPITAL_COMMUNITY)
Admission: RE | Admit: 2024-03-17 | Discharge: 2024-03-17 | Disposition: A | Discharge status: Home / Self Care | Source: Ambulatory Visit | Attending: Vascular Surgery | Admitting: Vascular Surgery

## 2024-03-17 DIAGNOSIS — R079 Chest pain, unspecified: Secondary | ICD-10-CM | POA: Diagnosis not present

## 2024-03-17 LAB — ECHOCARDIOGRAM COMPLETE
AR max vel: 1.94 cm2
AV Area VTI: 2.08 cm2
AV Area mean vel: 1.94 cm2
AV Mean grad: 3 mmHg
AV Peak grad: 5.2 mmHg
Ao pk vel: 1.14 m/s
Area-P 1/2: 3.37 cm2
S' Lateral: 3.01 cm

## 2024-03-18 ENCOUNTER — Encounter: Payer: Self-pay | Admitting: Physician Assistant

## 2024-03-18 ENCOUNTER — Ambulatory Visit: Attending: Physician Assistant | Admitting: Physician Assistant

## 2024-03-18 VITALS — BP 130/50 | HR 62 | Ht 66.0 in | Wt 194.0 lb

## 2024-03-18 DIAGNOSIS — I10 Essential (primary) hypertension: Secondary | ICD-10-CM | POA: Diagnosis not present

## 2024-03-18 DIAGNOSIS — I493 Ventricular premature depolarization: Secondary | ICD-10-CM | POA: Diagnosis present

## 2024-03-18 DIAGNOSIS — I25118 Atherosclerotic heart disease of native coronary artery with other forms of angina pectoris: Secondary | ICD-10-CM | POA: Insufficient documentation

## 2024-03-18 DIAGNOSIS — E7800 Pure hypercholesterolemia, unspecified: Secondary | ICD-10-CM | POA: Insufficient documentation

## 2024-03-18 DIAGNOSIS — R55 Syncope and collapse: Secondary | ICD-10-CM | POA: Insufficient documentation

## 2024-03-18 DIAGNOSIS — I491 Atrial premature depolarization: Secondary | ICD-10-CM | POA: Insufficient documentation

## 2024-03-18 DIAGNOSIS — R252 Cramp and spasm: Secondary | ICD-10-CM | POA: Insufficient documentation

## 2024-03-18 NOTE — Progress Notes (Signed)
 Cardiology Office Note   Date:  03/18/2024  ID:  Philip Allen, DOB 07/27/1940, MRN [id number] PCP: Sebastian Beverley NOVAK, MD  [facility] Providers Cardiologist:  Peter Jordan, MD   History of Present Illness Philip Allen is a 83 y.o. male who with a past medical history of hypertension, CKD, syncope, CVA, hyperlipidemia, cataracts who recently presented to the ED at [facility] for evaluation of chest pain.  He experienced chest pain an hour ago while in the ED lasting longer than usual.  Typically relieved by nitroglycerin  but this episode lasted several minutes for shortness of breath.  Was not experiencing chest tightness, nausea, vomiting, diaphoresis, fever, abdominal pain, cough, or leg swelling at the time of evaluation.  Last episode of chest pain was a couple of weeks prior.  Describes the pain as pressure-like, sudden onset, no specific triggers, not related to physical activity or stress.  Wife observed some grimacing.  No recent cardiac test.  Consulted primary care physician last week and some blood work was performed.   Stress test and echocardiogram was scheduled on Tuesday (01/05/2024).  Troponins negative x 2, repeat EKG was done.  Given patient's history admission was recommended but the patient flatly declined this.  I saw him 01/06/2024, he is accompanied by his wife.  He experiences chest pain that began over the weekend, described as sharp and located in the middle of his chest, with a similar episode on the side of his chest this morning. The pain is quick, subsides after stopping activity, and is stronger than previous episodes. He uses nitroglycerin  for relief.  He has coronary artery disease and underwent cardiac catheterization in 2023 with stent placement in the left anterior descending artery. He describes having a 'whole metal jacket' on the LAD with collateral blood flow from right to left. Prior to stent  placement, he had difficulty walking and felt unwell, but has since improved and traveled extensively.  He experiences muscle cramps in his legs and hands, described as 'tight' and 'hard.' He takes magnesium  supplements and other vitamins. Electrolytes, including potassium, magnesium , and calcium , are within normal limits. He acknowledges occasional dehydration and drinks water with every meal.  He recalls an episode of syncope about five years ago, associated with significant weight loss and possibly low blood sugar. A heart monitor evaluation showed extra beats but no dangerous arrhythmias.  Current medications include amlodipine  10 mg, atorvastatin  80 mg, and various vitamins, all taken in the morning.   Reports no shortness of breath nor dyspnea on exertion. Reports no chest pain, pressure, or tightness. No edema, orthopnea, PND. Reports no palpitations.   Discussed the use of AI scribe software for clinical note transcription with the patient, who gave verbal consent to proceed.  Today, he presents with hx of coronary artery disease and aortic aneurysm who presents for follow-up of his cardiac conditions.  He has coronary artery disease with a prior stress test indicating a small area of myocardial infarction. He has undergone stent placement in the LAD and experiences occasional angina, recently having two episodes requiring nitroglycerin  for relief.  He has an aortic aneurysm with an aortic root measuring 41 mm. He experiences dizziness and lightheadedness, particularly when standing up quickly, more common in the morning and associated with dehydration.   He had to use his nitroglycerin  2-3 weeks ago and took 2 of them. He has had a total of 2 times he needed nitroglycerin  for chest pain/cramping.  He has a long standing hx of cramps in his legs and other areas of his body for which he takes vitamin K, magnesium , and vitamin E .   Reports no shortness of breath nor dyspnea on exertion. No  edema, orthopnea, PND. Reports no palpitations.    Discussed the use of AI scribe software for clinical note transcription with the patient, who gave verbal consent to proceed.   ROS: Pertinent ROS in HPI  Studies Reviewed  Lexiscan  Myoview  01/14/2024   Findings are consistent with ischemia. The study is low risk.   No ST deviation was noted.   LV perfusion is abnormal. There is evidence of ischemia. There is no evidence of infarction. Defect 1: There is a small defect with moderate reduction in uptake present in the mid anteroseptal location(s) that is reversible. There is normal wall motion in the defect area. Consistent with ischemia.   Left ventricular function is abnormal. Global function is mildly reduced. Nuclear stress EF: 53%. The left ventricular ejection fraction is mildly decreased (45-54%). End diastolic cavity size is normal. End systolic cavity size is normal.   CT images were obtained for attenuation correction and were examined for the presence of coronary calcium  when appropriate.   Coronary calcium  assessment not performed due to prior revascularization. Aortic atherosclerosis.   Prior study not available for comparison.   Small anterior ischemia with low-normal LVEF.  Low risk due to LVEF above 50% and small territory of ischemia.  Echo 03/17/24 IMPRESSIONS     1. Left ventricular ejection fraction, by estimation, is 55 to 60%. The  left ventricle has normal function. The left ventricle has no regional  wall motion abnormalities. Left ventricular diastolic parameters were  normal.   2. Right ventricular systolic function is normal. The right ventricular  size is normal.   3. Left atrial size was mildly dilated.   4. The mitral valve is normal in structure. Trivial mitral valve  regurgitation. No evidence of mitral stenosis.   5. The aortic valve was not well visualized. Aortic valve regurgitation  is not visualized. Aortic valve sclerosis/calcification is  present,  without any evidence of aortic stenosis.   6. Aortic dilatation noted. There is dilatation of the aortic root,  measuring 41 mm.       Cardiac catheterization 11/13/21 Left Main  Ost LM to Mid LM lesion is 20% stenosed.    Left Anterior Descending  Collaterals  Dist LAD filled by collaterals from RPDA.    Prox LAD to Mid LAD lesion is 100% stenosed. The lesion is chronically occluded. The lesion is calcified. Ultrasound (IVUS) was performed. Severe plaque burden was detected. IVUS has determined that the lesion is calcified.    Ramus Intermedius  Vessel is small. Vessel is angiographically normal.    Left Circumflex  Vessel is small.  Ost Cx to Prox Cx lesion is 50% stenosed.    Right Coronary Artery  Mid RCA to Dist RCA lesion is 20% stenosed.    Right Posterior Atrioventricular Artery  RPAV lesion is 30% stenosed.    Intervention   Prox LAD to Mid LAD lesion  Stent  CATH MACH1 38F CLS4 guide catheter was inserted. Lesion crossed with guidewire using a WIRE ASAHI CONFIANZPRO12 300CM. Pre-stent angioplasty was performed using a BALLN EMERGE MR 2.0X15. A drug-eluting stent was successfully placed using a SYNERGY XD 2.75X28. Stent strut is well apposed. Post-stent angioplasty was performed using a BALLN Snyder EMERGE MR 3.0X12. Maximum pressure: 16 atm.  Stent  A  drug-eluting stent was successfully placed using a SYNERGY XD 3.0X38. Stent strut is well apposed. Stent overlaps previously placed stent. Post-stent angioplasty was performed using a BALLN [place] EMERGE MR 3.0X12. Maximum pressure: 18 atm.  Post-Intervention Lesion Assessment  The intervention was successful. Pre-interventional TIMI flow is 0. Post-intervention TIMI flow is 3. No complications occurred at this lesion.  There is a 0% residual stenosis post intervention.     Coronary Diagrams  Diagnostic Dominance: Right  Intervention     Physical Exam VS:  BP (!) 130/50   Pulse 62   Ht 5' 6 (1.676 m)    Wt 194 lb (88 kg)   SpO2 98%   BMI 31.31 kg/m        Wt Readings from Last 3 Encounters:  03/18/24 194 lb (88 kg)  01/14/24 188 lb (85.3 kg)  01/06/24 188 lb 9.6 oz (85.5 kg)    GEN: Well nourished, well developed in no acute distress NECK: No JVD; No carotid bruits CARDIAC: RRR, no murmurs, rubs, gallops RESPIRATORY:  Clear to auscultation without rales, wheezing or rhonchi  ABDOMEN: Soft, non-tender, non-distended EXTREMITIES:  No edema; No deformity   ASSESSMENT AND PLAN  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 No significant calcification or valve narrowing. Blood pressure management crucial. - Monitor blood pressure, avoid >145/150 mmHg or <100/60 mmHg. - Avoid heavy lifting and bearing down. - Avoid fluoroquinolones. - Schedule follow-up echocardiogram in one year.  Atherosclerotic heart disease of native coronary artery with angina, status post LAD stenting Recent stress test showed small ischemia, normal pump function. No new myocardial infarction. Chest pain resolved with nitroglycerin . - Continue current medications including nitroglycerin  as needed. - Monitor chest pain frequency and severity. - Consider cardiac catheterization if nitroglycerin  use increases. - Ensure nitroglycerin  accessibility, especially during travel.  Essential hypertension Blood pressure generally well-controlled. Maintaining target range crucial for aneurysm prevention. - Monitor blood pressure regularly. - Advised on lifestyle modifications.  Hypercholesterolemia LDL cholesterol controlled at 53 mg/dL. Lipitor effective. - Continue Lipitor. - Lipid panel check in January during primary care visit.  Osteoarthritis of hips and knees Significant pain in hips and knees. Celebrex  use noted, advised against due to kidney and bleeding risks. - Recommended Tylenol  Arthritis for pain. - Suggested topical Voltaren gel. - Advised knee brace and  compression socks for support. - Recommended ice knee sleeve post-exercise.  Lower extremity muscle cramps Intermittent cramps, possibly electrolyte imbalance. Managed with magnesium  and vitamin K. - Continue magnesium  and vitamin K. - Consider electrolyte panel if cramps persist.  Presyncope with postural dizziness Dizziness upon standing, likely due to low diastolic pressure and dehydration. - Advised drinking water before standing in the morning. - Recommended compression socks during busy days.   Dispo: He can follow-up in 6 months with Dr. Jordan  Signed, Orren LOISE Fabry, PA-C

## 2024-03-18 NOTE — Patient Instructions (Signed)
 Medication Instructions:  Your physician recommends that you continue on your current medications as directed. Please refer to the Current Medication list given to you today.   *If you need a refill on your cardiac medications before your next appointment, please call your pharmacy*  Lab Work: - No lab work ordered today. Please plan to have labs done by your primary doctor in January 2026.  Follow-Up: At [facility], you and your health needs are our priority.  As part of our continuing mission to provide you with exceptional heart care, our providers are all part of one team.  This team includes your primary Cardiologist (physician) and Advanced Practice Providers or APPs (Physician Assistants and Nurse Practitioners) who all work together to provide you with the care you need, when you need it.  Your next appointment:   6 month(s) with Dr. Peter Jordan 12 months with Orren Fabry, PA-C  We recommend signing up for the patient portal called MyChart.  Sign up information is provided on this After Visit Summary.  MyChart is used to connect with patients for Virtual Visits (Telemedicine).  Patients are able to view lab/test results, encounter notes, upcoming appointments, etc.  Non-urgent messages can be sent to your provider as well.   To learn more about what you can do with MyChart, go to [email or web address].

## 2024-03-21 ENCOUNTER — Other Ambulatory Visit: Payer: Self-pay | Admitting: Family Medicine

## 2024-03-21 DIAGNOSIS — K219 Gastro-esophageal reflux disease without esophagitis: Secondary | ICD-10-CM

## 2024-06-09 ENCOUNTER — Ambulatory Visit
Admission: EM | Admit: 2024-06-09 | Discharge: 2024-06-09 | Disposition: A | Discharge status: Home / Self Care | Source: Home / Self Care

## 2024-06-09 ENCOUNTER — Encounter: Payer: Self-pay | Admitting: Emergency Medicine

## 2024-06-09 DIAGNOSIS — N401 Enlarged prostate with lower urinary tract symptoms: Secondary | ICD-10-CM

## 2024-06-09 DIAGNOSIS — N3001 Acute cystitis with hematuria: Secondary | ICD-10-CM

## 2024-06-09 DIAGNOSIS — R350 Frequency of micturition: Secondary | ICD-10-CM

## 2024-06-09 LAB — POCT URINE DIPSTICK
Bilirubin, UA: NEGATIVE
Glucose, UA: NEGATIVE mg/dL
Ketones, POC UA: NEGATIVE mg/dL
Nitrite, UA: POSITIVE — AB
Protein Ur, POC: >=300 mg/dL — AB
Spec Grav, UA: 1.025
Urobilinogen, UA: 0.2 U/dL
pH, UA: 5.5

## 2024-06-09 MED ORDER — CEPHALEXIN 500 MG PO CAPS: 500.0000 mg | ORAL_CAPSULE | Freq: Two times a day (BID) | ORAL | 0 refills | Status: AC

## 2024-06-09 NOTE — ED Triage Notes (Signed)
 Pt c/o burning with urination, urinary frequency for 3 days.

## 2024-06-09 NOTE — Discharge Instructions (Addendum)
-  Based on the test that we did today, you have a urinary tract infection. -We will treat this with antibiotics: Keflex  twice daily x7 days. Take with food like breakfast and dinner.  -I am sending a urine culture to the lab.  This means that we are sending a sample of your urine, and will grow bacteria, which will take 2 to 3 days..  Occasionally, this will indicate that we need to change the antibiotic that we prescribed.  We will call you and let you know if we need to change treatment. ---->Follow-up with your primary care if symptoms persist, or go to the emergency department if they worsen, including new mid-back pain, new fevers.

## 2024-06-09 NOTE — ED Provider Notes (Signed)
 " [facility]    CSN: [id number] Arrival date & time: 06/09/24  1139      History   Chief Complaint No chief complaint on file.   HPI Philip Allen is a 84 y.o. male presenting w urinary symptoms. H/o BPH. Last UTI 2002 per pt (per chart review, there was concern for UTI 05/2023, but culture was ultimately negative). Presenting with dysuria, frequency for 3 days. Denies abdominal pain, CVAT, fevers.   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   Hypercalcemia 11/11/2022   Rash 05/18/2022   Hypertension 11/14/2021   Hyperlipidemia with target LDL less than 70 11/14/2021   Osteoarthritis 11/14/2021   CAD (coronary artery disease) 11/13/2021   Angina pectoris    Weakness    Depression    Syncope 08/06/2019    Past Surgical History:  Procedure Laterality Date   CORONARY CTO INTERVENTION N/A 11/13/2021   Procedure: CORONARY CTO INTERVENTION;  Surgeon: Jordan, Peter M, MD;  Location: [facility] INVASIVE CV LAB;  Service: Cardiovascular;  Laterality: N/A;   CORONARY STENT INTERVENTION N/A 10/29/2021   Procedure: CORONARY STENT INTERVENTION;  Surgeon: Jordan, Peter M, MD;  Location: [facility] INVASIVE CV LAB;  Service: Cardiovascular;  Laterality: N/A;   CORONARY STENT INTERVENTION N/A 11/13/2021   Procedure: CORONARY STENT INTERVENTION;  Surgeon: Jordan, Peter M, MD;  Location: [facility] INVASIVE CV LAB;  Service: Cardiovascular;  Laterality: N/A;   CORONARY  ULTRASOUND/IVUS N/A 11/13/2021   Procedure: Intravascular Ultrasound/IVUS;  Surgeon: Jordan, Peter M, MD;  Location: [facility] INVASIVE CV LAB;  Service: Cardiovascular;  Laterality: N/A;   LEFT HEART CATH AND CORONARY ANGIOGRAPHY N/A 10/29/2021   Procedure: LEFT HEART CATH AND CORONARY ANGIOGRAPHY;  Surgeon: Jordan, Peter M, MD;  Location: [facility] INVASIVE CV LAB;  Service: Cardiovascular;  Laterality: N/A;   RIGHT SHOULDER SURGERY  1998   TRANSURETHRAL RESECTION OF PROSTATE  02/05/2012   Procedure: TRANSURETHRAL RESECTION OF THE PROSTATE WITH GYRUS INSTRUMENTS;  Surgeon: Arlena LILLETTE Gal, MD;  Location: [facility];  Service: Urology;  Laterality: N/A;  WITH GYRUS SUPRAPUBIC TUBE OWER TO MAIN       Home Medications    Prior to Admission medications  Medication Sig Start Date End Date Taking? Authorizing Provider  cephALEXin  (KEFLEX ) 500 MG capsule Take 1 capsule (500 mg total) by mouth 2 (two) times daily for 7 days. 06/09/24 06/16/24 Yes Arlyss Leita BRAVO, PA-C  amLODipine  (NORVASC ) 10 MG tablet Take 1 tablet (10 mg total) by mouth daily. 09/01/23 08/26/24  Sebastian Beverley NOVAK, MD  aspirin  EC 81 MG tablet Take 81 mg by mouth daily. Swallow whole.    [provider]  atorvastatin  (LIPITOR) 80 MG tablet TAKE 1 TABLET DAILY 10/08/23   Sebastian Beverley NOVAK, MD  B Complex Vitamins (VITAMIN B COMPLEX ) CAPS Take 1 capsule by mouth daily at 12 noon. 12/24/23 12/18/24  Sebastian Beverley NOVAK, MD  celecoxib  (CELEBREX ) 200 MG capsule  Take 200 mg by mouth 2 (two) times daily.    [provider]  Magnesium  400 MG TABS Take 400 mg by mouth daily.    [provider]  Menatetrenone (VITAMIN K2 ) 100 MCG TABS Take 1 tablet by mouth daily at 12 noon. 12/24/23 12/18/24  Sebastian Beverley NOVAK, MD  nitroGLYCERIN  (NITROSTAT ) 0.4 MG SL tablet Place 1 tablet (0.4 mg total) under the tongue every 5 (five) minutes as needed for chest pain. 09/09/22 03/18/24  Emelia Josefa HERO, NP  pantoprazole  (PROTONIX ) 40 MG  tablet TAKE 1 TABLET DAILY BEFORE BREAKFAST 03/22/24   Sebastian Beverley NOVAK, MD  triamcinolone  cream (KENALOG ) 0.1 % Apply 1 Application topically 2 (two) times daily. Patient taking differently: Apply 1 Application topically as needed (for itch). 05/14/23   Billy Knee, FNP  vitamin E  180 MG (400 UNITS) capsule Take 1 capsule (400 Units total) by mouth 2 (two) times daily. Patient taking differently: Take 400 Units by mouth daily. 12/24/23 12/18/24  Sebastian Beverley NOVAK, MD    Family History Family History  Problem Relation Age of Onset   Heart disease Mother    Heart disease Father     Social History Social History[1]   Allergies   Patient has no active allergies.   Review of Systems Review of Systems  Constitutional:  Negative for appetite change, chills, diaphoresis and fever.  Respiratory:  Negative for shortness of breath.   Cardiovascular:  Negative for chest pain.  Gastrointestinal:  Negative for abdominal pain, blood in stool, constipation, diarrhea, nausea and vomiting.  Genitourinary:  Positive for dysuria and frequency. Negative for decreased urine volume, difficulty urinating, flank pain, genital sores, hematuria, penile discharge, penile pain, penile swelling, scrotal swelling, testicular pain and urgency.  Musculoskeletal:  Negative for back pain.  Neurological:  Negative for dizziness, weakness and light-headedness.  All other systems reviewed and are negative.    Physical Exam Triage Vital Signs ED Triage Vitals  Encounter Vitals Group     BP      Girls Systolic BP Percentile      Girls Diastolic BP Percentile      Boys Systolic BP Percentile      Boys Diastolic BP Percentile      Pulse      Resp      Temp      Temp src      SpO2      Weight      Height      Head Circumference      Peak Flow      Pain Score      Pain Loc      Pain Education      Exclude from Growth Chart    No data found.  Updated Vital Signs BP (!) 185/69 (BP Location: Left Arm)    Pulse 66   Temp (!) 97.5 F (36.4 C) (Oral)   Resp 17   SpO2 95%   Visual Acuity Right Eye Distance:   Left Eye Distance:   Bilateral Distance:    Right Eye Near:   Left Eye Near:    Bilateral Near:     Physical Exam Vitals reviewed.  Constitutional:      General: He is not in acute distress.    Appearance: Normal appearance. He is not ill-appearing.  HENT:     Head: Normocephalic and atraumatic.     Mouth/Throat:     Mouth: Mucous membranes are moist.     Comments: Moist mucous membranes  Eyes:     Extraocular Movements: Extraocular movements intact.     Pupils: Pupils are equal, round, and reactive to light.  Cardiovascular:     Rate and Rhythm: Normal rate and regular rhythm.     Heart sounds: Normal heart sounds.  Pulmonary:     Effort: Pulmonary effort is normal.     Breath sounds: Normal breath sounds. No wheezing, rhonchi or rales.  Abdominal:     General: Bowel sounds are normal. There is no distension.     Palpations: Abdomen is soft. There is no mass.     Tenderness: There is no abdominal tenderness. There is no right CVA tenderness, left CVA tenderness, guarding or rebound.     Comments: No pain to palpation. Comfortable throughout exam. No CVAT.  Skin:    General: Skin is warm.     Capillary Refill: Capillary refill takes less than 2 seconds.     Comments: Good skin turgor  Neurological:     General: No focal deficit present.     Mental Status: He is alert and oriented to person, place, and time.  Psychiatric:        Mood and Affect: Mood normal.        Behavior: Behavior normal.      UC Treatments / Results  Labs (all labs ordered are listed, but only abnormal results are displayed) Labs Reviewed  POCT URINE DIPSTICK - Abnormal; Notable for the following components:      Result Value   Clarity, UA cloudy (*)    Blood, UA large (*)    Protein Ur, POC >=300 (*)    Nitrite, UA Positive (*)    Leukocytes, UA Trace (*)    All other components  within normal limits  URINE CULTURE    EKG   Radiology No results found.  Procedures Procedures (including critical care time)  Medications Ordered in UC Medications - No data to display  Initial Impression / Assessment and Plan / UC Course  I have reviewed the triage vital signs and the nursing notes.  Pertinent labs & imaging results that were available during my care of the patient were reviewed by me and considered in my medical decision making (see chart for details).     Patient is a pleasant 84 y.o. male presenting with acute cystitis with hematuria. The patient is afebrile and nontachycardic.  Antipyretic has not been administered today. H/o BPH. On exam, no abd pain or CVAT.  UA with large blood, positive nitrite, trace leuk. Has not taken Azo. Culture sent.   Creatinine clearance estimated to be 72 mL/min based on 12/2023 CMP, using the Cockcroft-Gault equation.  Keflex  500mg  bid x7 days sent. Return precautions as below.  Final Clinical Impressions(s) / UC Diagnoses   Final diagnoses:  Urinary frequency  Benign prostatic hyperplasia with nocturia  Acute cystitis with hematuria     Discharge Instructions      -Based on the test that we did today, you have a urinary tract infection. -We will treat this with antibiotics: Keflex  twice daily x7 days. Take with food like breakfast and dinner.  -I am sending a urine culture to the lab.  This means that we are sending a sample of your urine, and will grow bacteria, which will take 2 to 3 days..  Occasionally, this will indicate that we need to change the antibiotic that we prescribed.  We will call you and let you know if we need to change treatment. ---->Follow-up with  your primary care if symptoms persist, or go to the emergency department if they worsen, including new mid-back pain, new fevers.     ED Prescriptions     Medication Sig Dispense Auth. Provider   cephALEXin  (KEFLEX ) 500 MG capsule Take 1 capsule  (500 mg total) by mouth 2 (two) times daily for 7 days. 14 capsule Althia Egolf E, PA-C      PDMP not reviewed this encounter.     [1]  Social History Tobacco Use   Smoking status: Never    Passive exposure: Never   Smokeless tobacco: Never  Vaping Use   Vaping status: Never Used  Substance Use Topics   Alcohol use: Yes    Comment: OCCASIONAL    Drug use: No     Arlyss Leita BRAVO, PA-C 06/09/24 1209  "
